# Patient Record
Sex: Female | Born: 1935 | Race: White | Hispanic: No | Smoking: Former smoker
Health system: Southern US, Community
[De-identification: ages and names within clinical notes are randomized; demographics above are authoritative.]

## PROBLEM LIST (undated history)

## (undated) DIAGNOSIS — F419 Anxiety disorder, unspecified: Secondary | ICD-10-CM

## (undated) DIAGNOSIS — D649 Anemia, unspecified: Secondary | ICD-10-CM

## (undated) DIAGNOSIS — K219 Gastro-esophageal reflux disease without esophagitis: Secondary | ICD-10-CM

## (undated) DIAGNOSIS — F0150 Vascular dementia without behavioral disturbance: Secondary | ICD-10-CM

## (undated) DIAGNOSIS — F32A Depression, unspecified: Secondary | ICD-10-CM

## (undated) DIAGNOSIS — Z9289 Personal history of other medical treatment: Secondary | ICD-10-CM

## (undated) DIAGNOSIS — I639 Cerebral infarction, unspecified: Secondary | ICD-10-CM

## (undated) DIAGNOSIS — I219 Acute myocardial infarction, unspecified: Secondary | ICD-10-CM

## (undated) DIAGNOSIS — F329 Major depressive disorder, single episode, unspecified: Secondary | ICD-10-CM

## (undated) HISTORY — PX: CATARACT EXTRACTION W/ INTRAOCULAR LENS IMPLANT: SHX1309

## (undated) HISTORY — PX: FRACTURE SURGERY: SHX138

## (undated) HISTORY — PX: VAGINAL HYSTERECTOMY: SUR661

---

## 2014-07-01 DIAGNOSIS — I639 Cerebral infarction, unspecified: Secondary | ICD-10-CM

## 2014-07-01 HISTORY — DX: Cerebral infarction, unspecified: I63.9

## 2014-07-27 ENCOUNTER — Encounter (HOSPITAL_COMMUNITY): Payer: Self-pay

## 2014-07-27 ENCOUNTER — Emergency Department (HOSPITAL_COMMUNITY): Payer: Medicare Other

## 2014-07-27 ENCOUNTER — Inpatient Hospital Stay (HOSPITAL_COMMUNITY)
Admission: EM | Admit: 2014-07-27 | Discharge: 2014-07-30 | DRG: 069 | Disposition: A | Discharge status: Home Health | Payer: Medicare Other | Attending: Family Medicine | Admitting: Family Medicine

## 2014-07-27 DIAGNOSIS — F0150 Vascular dementia without behavioral disturbance: Secondary | ICD-10-CM | POA: Diagnosis present

## 2014-07-27 DIAGNOSIS — I2510 Atherosclerotic heart disease of native coronary artery without angina pectoris: Secondary | ICD-10-CM | POA: Diagnosis present

## 2014-07-27 DIAGNOSIS — G8194 Hemiplegia, unspecified affecting left nondominant side: Secondary | ICD-10-CM | POA: Diagnosis present

## 2014-07-27 DIAGNOSIS — I252 Old myocardial infarction: Secondary | ICD-10-CM | POA: Diagnosis not present

## 2014-07-27 DIAGNOSIS — R001 Bradycardia, unspecified: Secondary | ICD-10-CM | POA: Diagnosis present

## 2014-07-27 DIAGNOSIS — M6289 Other specified disorders of muscle: Secondary | ICD-10-CM

## 2014-07-27 DIAGNOSIS — Z7982 Long term (current) use of aspirin: Secondary | ICD-10-CM

## 2014-07-27 DIAGNOSIS — G459 Transient cerebral ischemic attack, unspecified: Principal | ICD-10-CM | POA: Diagnosis present

## 2014-07-27 DIAGNOSIS — R531 Weakness: Secondary | ICD-10-CM

## 2014-07-27 DIAGNOSIS — Z66 Do not resuscitate: Secondary | ICD-10-CM | POA: Diagnosis present

## 2014-07-27 DIAGNOSIS — I359 Nonrheumatic aortic valve disorder, unspecified: Secondary | ICD-10-CM | POA: Diagnosis not present

## 2014-07-27 DIAGNOSIS — G451 Carotid artery syndrome (hemispheric): Secondary | ICD-10-CM

## 2014-07-27 LAB — HEMOGLOBIN A1C
Hgb A1c MFr Bld: 5.2 % (ref <5.7)
Mean Plasma Glucose: 103 mg/dL (ref <117)

## 2014-07-27 LAB — CBC WITH DIFFERENTIAL/PLATELET
BASOS ABS: 0 10*3/uL (ref 0.0–0.1)
Basophils Relative: 0 % (ref 0–1)
Eosinophils Absolute: 0.3 10*3/uL (ref 0.0–0.7)
Eosinophils Relative: 4 % (ref 0–5)
HEMATOCRIT: 38.1 % (ref 36.0–46.0)
Hemoglobin: 12.2 g/dL (ref 12.0–15.0)
LYMPHS ABS: 1.2 10*3/uL (ref 0.7–4.0)
LYMPHS PCT: 18 % (ref 12–46)
MCH: 29.3 pg (ref 26.0–34.0)
MCHC: 32 g/dL (ref 30.0–36.0)
MCV: 91.6 fL (ref 78.0–100.0)
MONO ABS: 0.5 10*3/uL (ref 0.1–1.0)
Monocytes Relative: 7 % (ref 3–12)
Neutro Abs: 4.6 10*3/uL (ref 1.7–7.7)
Neutrophils Relative %: 71 % (ref 43–77)
PLATELETS: 258 10*3/uL (ref 150–400)
RBC: 4.16 MIL/uL (ref 3.87–5.11)
RDW: 12.6 % (ref 11.5–15.5)
WBC: 6.5 10*3/uL (ref 4.0–10.5)

## 2014-07-27 LAB — COMPREHENSIVE METABOLIC PANEL
ALT: 10 U/L (ref 0–35)
ANION GAP: 14 (ref 5–15)
AST: 16 U/L (ref 0–37)
Albumin: 3.4 g/dL — ABNORMAL LOW (ref 3.5–5.2)
Alkaline Phosphatase: 102 U/L (ref 39–117)
BUN: 19 mg/dL (ref 6–23)
CO2: 22 meq/L (ref 19–32)
CREATININE: 0.82 mg/dL (ref 0.50–1.10)
Calcium: 9.5 mg/dL (ref 8.4–10.5)
Chloride: 106 mEq/L (ref 96–112)
GFR, EST NON AFRICAN AMERICAN: 80 mL/min — AB (ref >90)
GLUCOSE: 87 mg/dL (ref 70–99)
Potassium: 4.6 mEq/L (ref 3.7–5.3)
SODIUM: 142 meq/L (ref 137–147)
TOTAL PROTEIN: 6.9 g/dL (ref 6.0–8.3)
Total Bilirubin: 0.2 mg/dL — ABNORMAL LOW (ref 0.3–1.2)

## 2014-07-27 LAB — GLUCOSE, CAPILLARY
Glucose-Capillary: 89 mg/dL (ref 70–99)
Glucose-Capillary: 92 mg/dL (ref 70–99)

## 2014-07-27 LAB — PROTIME-INR
INR: 0.94 (ref 0.00–1.49)
Prothrombin Time: 12.7 seconds (ref 11.6–15.2)

## 2014-07-27 LAB — TROPONIN I

## 2014-07-27 MED ORDER — DIAZEPAM 5 MG PO TABS: 5.0000 mg | ORAL_TABLET | Freq: Three times a day (TID) | ORAL | Status: DC | PRN

## 2014-07-27 MED ORDER — ASPIRIN 325 MG PO TABS: 325.0000 mg | ORAL_TABLET | Freq: Every day | ORAL | Status: DC

## 2014-07-27 MED ORDER — LORAZEPAM 2 MG/ML IJ SOLN
1.0000 mg | Freq: Once | INTRAMUSCULAR | Status: AC
  Administered 2014-07-27: 1 mg via INTRAVENOUS
  Filled 2014-07-27: qty 1

## 2014-07-27 MED ORDER — HEPARIN SODIUM (PORCINE) 5000 UNIT/ML IJ SOLN
5000.0000 [IU] | Freq: Three times a day (TID) | INTRAMUSCULAR | Status: DC
  Administered 2014-07-27 – 2014-07-30 (×9): 5000 [IU] via SUBCUTANEOUS
  Filled 2014-07-27 (×9): qty 1

## 2014-07-27 MED ORDER — LORAZEPAM 2 MG/ML IJ SOLN: 1.0000 mg | Freq: Four times a day (QID) | INTRAMUSCULAR | Status: DC | PRN

## 2014-07-27 MED ORDER — HALOPERIDOL LACTATE 5 MG/ML IJ SOLN
2.0000 mg | Freq: Four times a day (QID) | INTRAMUSCULAR | Status: DC | PRN
  Administered 2014-07-27 – 2014-07-28 (×2): 2 mg via INTRAVENOUS
  Filled 2014-07-27 (×2): qty 1

## 2014-07-27 MED ORDER — LORAZEPAM 2 MG/ML IJ SOLN
0.5000 mg | Freq: Once | INTRAMUSCULAR | Status: AC
  Administered 2014-07-27: 0.5 mg via INTRAVENOUS

## 2014-07-27 MED ORDER — SODIUM CHLORIDE 0.9 % IV SOLN: INTRAVENOUS | Status: DC

## 2014-07-27 MED ORDER — ASPIRIN EC 81 MG PO TBEC
81.0000 mg | DELAYED_RELEASE_TABLET | Freq: Every day | ORAL | Status: DC
  Administered 2014-07-28 – 2014-07-30 (×3): 81 mg via ORAL
  Filled 2014-07-27 (×4): qty 1

## 2014-07-27 MED ORDER — ACETAMINOPHEN 325 MG PO TABS: 650.0000 mg | ORAL_TABLET | ORAL | Status: DC | PRN

## 2014-07-27 MED ORDER — TRAZODONE HCL 100 MG PO TABS
100.0000 mg | ORAL_TABLET | Freq: Three times a day (TID) | ORAL | Status: DC
  Administered 2014-07-28: 100 mg via ORAL
  Filled 2014-07-27: qty 1

## 2014-07-27 MED ORDER — LORAZEPAM 2 MG/ML IJ SOLN
0.5000 mg | Freq: Four times a day (QID) | INTRAMUSCULAR | Status: DC | PRN
  Administered 2014-07-27: 0.5 mg via INTRAVENOUS
  Filled 2014-07-27: qty 1

## 2014-07-27 MED ORDER — HYDRALAZINE HCL 20 MG/ML IJ SOLN: 5.0000 mg | Freq: Four times a day (QID) | INTRAMUSCULAR | Status: DC | PRN

## 2014-07-27 MED ORDER — STROKE: EARLY STAGES OF RECOVERY BOOK
Freq: Once | Status: AC
  Administered 2014-07-27: 20:00:00
  Filled 2014-07-27: qty 1

## 2014-07-27 NOTE — ED Provider Notes (Signed)
CSN: [id number]     Arrival date & time 07/27/14  0944 History   First MD Initiated Contact with Patient 07/27/14 [phone]     Chief Complaint  Patient presents with  . Weakness  . Stroke Symptoms     Level V caveat: Dementia  HPI Patient presents with difficulty walking last night as well as this morning.  Family noted this morning that her left side seemed weak.  No prior history of stroke.  They report her left side seems better now.  Patient has a history of dementia and therefore cannot provide significant history.  No recent illness.  No fevers or chills.  Eating and drinking normally.   History reviewed. No pertinent past medical history. No past surgical history on file. No family history on file. History  Substance Use Topics  . Smoking status: Not on file  . Smokeless tobacco: Not on file  . Alcohol Use: Not on file    Review of Systems  Unable to perform ROS     Allergies  Review of patient's allergies indicates no known allergies.  Home Medications   Prior to Admission medications   Medication Sig Start Date End Date Taking? Authorizing Provider  aspirin 325 MG tablet Take 325 mg by mouth daily.   Yes Historical Provider, MD  diazepam (VALIUM) 5 MG tablet Take 5 mg by mouth every 8 (eight) hours as needed for anxiety.   Yes Historical Provider, MD  traZODone (DESYREL) 50 MG tablet Take 100 mg by mouth 3 (three) times daily.   Yes Historical Provider, MD   BP 149/53 mmHg  Pulse 41  Resp 12  SpO2 100% Physical Exam  Constitutional: He appears well-developed and well-nourished.  HENT:  Head: Normocephalic and atraumatic.  No obvious facial droop  Eyes: EOM are normal.  Neck: Normal range of motion.  Cardiovascular: Normal rate, regular rhythm, normal heart sounds and intact distal pulses.   Pulmonary/Chest: Effort normal and breath sounds normal. No respiratory distress.  Abdominal: Soft. He exhibits no distension. There is no tenderness.   Musculoskeletal: He exhibits no edema or tenderness.  Grossly normal strength in her upper and lower extremity major muscle groups.  Neurological: He is alert.  Skin: Skin is warm and dry.  Psychiatric: He has a normal mood and affect. Judgment normal.  Nursing note and vitals reviewed.   ED Course  Procedures (including critical care time) Labs Review Labs Reviewed  CBC WITH DIFFERENTIAL - Abnormal; Notable for the following:    RBC 4.16 (*)    Hemoglobin 12.2 (*)    HCT 38.1 (*)    All other components within normal limits  COMPREHENSIVE METABOLIC PANEL - Abnormal; Notable for the following:    Albumin 3.4 (*)    Total Bilirubin 0.2 (*)    GFR calc non Af Amer 80 (*)    All other components within normal limits  TROPONIN I  PROTIME-INR    Imaging Review No results found.   EKG Interpretation   Date/Time:  Friday July 27 2014 09:59:03 EST Ventricular Rate:  48 PR Interval:  70 QRS Duration: 84 QT Interval:  502 QTC Calculation: 449 R Axis:   56 Text Interpretation:  Sinus bradycardia Short PR interval LVH by voltage  Artifact No old tracing to compare Confirmed by Emmerie Battaglia  MD, Caryn BeeKEVIN ([id number])  on 07/27/2014 11:17:34 AM      MDM   Final diagnoses:  Left-sided weakness  Transient cerebral ischemia, unspecified transient cerebral ischemia type  Family feels strongly the patient was more weak on the left side earlier today.  May represent TIA.  Labs, EKG, head CT.  Suspect neurology consultation and inpatient evaluation for TIA    Lyanne CoKevin M Jaisen Wiltrout, MD 07/27/14 1329

## 2014-07-27 NOTE — H&P (Signed)
Family Medicine [facility] Admission History and Physical Service Pager: [phone]  Patient name: Jill [name] Medical record number: [id number] Date of birth: 1929/03/24 Age: 80 y.o. Gender: female  Primary Care Provider: Jacqlyn KraussMargaret Brewer, NP in [place], [place] Consultants: Neurology  Code Status: DNR  Chief Complaint: Left-sided stiffness and weakness  Assessment and Plan: Jill [name] is a 80 y.o. female presenting with TIA versus seizure . PMH is significant for vascular dementia and hypertension which no longer requires medication.  TIA versus seizure - Symptoms consistent with TA versus partial seizure with Todd's paralysis - Neurology consult in the emergency department, appreciate recommendations - Admit to telemetry for close monitoring and TIA/seizure workup - Start aspirin, check risk labs including A1c, cholesterol panel, and TSH - RN bedside swallow screen - CT head with old tiny left parietal stroke, no acute findings - MRI/MRA brain, TTE, carotid Dopplers, EEG with concern for seizure - Allow for permissive hypertension to 220/120, when necessary hydralazine after that  Dementia, likely vascular type - Family states she has agitation and middle the day controlled with Valium and trazodone - Consider changing Valium to Ativan considering her age, but for now will continue Valium at home dose - Continue trazodone at home no  Hypertension - As above without for permissive hypertension - Patient has history of hypertension but has not needed medication for quite some time - Last measurement 161/77  Bradycardia, CAD - EKG with sinus bradycardia with short PR segment, likely artifact appearing to be repetitive PR's without QRS - Avoid AV nodal blocking agents, monitor on telemetry - History of "minor heart attack" - aspirin, likely statin but will wait for lipid panel considering age  FEN/GI: Normal saline at 75 mL/hr, heart healthy diet after swallow  screen Prophylaxis: Subcutaneous heparin  Disposition: Admit to neuro telemetry for TIA/seizure workup and close monitoring.  History of Present Illness: Jill [name] is a 80 y.o. female presenting with left-sided weakness after a period of left arm and left neck stiffness this morning. Her daughter is in the room who explains her history for her. Patient has advanced dementia and is unable to verbally communicate. Her daughter explains that yesterday was a normal day for her except for an event last night. She states last night she found her mother at the top of the stairs unable to walk, she felt this might be due to exhaustion due to family events yesterday. This morning when she stepped out of bed she was very weak and could not walk. Her daughter helped her to the toilet which was very difficult. On the toilet she noticed that she developed left neck stiffness and left arm stiffness which lasted 5-10 minutes. She helped her to the couch and EMS was called.  Her left-sided stiffness progressed to weakness. She denies any loss of consciousness or change in behavior. She states that when EMS arrived her left side was still weak. Throughout her ER visit this morning her left-sided weakness has slowly improved and now appears to be resolved.  She denies recent fevers or chills. She states that she normally eats and drinks easily but has had decreased by mouth intake for the last day or so with no fluids today. She was [facility] 3 years [facility] for "minor heart attack". She was started on metoprolol that time and had severe bradycardia then, causing its discontinuation.  Review Of Systems: Per HPI, Otherwise 12 point review of systems was performed and was unremarkable.  Patient Active Problem List   Diagnosis Date Noted  . TIA (transient ischemic attack) 07/27/2014   Past Medical History: History reviewed. No pertinent past medical history. Past Surgical History: No past surgical  history on file. Social History: History  Substance Use Topics  . Smoking status: Not on file  . Smokeless tobacco: Not on file  . Alcohol Use: Not on file   Additional social history: Please also refer to relevant sections of EMR.  Family History: No family history on file. Allergies and Medications: No Known Allergies No current facility-administered medications on file prior to encounter.   No current outpatient prescriptions on file prior to encounter.    Objective: BP 154/60 mmHg  Pulse 47  Resp 15  SpO2 98% Exam: Gen: NAD, alert  HEENT: NCAT, MMM but difficult to assess due to patient's lack of interaction CV: RRR, good S1/S2, no murmur Resp: Clear on ant lung fields but decreased breath sounds due to limited exam due to pts' inability to participate GI: SNTND, BS present, no guarding or organomegaly Ext: No edema, warm, 1+ DP pulses Neuro: Alert , cannot follow commands, normal tone BL UE, dropping both arms the patient has similar control BL   Labs and Imaging: CBC BMET   Recent Labs Lab 07/27/14 1154  WBC 6.5  HGB 12.2*  HCT 38.1*  PLT 258    Recent Labs Lab 07/27/14 1154  NA 142  K 4.6  CL 106  CO2 22  BUN 19  CREATININE 0.82  GLUCOSE 87  CALCIUM 9.5     CT head 07/27/2014 Atrophy with small vessel chronic ischemic changes of deep cerebral white matter. Tiny old LEFT parietal cortical infarct. No acute intracranial abnormalities.  EKG 07/27/2014: with sinus bradycardia with short PR segment, likely artifact appearing to be repetitive PR's without QRS  Elenora GammaSamuel L Bradshaw, MD 07/27/2014, 2:34 PM PGY-3, [facility] FPTS Intern pager: [phone], text pages welcome

## 2014-07-27 NOTE — ED Notes (Signed)
Last night ~ 10 pm - difficulty walking - abnormally falling toward rt. Woke up this am still having difficulty walking and not talkitive. Hx. Of dementia. Ems exam; equal strengths, no dysarthia, min. Rt. Facial droop, aphagia. bp 150-160/90's, hr. 50's. cbg 87.  No uti symptoms.

## 2014-07-27 NOTE — ED Notes (Signed)
Pt removed her IV, restarted in same area.

## 2014-07-27 NOTE — Consult Note (Signed)
Referring Physician: Dr. Venora Maples    Chief Complaint: transient left sided weakness, difficulty speaking  HPI:                                                                                                                                         [place] is an 80 y.o. female with dementia who according to her daughter was in her usual state of health until last night when she was noted to have some difficulty speaking. Then, this morning she also had trouble talking and when she went to the toilet couldn't sit on the toilet. Daughter said that she was not able to walk and her left side became" stiff for a couple of minutes and also weak for a longer period of time". She gave her mother an aspirin. Patient has very limited verbal output product of her underlying dementia and thus can not contribute to her history. Daughter tells me that at this moment she seems to be at her baseline, moving the left side without problem.  Date last known well: 11/26 Time last known well: uncertain tPA Given: no, symptoms resolved   History reviewed. No pertinent past medical history.  No past surgical history on file.  No family history on file. Social History:  has no tobacco, alcohol, and drug history on file.  Allergies: No Known Allergies  Medications:                                                                                                                           Scheduled:  ROS: unable to obtain due to dementia                                                                                                                 History obtained from chart review and daughter    Physical exam: pleasant elderly female in no apparent distress. Blood  pressure 154/60, pulse 47, resp. rate 15, SpO2 98 %. Head: normocephalic. Neck: supple, no bruits, no JVD. Cardiac: no murmurs. Lungs: clear. Abdomen: soft, no tender, no mass. Extremities: no edema. Neurologic Examination:                                                                                                       General: Mental Status: Alert and awake but doesn't follows commands and essentially is non verbal. Cranial Nerves: II: Discs flat bilaterally; Visual fields grossly normal, pupils equal, round, reactive to light III,IV, VI: ptosis not present, extra-ocular motions intact bilaterally V,VII: smile symmetric, facial light touch sensation normal bilaterally VIII: hearing grossly normal bilaterally IX,X: gag reflex present XI: bilateral shoulder shrug intact XII: midline tongue extension without atrophy or fasciculations Motor: Moves all limbs symmetrically Sensory: reacts to noxious stimuli Deep Tendon Reflexes:  1 all over Plantars: Right: downgoing   Left: downgoing Cerebellar: Unable to test due to patient inability to follow commands Gait:  Unable to test    Results for orders placed or performed during the hospital encounter of 07/27/14 (from the past 48 hour(s))  CBC with Differential     Status: Abnormal   Collection Time: 07/27/14 11:54 AM  Result Value Ref Range   WBC 6.5 4.0 - 10.5 K/uL   RBC 4.16 (L) 4.22 - 5.81 MIL/uL   Hemoglobin 12.2 (L) 13.0 - 17.0 g/dL   HCT 38.1 (L) 39.0 - 52.0 %   MCV 91.6 78.0 - 100.0 fL   MCH 29.3 26.0 - 34.0 pg   MCHC 32.0 30.0 - 36.0 g/dL   RDW 12.6 11.5 - 15.5 %   Platelets 258 150 - 400 K/uL   Neutrophils Relative % 71 43 - 77 %   Neutro Abs 4.6 1.7 - 7.7 K/uL   Lymphocytes Relative 18 12 - 46 %   Lymphs Abs 1.2 0.7 - 4.0 K/uL   Monocytes Relative 7 3 - 12 %   Monocytes Absolute 0.5 0.1 - 1.0 K/uL   Eosinophils Relative 4 0 - 5 %   Eosinophils Absolute 0.3 0.0 - 0.7 K/uL   Basophils Relative 0 0 - 1 %   Basophils Absolute 0.0 0.0 - 0.1 K/uL  Comprehensive metabolic panel     Status: Abnormal   Collection Time: 07/27/14 11:54 AM  Result Value Ref Range   Sodium 142 137 - 147 mEq/L   Potassium 4.6 3.7 - 5.3 mEq/L   Chloride 106 96 - 112 mEq/L    CO2 22 19 - 32 mEq/L   Glucose, Bld 87 70 - 99 mg/dL   BUN 19 6 - 23 mg/dL   Creatinine, Ser 0.82 0.50 - 1.35 mg/dL   Calcium 9.5 8.4 - 10.5 mg/dL   Total Protein 6.9 6.0 - 8.3 g/dL   Albumin 3.4 (L) 3.5 - 5.2 g/dL   AST 16 0 - 37 U/L   ALT 10 0 - 53 U/L   Alkaline Phosphatase 102 39 - 117 U/L   Total Bilirubin 0.2 (L) 0.3 - 1.2 mg/dL   GFR  calc non Af Amer 80 (L) >90 mL/min   GFR calc Af Amer >90 >90 mL/min    Comment: (NOTE) The eGFR has been calculated using the CKD EPI equation. This calculation has not been validated in all clinical situations. eGFR's persistently <90 mL/min signify possible Chronic Kidney Disease.    Anion gap 14 5 - 15  Troponin I     Status: None   Collection Time: 07/27/14 11:54 AM  Result Value Ref Range   Troponin I <0.30 <0.30 ng/mL    Comment:        Due to the release kinetics of cTnI, a negative result within the first hours of the onset of symptoms does not rule out myocardial infarction with certainty. If myocardial infarction is still suspected, repeat the test at appropriate intervals.    Ct Head Wo Contrast  07/27/2014   CLINICAL DATA:  New LEFT-sided weakness per patient's daughter, questionable TIA, history Alzheimer's ; patient unable to provide additional history, noncommunicative  EXAM: CT HEAD WITHOUT CONTRAST  TECHNIQUE: Contiguous axial images were obtained from the base of the skull through the vertex without intravenous contrast.  COMPARISON:  None  FINDINGS: Generalized atrophy.  Normal ventricular morphology.  No midline shift or mass effect.  Small vessel chronic ischemic changes of deep cerebral white matter.  Tiny old cortical infarct at high LEFT parietal lobe.  No intracranial hemorrhage, mass lesion or evidence acute infarction.  No extra-axial fluid collections.  Extensive atherosclerotic calcifications of BILATERAL internal carotid arteries at carotid siphons.  Bones demineralized.  Minimal scattered mucosal thickening of the  paranasal sinuses.  IMPRESSION: Atrophy with small vessel chronic ischemic changes of deep cerebral white matter.  Tiny old LEFT parietal cortical infarct.  No acute intracranial abnormalities.   Electronically Signed   By: Lavonia Dana M.D.   On: 07/27/2014 11:39    Assessment: 80 y.o. female with advanced dementia, brought in after sustaining transient language impairment x 2 and stiffness/weakness left side. Probable right brain TIA. Focal seizure also in the differential. Will suggest admission to medicine and get EEG, TIA work up. Stroke team will follow up in the morning  Stroke Risk Factors -age  Plan: 1. HgbA1c, fasting lipid panel 2. MRI, MRA  of the brain without contrast 3. Echocardiogram 4. Carotid dopplers 5. Prophylactic therapy-aspirin after passing swallowing evaluation 6. Risk factor modification 7. Telemetry monitoring 8. Frequent neuro checks 9. PT/OT SLP (no needed at this time)  Dorian Pod, MD [facility] [phone]  07/27/2014, 2:14 PM

## 2014-07-27 NOTE — Progress Notes (Signed)
MD notified that patient had been trying to climb out of bed, is impulsive and lacks understanding.  MD ordered 0.5 mg of Ativan IV.  At this time, a sitter is unavailable this RN will sit with patient until she settles.  Will continue to monitor patient.

## 2014-07-28 ENCOUNTER — Inpatient Hospital Stay (HOSPITAL_COMMUNITY): Payer: Medicare Other

## 2014-07-28 DIAGNOSIS — R531 Weakness: Secondary | ICD-10-CM | POA: Insufficient documentation

## 2014-07-28 DIAGNOSIS — F0150 Vascular dementia without behavioral disturbance: Secondary | ICD-10-CM | POA: Insufficient documentation

## 2014-07-28 DIAGNOSIS — F0151 Vascular dementia with behavioral disturbance: Secondary | ICD-10-CM

## 2014-07-28 DIAGNOSIS — I359 Nonrheumatic aortic valve disorder, unspecified: Secondary | ICD-10-CM

## 2014-07-28 LAB — LIPID PANEL
CHOL/HDL RATIO: 2.7 ratio
Cholesterol: 232 mg/dL — ABNORMAL HIGH (ref 0–200)
HDL: 85 mg/dL (ref >39)
LDL CALC: 130 mg/dL — AB (ref 0–99)
TRIGLYCERIDES: 87 mg/dL (ref <150)
VLDL: 17 mg/dL (ref 0–40)

## 2014-07-28 LAB — RAPID URINE DRUG SCREEN, HOSP PERFORMED
Amphetamines: NOT DETECTED
Barbiturates: NOT DETECTED
Benzodiazepines: POSITIVE — AB
Cocaine: NOT DETECTED
OPIATES: NOT DETECTED
Tetrahydrocannabinol: NOT DETECTED

## 2014-07-28 LAB — GLUCOSE, CAPILLARY
Glucose-Capillary: 87 mg/dL (ref 70–99)
Glucose-Capillary: 99 mg/dL (ref 70–99)
Glucose-Capillary: 103 mg/dL — ABNORMAL HIGH (ref 70–99)
Glucose-Capillary: 121 mg/dL — ABNORMAL HIGH (ref 70–99)

## 2014-07-28 MED ORDER — WHITE PETROLATUM GEL
Status: AC
  Administered 2014-07-28: 05:00:00
  Filled 2014-07-28: qty 5

## 2014-07-28 MED ORDER — DIAZEPAM 2 MG PO TABS
2.0000 mg | ORAL_TABLET | Freq: Three times a day (TID) | ORAL | Status: DC | PRN
  Administered 2014-07-28 – 2014-07-30 (×2): 2 mg via ORAL
  Filled 2014-07-28 (×2): qty 1

## 2014-07-28 MED ORDER — TRAZODONE HCL 100 MG PO TABS: 100.0000 mg | ORAL_TABLET | Freq: Three times a day (TID) | ORAL | Status: DC

## 2014-07-28 MED ORDER — ATORVASTATIN CALCIUM 10 MG PO TABS
20.0000 mg | ORAL_TABLET | Freq: Every day | ORAL | Status: DC
  Administered 2014-07-28 – 2014-07-29 (×2): 20 mg via ORAL
  Filled 2014-07-28 (×2): qty 2

## 2014-07-28 MED ORDER — DIAZEPAM 5 MG PO TABS: 5.0000 mg | ORAL_TABLET | Freq: Three times a day (TID) | ORAL | Status: DC | PRN

## 2014-07-28 MED ORDER — TRAZODONE HCL 100 MG PO TABS
100.0000 mg | ORAL_TABLET | Freq: Three times a day (TID) | ORAL | Status: DC
  Administered 2014-07-28 – 2014-07-29 (×3): 100 mg via ORAL
  Filled 2014-07-28 (×3): qty 1

## 2014-07-28 NOTE — Progress Notes (Signed)
STROKE TEAM PROGRESS NOTE   HISTORY Jill [name] is an 80 y.o. female with dementia who according to her daughter was in her usual state of health until last night when she was noted to have some difficulty speaking. Then, this morning she also had trouble talking and when she went to the toilet couldn't sit on the toilet. Daughter said that she was not able to walk and her left side became" stiff for a couple of minutes and also weak for a longer period of time". She gave her mother an aspirin. Patient has very limited verbal output product of her underlying dementia and thus can not contribute to her history. Daughter tells me that at this moment she seems to be at her baseline, moving the left side without problem.  Date last known well: 11/26 Time last known well: uncertain tPA Given: no, symptoms resolved     SUBJECTIVE (INTERVAL HISTORY) Family is at the bedside today. She has vascular dementia and is limited at baseline.  Symptoms appear to have resolved. 2D echo and EEG completed, pending read. She has been on Valium and Trazodone  for behavioral problems.   OBJECTIVE Temp:  [97.4 F (36.3 C)-98.5 F (36.9 C)] 97.4 F (36.3 C) (11/28 0955) Pulse Rate:  [43-75] 52 (11/28 0955) Cardiac Rhythm:  [-] Normal sinus rhythm (11/27 2003) Resp:  [11-16] 15 (11/28 0955) BP: (92-183)/(53-98) 92/53 mmHg (11/28 0955) SpO2:  [97 %-100 %] 100 % (11/28 0955)   Recent Labs Lab 07/27/14 1641 07/27/14 2154 07/28/14 0648  GLUCAP 89 92 87    Recent Labs Lab 07/27/14 1154  NA 142  K 4.6  CL 106  CO2 22  GLUCOSE 87  BUN 19  CREATININE 0.82  CALCIUM 9.5    Recent Labs Lab 07/27/14 1154  AST 16  ALT 10  ALKPHOS 102  BILITOT 0.2*  PROT 6.9  ALBUMIN 3.4*    Recent Labs Lab 07/27/14 1154  WBC 6.5  NEUTROABS 4.6  HGB 12.2  HCT 38.1  MCV 91.6  PLT 258    Recent Labs Lab 07/27/14 1154  TROPONINI <0.30   No results for input(s): LABPROT, INR in the last 72  hours. No results for input(s): COLORURINE, LABSPEC, PHURINE, GLUCOSEU, HGBUR, BILIRUBINUR, KETONESUR, PROTEINUR, UROBILINOGEN, NITRITE, LEUKOCYTESUR in the last 72 hours.  Invalid input(s): APPERANCEUR     Component Value Date/Time   CHOL 232* 07/28/2014 0000   TRIG 87 07/28/2014 0000   HDL 85 07/28/2014 0000   CHOLHDL 2.7 07/28/2014 0000   VLDL 17 07/28/2014 0000   LDLCALC 130* 07/28/2014 0000   Lab Results  Component Value Date   HGBA1C 5.2 07/27/2014   No results found for: LABOPIA, COCAINSCRNUR, LABBENZ, AMPHETMU, THCU, LABBARB  No results for input(s): ETH in the last 168 hours.  Ct Head Wo Contrast 07/27/2014    Atrophy with small vessel chronic ischemic changes of deep cerebral white matter.  Tiny old LEFT parietal cortical infarct.  No acute intracranial abnormalities.     MRI/MRA Right vertebral dominant. Both vertebral arteries patent to the basilar. PICA patent bilaterally. Basilar widely patent. Mild atherosclerotic disease in the superior cerebellar and posterior cerebral arteries bilaterally without significant stenosis.  Mild atherosclerotic disease in the cavernous carotid bilaterally. Mild atherosclerotic disease in the anterior and middle cerebral arteries bilaterally without significant stenosis.  Negative for cerebral aneurysm.  IMPRESSION: Advanced atrophy. Mild chronic microvascular ischemic change  No acute abnormality  Mild intracranial atherosclerotic disease.  2D Echo:  - Moderate  LVH with LVEF 55-60%, grade 1 diastolic dysfunction. Severe posterior MAC with trivial mitral regurgitation. Mild aortic regurgitation. No PFO or ASD. Unable to assess PASP.  EEG - pending   PHYSICAL EXAM  PHYSICAL EXAM Physical exam: Exam: Gen: NAD Eyes: anicteric sclerae, moist conjunctivae                    CV: RRR, no MRG Mental Status: Not alert, grimaces to sternal rub, does not open eyes to command  Neuro: Detailed Neurologic  Exam  Speech:    Non-verbal  Cranial Nerves:    The pupils are equal, round, and reactive to light. Conjugate gaze. Resists eye opening. Could not visualize fundi. Face symmetric.  Motor Observation:    no involuntary movements noted.    Strength:    Moves all limbs symmetrically, appears anti gravity but difficult to assess as not following commands   Sensory: Grimaces and withdraws to pain in all extremities    DTRs: Symmetric bilat    Toes: Downgoing bilat      Gait: Unable to test       ASSESSMENT/PLAN Jill [name] is a 80 y.o. female with history of dementia, hypertension, coronary artery disease, and bradycardia, presenting with difficulty speaking and left hemiparesis. She did not receive IV t-PA due to resolution of deficits.   TIA:  Non-dominant   Resultant  resolution of deficits  MRI  As above  MRA  As above  Carotid Doppler pending  2D Echo  - as above  EEG - pending  LDL 130  HgbA1c 5.2  Subcutaneous heparin for VTE prophylaxis  Diet regular with thin liquids  aspirin 325 mg orally every day prior to admission, now on aspirin 81 mg orally every day  Ongoing aggressive stroke risk factor management  Therapy recommendations:  Pending  Disposition:  Pending  Hypertension  Home meds:  No antihypertensive medications prior to admission.  Stable but low at times 92/53  Hyperlipidemia  Home meds: No lipid lowering medications prior to admission  LDL 132, goal < 70  Now on Lipitor 20 mg daily  Continue statin at discharge  Other Stroke Risk Factors  Advanced age  Hx stroke/TIA  Coronary artery disease    Other Active Problems  Dementia  Other Pertinent History HgbA1c 5.2, not diabetic  Hospital day # 1  Jill Seeavid Rinehuls PA-C [facility] Pager [phone] 07/28/2014, 11:32 AM  Personally examined patient and images, agree with history, physical, neuro exam as stated above. Agree with assessment  and plan.   Jill DeanAntonia Alayla Dethlefs, MD [facility]     To contact Stroke Continuity provider, please refer to [email or web address]. After hours, contact General Neurology

## 2014-07-28 NOTE — Progress Notes (Signed)
EEG completed; results pending.    

## 2014-07-28 NOTE — Progress Notes (Signed)
Family Medicine Teaching Service Daily Progress Note Intern Pager: [phone]  Patient name: Jill HazardVirginia Soberano Medical record number: [id number] Date of birth: December 16, 1928 Age: 80 y.o. Gender: female  Primary Care Provider: No PCP Per Patient Consultants: Neurology Code Status: DNR  Assessment and Plan: Jill [name] is a 80 y.o. female presenting with TIA versus seizure . PMH is significant for vascular dementia and hypertension which no longer requires medication.  TIA versus seizure - Symptoms consistent with TIA versus partial seizure with Todd's paralysis - Neurology consult in the emergency department, appreciate recommendations - continue aspirin, Start lipitor - LDL 130, A1c 5.2 - SLP swallow eval - passed - CT head with old tiny left parietal stroke, no acute findings - MRI/MRA brain, TTE, carotid Dopplers, EEG with concern for seizure - Allow for permissive hypertension to 220/120, PRN hydralazine after that  Dementia, likely vascular type - Family states she has agitation in the middle of the day controlled with Valium and trazodone - Consider changing Valium to Ativan considering her age, but for now will continue Valium at home dose (used IV ativan overnight as she didn't pass her initial swallow screen) - Continue trazodone at home dose  Hypertension - Patient has history of hypertension but has not needed medication for quite some time - Last measurement 130/80  Bradycardia, CAD - EKG with sinus bradycardia with short PR segment, likely artifact appearing to be repetitive PR's without QRS - Avoid AV nodal blocking agents, monitor on telemetry - History of "minor heart attack" - aspirin, statin  FEN/GI: Normal saline at 75 mL/hr, heart healthy diet after swallow screen Prophylaxis: Subcutaneous heparin  Disposition: Continue neuro-tele for eval and monitoring.   Subjective:  No changes overnight, some agitation but patient slept well and is doing well now.    Objective: Temp:  [97.4 F (36.3 C)-98.5 F (36.9 C)] 98.5 F (36.9 C) (11/28 0532) Pulse Rate:  [41-75] 73 (11/28 0532) Resp:  [11-16] 16 (11/28 0532) BP: (101-183)/(53-98) 130/80 mmHg (11/28 0532) SpO2:  [97 %-100 %] 100 % (11/28 0532) Physical Exam: Gen: NAD, alert  HEENT: NCAT, CV: RRR, good S1/S2, no murmur Resp: Clear on ant lung fields but decreased breath sounds due to limited exam due to pts' inability to participate GI: SNTND, BS present, no guarding or organomegaly Ext: No edema, warm Neuro: Alert , cannot follow commands, normal tone BL UE, using BL UE to drink this am  Laboratory:  Recent Labs Lab 07/27/14 1154  WBC 6.5  HGB 12.2  HCT 38.1  PLT 258    Recent Labs Lab 07/27/14 1154  NA 142  K 4.6  CL 106  CO2 22  BUN 19  CREATININE 0.82  CALCIUM 9.5  PROT 6.9  BILITOT 0.2*  ALKPHOS 102  ALT 10  AST 16  GLUCOSE 87    TCholesterol 232, LDL 130 A1C 5.2    Recent Labs Lab 07/27/14 1154  TROPONINI <0.30     Imaging/Diagnostic Tests: CT head 07/27/2014 Atrophy with small vessel chronic ischemic changes of deep cerebral white matter. Tiny old LEFT parietal cortical infarct. No acute intracranial abnormalities.  EKG 07/27/2014: with sinus bradycardia with short PR segment, likely artifact appearing to be repetitive PR's without QRS  Elenora GammaSamuel L Keriana Sarsfield, MD 07/28/2014, 9:13 AM PGY-3, [facility] FPTS Intern pager: [phone], text pages welcome

## 2014-07-28 NOTE — Progress Notes (Signed)
  Echocardiogram 2D Echocardiogram has been performed.  Estelle GrumblesMyers, Harvest Stanco J 07/28/2014, 10:06 AM

## 2014-07-28 NOTE — Progress Notes (Signed)
OT Cancellation Note  Patient Details Name: [name] MRN: [id number] DOB: [birth year]   Cancelled Treatment:    Reason Eval/Treat Not Completed: Other (comment) Per PT, pt sedated for MRI this am and nursing requests to hold right now. Will try back later time.  Lennox LaityStone, [name]  [phone] 07/28/2014, 1:31 PM

## 2014-07-28 NOTE — Progress Notes (Addendum)
VASCULAR LAB PRELIMINARY  PRELIMINARY  PRELIMINARY  PRELIMINARY  Carotid duplex completed.    Preliminary report:  Technically difficult due to movement and patient condition. right:  40-59% internal carotid artery stenosis. Vertebral artery flow is antegrade. Left -  1-39% ICA stenosis.  Vertebral artery flow is antegrade.     Delvis Kau, Tiesha, RVS 07/28/2014, 3:43 PM

## 2014-07-28 NOTE — Progress Notes (Signed)
PT Cancellation Note  Patient Details Name: Jill [name] MRN: [id number] DOB: 01/02/1929   Cancelled Treatment:    Reason Eval/Treat Not Completed: Medical issues which prohibited therapy;Patient at procedure or test/unavailable.  RN reports patient sedated for MRI today.  Will return at later time for PT evaluation.   [name], [name] 07/28/2014, 1:13 PM [name], PT, [facility] Acute Rehab Services Pager [phone]

## 2014-07-28 NOTE — Plan of Care (Signed)
Problem: Consults Goal: Diabetes Guidelines if Diabetic/Glucose > 140 If diabetic or lab glucose is > 140 mg/dl - Initiate Diabetes/Hyperglycemia Guidelines & Document Interventions  Outcome: Not Applicable Date Met:  07/28/14     

## 2014-07-28 NOTE — Plan of Care (Signed)
Problem: Consults Goal: Skin Care Protocol Initiated - if Braden Score 18 or less If consults are not indicated, leave blank or document N/A  Outcome: Not Applicable Date Met:  52/17/47

## 2014-07-28 NOTE — Plan of Care (Signed)
Problem: Consults Goal: Ischemic Stroke Patient Education See Patient Education Module for education specifics.  Outcome: Completed/Met Date Met:  07/28/14

## 2014-07-28 NOTE — Evaluation (Signed)
Clinical/Bedside Swallow Evaluation Patient Details  Name: Jill [name] MRN: [id number] Date of Birth: 1929-08-03  Today's Date: 07/28/2014 Time: 0809-0824 SLP Time Calculation (min) (ACUTE ONLY): 15 min  Past Medical History: History reviewed. No pertinent past medical history. Past Surgical History: No past surgical history on file. HPI:  Jill [name] is an 80 y.o. female with dementia who according to her daughter was in her usual state of health until last night when she was noted to have some difficulty speaking. Then, this morning she also had trouble talking and when she went to the toilet couldn't sit on the toilet. Daughter said that she was not able to walk and her left side became" stiff for a couple of minutes and also weak for a longer period of time". MD suspects TIA, no MRI yet, CT negative.    Assessment / Plan / Recommendation Clinical Impression  Pt demonstrates a mild cognitive based dysphagia, likely consistent with her baseline function. No evidence of aspiration was observed. There is piecemeal oral transit of thin boluses with multiple swallow for each sip and brief oral holding; Pt also has prolonged mastication of pureed foods. Mastication of solids with adequate. Recommend pt resume a regular texture diet with thin liquids with supervision and assist for feeding as needed. No SLP f/u needed at this time.     Aspiration Risk  Mild    Diet Recommendation Regular;Thin liquid   Liquid Administration via: Cup;Straw Medication Administration: Whole meds with liquid Supervision: Staff to assist with self feeding;Full supervision/cueing for compensatory strategies Compensations: Slow rate;Small sips/bites Postural Changes and/or Swallow Maneuvers: Seated upright 90 degrees    Other  Recommendations Oral Care Recommendations: Oral care BID   Follow Up Recommendations  None    Frequency and Duration        Pertinent Vitals/Pain NA    SLP Swallow Goals      Swallow Study Prior Functional Status       General HPI: Jill [name] is an 80 y.o. female with dementia who according to her daughter was in her usual state of health until last night when she was noted to have some difficulty speaking. Then, this morning she also had trouble talking and when she went to the toilet couldn't sit on the toilet. Daughter said that she was not able to walk and her left side became" stiff for a couple of minutes and also weak for a longer period of time". MD suspects TIA, no MRI yet, CT negative.  Type of Study: Bedside swallow evaluation Previous Swallow Assessment: none Diet Prior to this Study: NPO Temperature Spikes Noted: No Respiratory Status: Room air History of Recent Intubation: No Behavior/Cognition: Alert;Doesn't follow directions Oral Cavity - Dentition: Adequate natural dentition Self-Feeding Abilities: Needs assist Patient Positioning: Upright in bed Baseline Vocal Quality: Low vocal intensity Volitional Cough: Cognitively unable to elicit Volitional Swallow: Unable to elicit    Oral/Motor/Sensory Function Overall Oral Motor/Sensory Function: Other (comment) (does not f/c, no focal weakness observed)   Ice Chips     Thin Liquid Thin Liquid: Impaired Presentation: Cup;Straw;Self Fed Oral Phase Impairments: Impaired anterior to posterior transit Oral Phase Functional Implications: Prolonged oral transit Pharyngeal  Phase Impairments: Multiple swallows    Nectar Thick Nectar Thick Liquid: Not tested   Honey Thick Honey Thick Liquid: Not tested   Puree Puree: Impaired Presentation: Spoon Oral Phase Impairments: Impaired anterior to posterior transit Oral Phase Functional Implications: Prolonged oral transit   Solid   GO    Solid:  Within functional limits      [facility] Surgery Center PcBonnie Zarie Kosiba, MA CCC-SLP [phone]  Claudine MoutonDeBlois, Keimya Briddell Caroline 07/28/2014,8:28 AM

## 2014-07-29 DIAGNOSIS — R531 Weakness: Secondary | ICD-10-CM | POA: Insufficient documentation

## 2014-07-29 LAB — GLUCOSE, CAPILLARY
Glucose-Capillary: 128 mg/dL — ABNORMAL HIGH (ref 70–99)
Glucose-Capillary: 103 mg/dL — ABNORMAL HIGH (ref 70–99)
Glucose-Capillary: 113 mg/dL — ABNORMAL HIGH (ref 70–99)
Glucose-Capillary: 122 mg/dL — ABNORMAL HIGH (ref 70–99)

## 2014-07-29 LAB — TSH: TSH: 2.33 u[IU]/mL (ref 0.350–4.500)

## 2014-07-29 MED ORDER — HALOPERIDOL 1 MG PO TABS
0.5000 mg | ORAL_TABLET | Freq: Four times a day (QID) | ORAL | Status: DC | PRN
  Administered 2014-07-30: 0.5 mg via ORAL
  Filled 2014-07-29: qty 1

## 2014-07-29 MED ORDER — TRAZODONE HCL 100 MG PO TABS: 100.0000 mg | ORAL_TABLET | Freq: Every evening | ORAL | Status: DC | PRN

## 2014-07-29 MED ORDER — TRAZODONE HCL 100 MG PO TABS: 100.0000 mg | ORAL_TABLET | Freq: Two times a day (BID) | ORAL | Status: DC | PRN

## 2014-07-29 NOTE — Progress Notes (Signed)
STROKE TEAM PROGRESS NOTE   HISTORY Jill [name] is an 80 y.o. female with dementia who according to her daughter was in her usual state of health until last night when she was noted to have some difficulty speaking. Then, this morning she also had trouble talking and when she went to the toilet couldn't sit on the toilet. Daughter said that she was not able to walk and her left side became" stiff for a couple of minutes and also weak for a longer period of time". She gave her mother an aspirin. Patient has very limited verbal output product of her underlying dementia and thus can not contribute to her history. Daughter tells me that at this moment she seems to be at her baseline, moving the left side without problem.  Date last known well: 11/26 Time last known well: uncertain tPA Given: no, symptoms resolved     SUBJECTIVE (INTERVAL HISTORY): No events overnight. Family at bedside. PT at bedside. She has vascular dementia and is limited at baseline. Symptoms appear to have resolved. She has been on Valium and Trazodone for behavioral problems.     OBJECTIVE Temp:  [87.7 F (30.9 C)-98.4 F (36.9 C)] 97.5 F (36.4 C) (11/29 0920) Pulse Rate:  [43-71] 57 (11/29 0920) Cardiac Rhythm:  [-] Sinus bradycardia (11/29 0600) Resp:  [14-17] 16 (11/29 0920) BP: (99-150)/(41-82) 108/41 mmHg (11/29 0920) SpO2:  [93 %-100 %] 93 % (11/29 0920)   Recent Labs Lab 07/28/14 0648 07/28/14 1104 07/28/14 1629 07/28/14 2111 07/29/14 0633  GLUCAP 87 99 103* 121* 128*    Recent Labs Lab 07/27/14 1154  NA 142  K 4.6  CL 106  CO2 22  GLUCOSE 87  BUN 19  CREATININE 0.82  CALCIUM 9.5    Recent Labs Lab 07/27/14 1154  AST 16  ALT 10  ALKPHOS 102  BILITOT 0.2*  PROT 6.9  ALBUMIN 3.4*    Recent Labs Lab 07/27/14 1154  WBC 6.5  NEUTROABS 4.6  HGB 12.2  HCT 38.1  MCV 91.6  PLT 258    Recent Labs Lab 07/27/14 1154  TROPONINI <0.30   No results for input(s): LABPROT,  INR in the last 72 hours. No results for input(s): COLORURINE, LABSPEC, PHURINE, GLUCOSEU, HGBUR, BILIRUBINUR, KETONESUR, PROTEINUR, UROBILINOGEN, NITRITE, LEUKOCYTESUR in the last 72 hours.  Invalid input(s): APPERANCEUR     Component Value Date/Time   CHOL 232* 07/28/2014 0000   TRIG 87 07/28/2014 0000   HDL 85 07/28/2014 0000   CHOLHDL 2.7 07/28/2014 0000   VLDL 17 07/28/2014 0000   LDLCALC 130* 07/28/2014 0000   Lab Results  Component Value Date   HGBA1C 5.2 07/27/2014      Component Value Date/Time   LABOPIA NONE DETECTED 07/28/2014 1340   COCAINSCRNUR NONE DETECTED 07/28/2014 1340   LABBENZ POSITIVE* 07/28/2014 1340   AMPHETMU NONE DETECTED 07/28/2014 1340   THCU NONE DETECTED 07/28/2014 1340   LABBARB NONE DETECTED 07/28/2014 1340    No results for input(s): ETH in the last 168 hours.  Ct Head Wo Contrast 07/27/2014    Atrophy with small vessel chronic ischemic changes of deep cerebral white matter.  Tiny old LEFT parietal cortical infarct.  No acute intracranial abnormalities.     MRI/MRA. 07/28/2014 Advanced atrophy. Mild chronic microvascular ischemic change No acute abnormality Mild intracranial atherosclerotic disease.  2D Echo: 07/28/2014 - Moderate LVH with LVEF 55-60%, grade 1 diastolic dysfunction. Severe posterior MAC with trivial mitral regurgitation. Mild aortic regurgitation. No PFO or ASD.  Unable to assess PASP.  EEG - pending  Carotid Doppler 07/28/2014 Preliminary report - Technically difficult due to movement and patient condition. right: 40-59% internal carotid artery stenosis. Vertebral artery flow is antegrade. Left - 1-39% ICA stenosis. Vertebral artery flow is antegrade.    PHYSICAL EXAM  PHYSICAL EXAM Physical exam: Exam: Gen: NAD Eyes: anicteric sclerae, moist conjunctivae                    CV: RRR, no MRG Mental Status: More alert today, sitting on the side of the bed, eyes open but not following commands or  interacting  Neuro: Detailed Neurologic Exam  Speech:    Non-verbal  Cranial Nerves:    The pupils are equal, round, and reactive to light. Conjugate gaze. Could not visualize fundi. Face symmetric.  Motor Observation:    no involuntary movements noted.    Strength:    Moves all limbs symmetrically, appears anti gravity but difficult to assess as not following commands   Sensory: Grimaces to pain in all extremities    DTRs: Symmetric bilat    Toes: Downgoing bilat      Gait: Unable to test, physical therapy trying to get her out of bed today       ASSESSMENT/PLAN Ms. Jill HazardVirginia [name] is a 80 y.o. female with history of dementia, hypertension, coronary artery disease, and bradycardia, presenting with difficulty speaking and left hemiparesis. She did not receive IV t-PA due to resolution of deficits.   TIA:  Non-dominant   Resultant  resolution of deficits  MRI  As above - no acute abnormality  MRA  As above - no large vessel stenosis.  Carotid Doppler - 40-59% right internal carotid artery stenosis.  2D Echo  - as above - unremarkable  EEG - pending  LDL 130  HgbA1c 5.2  Subcutaneous heparin for VTE prophylaxis  Diet regular with thin liquids  aspirin 325 mg orally every day prior to admission, now on aspirin 81 mg orally every day. Can consider switching to Plavix if EEG is negative.   Ongoing aggressive stroke risk factor management  Therapy recommendations: Home health therapy with 24-hour per day supervision. Family declined SNF.  Disposition:  Pending  Hypertension  Home meds:  No antihypertensive medications prior to admission.  Stable but low at times 92/53  Hyperlipidemia  Home meds: No lipid lowering medications prior to admission  LDL 132, goal < 70  Now on Lipitor 20 mg daily  Continue statin at discharge  Other Stroke Risk Factors  Advanced age  Hx stroke/TIA  Coronary artery disease  Other Active  Problems  Dementia  Other Pertinent History HgbA1c 5.2, not diabetic  The stroke service will sign off at this time. Please contact us if you need further assistance.   Hospital day # 2  Delton SeeDavid Rinehuls PA-C [facility] Pager [phone] 07/29/2014, 10:45 AM  Personally examined patient and images, agree with history, physical, neuro exam as stated above. Agree with assessment and plan.   Jill DeanAntonia Miabella Shannahan, MD [facility]        To contact Stroke Continuity provider, please refer to [email or web address]. After hours, contact General Neurology

## 2014-07-29 NOTE — Progress Notes (Signed)
Physical Therapy Evaluation Patient Details Name: Jill [name] MRN: [id number] DOB: Mar 10, 1929 Today's Date: 07/29/2014   History of Present Illness  Patient is an 80 yo female admitted 07/27/14 with Lt sided weakness/stiffness for w/u of TIA vs seizures.  PMH:  dementia (non-verbal), HTN, CAD, bradycardia.  Clinical Impression  Patient presents with problems listed below. Will benefit from acute PT to maximize functional mobility prior to discharge home with daughter.  Patient very lethargic today - eyes closed through most of session.  Was able to move herself back to supine at end of session.  Recommend HHPT for continued therapy and HH Aide for assist with ADL's at discharge.  Patient would also benefit from w/c for safe mobility in the house.    Follow Up Recommendations Home health PT;Supervision/Assistance - 24 hour (Daughter declining SNF at this time.  Needs HH Aide)    Equipment Recommendations  Wheelchair (measurements PT);Wheelchair cushion (measurements PT)    Recommendations for Other Services       Precautions / Restrictions Precautions Precautions: Fall Restrictions Weight Bearing Restrictions: No      Mobility  Bed Mobility Overal bed mobility: Needs Assistance Bed Mobility: Supine to Sit;Sit to Supine     Supine to sit: Total assist Sit to supine: Mod assist   General bed mobility comments: Verbal and tactile cues to initiate movement.  Patient very lethargic.  Provided total assist to move patient to sitting at EOB.  Once upright, patient opened eyes intermittently.  Required min assist to maintain sitting balance.  Was able to maintain sitting balance for approximately 10 seconds before drifting toward left side.  Patient spontaneously turning head to right and left side.  Patient at EOB x 12 minutes.  Patient then initiated moving back to supine.  Assist to control trunk down to bed.  Patient able to lift LE's onto bed.  Required increased time for this  transition.  Transfers Overall transfer level: Needs assistance Equipment used: 1 person hand held assist Transfers: Sit to/from Stand Sit to Stand: Max assist         General transfer comment: From sitting position in front of patient, PT facilitated transition from sit > stand at hips and trunk. Patient cleared hips from bed, but did not complete stance up to fully upright position.  Attempted x3.  Ambulation/Gait                Stairs            Wheelchair Mobility    Modified Rankin (Stroke Patients Only) Modified Rankin (Stroke Patients Only) Pre-Morbid Rankin Score: Moderate disability Modified Rankin: Severe disability (primarily due to dementia/lethargy)     Balance Overall balance assessment: Needs assistance Sitting-balance support: Single extremity supported;Feet supported Sitting balance-Leahy Scale: Poor Sitting balance - Comments: Required min assist to maintain balance. Postural control: Left lateral lean;Posterior lean Standing balance support: Bilateral upper extremity supported Standing balance-Leahy Scale: Zero Standing balance comment: Unable to reach standing position                             Pertinent Vitals/Pain Pain Assessment:  (PAINAD score = 0)    Home Living Family/patient expects to be discharged to:: Private residence Living Arrangements: Children (Daughter and grandchildren) Available Help at Discharge: Family;Available 24 hours/day (Daughter providing 24/7 care with assist of her children) Type of Home: House Home Access: Level entry     Home Layout: Two level;Able to live on  main level with bedroom/bathroom Home Equipment: Bedside commode      Prior Function Level of Independence: Independent;Needs assistance   Gait / Transfers Assistance Needed: Patient was independent with ambulation pta.  No falls per daughter.  Reports [place] and then come back inside throughout  the day.  ADL's / Homemaking Assistance Needed: Assist with bathing and dressing.  Total assist with meal prep and housekeeping        Hand Dominance        Extremity/Trunk Assessment   Upper Extremity Assessment: Generalized weakness;Difficult to assess due to impaired cognition (Noted minimal voluntary movement BUE's)           Lower Extremity Assessment: Generalized weakness;Difficult to assess due to impaired cognition (Increased extensor rigidity.  Able to lift LE's against grav)      Cervical / Trunk Assessment: Kyphotic  Communication   Communication: Receptive difficulties;Expressive difficulties  Cognition Arousal/Alertness: Lethargic (Question due to meds.  Dtr reports pt usually is alert in pm) Behavior During Therapy: Flat affect Overall Cognitive Status: Impaired/Different from baseline Area of Impairment: Attention;Following commands   Current Attention Level:  (Decreased arousal.  Eyes closed through most of session)   Following Commands:  (Not following one-step commands this am)            General Comments      Exercises        Assessment/Plan    PT Assessment Patient needs continued PT services  PT Diagnosis Difficulty walking;Generalized weakness;Altered mental status   PT Problem List Decreased strength;Decreased activity tolerance;Decreased balance;Decreased mobility;Decreased coordination;Decreased cognition;Impaired tone  PT Treatment Interventions DME instruction;Gait training;Functional mobility training;Therapeutic activities;Patient/family education   PT Goals (Current goals can be found in the Care Plan section) Acute Rehab PT Goals Patient Stated Goal: Unable to state.  Daughter's goal is to take patient home. PT Goal Formulation: With family Time For Goal Achievement: 08/05/14 Potential to Achieve Goals: Fair    Frequency Min 3X/week   Barriers to discharge        Co-evaluation               End of Session  Equipment Utilized During Treatment: Gait belt Activity Tolerance: Patient limited by lethargy Patient left: in bed;with call bell/phone within reach;with bed alarm set;with family/visitor present Nurse Communication: Mobility status         Time: [id number] PT Time Calculation (min) (ACUTE ONLY): 38 min   Charges:   PT Evaluation $Initial PT Evaluation Tier I: 1 Procedure PT Treatments $Therapeutic Activity: 23-37 mins   PT G CodesVena Austria:          Mikaeel Petrow H 07/29/2014, 10:31 AM [name], PT, [facility] Acute Rehab Services Pager [phone]

## 2014-07-29 NOTE — Progress Notes (Signed)
Family Medicine Teaching Service Daily Progress Note Intern Pager: [phone]  Patient name: Jill HazardVirginia Goecke Medical record number: [id number] Date of birth: 04-19-1929 Age: 80 y.o. Gender: female  Primary Care Provider: No PCP Per Patient Consultants: Neurology Code Status: DNR  Assessment and Plan: Jill [name] is a 80 y.o. female presenting with TIA versus seizure . PMH is significant for vascular dementia and hypertension which no longer requires medication.  TIA. Symptoms consistent with TIA versus partial seizure with Todd's paralysis - Neurology consulted in the emergency department, appreciate recommendations - continue aspirin, Start lipitor here - LDL 130, A1c 5.2 - CT head with old tiny left parietal stroke, no acute findings - MRI/MRA brain: Advanced atrophy, mild chronic microvascular ischemic change - TTE 55-60% EF, mild MR/AR  - Carotid Dopplers: Right 40-59% ICA stenosis, Left 1-39% ICA stenosis - F/u EEG with concern for seizure - Allow for permissive hypertension to 220/120, PRN hydralazine after that - PT recs: Home Health PT with 24 hour assistance, family declines SNF at this time  Dementia, likely vascular type.  Family states she has agitation in the middle of the day controlled with Valium and trazodone - Wean home valium to 2 tid prn, will plan for completely weaning off as outpatient - Wean trazodone to qhs prn dosing - Haldol 0.5mg  q6hrs prn agitation  Bradycardia, CAD - EKG with sinus bradycardia with short PR segment, likely artifact appearing to be repetitive PR's without QRS - Avoid AV nodal blocking agents, monitor on telemetry - History of "minor heart attack" - Continue aspirin, statin  Hypertension - Patient has history of hypertension but has not needed medication for quite some time  FEN/GI: Normal saline at 75 mL/hr, Regular diet Prophylaxis: Subcutaneous heparin  Dis[facility]: [facility] evaluation.    Subjective:  NAEON. Patient sleeping this morning. Able to wake up and eat breakfast.   Objective: Temp:  [87.7 F (30.9 C)-98.4 F (36.9 C)] 97.5 F (36.4 C) (11/29 0500) Pulse Rate:  [43-71] 43 (11/29 0500) Resp:  [14-17] 14 (11/29 0500) BP: (92-150)/(47-82) 129/65 mmHg (11/29 0500) SpO2:  [95 %-100 %] 95 % (11/29 0500) Physical Exam: Gen: NAD, sleeping in bed HEENT: NCAT, CV: Bradycardic, regular rhythm, no murmurs appreciated Resp: Clear on ant lung fields but decreased breath sounds due to limited exam due to pts' inability to participate GI: SNTND, BS present, no guarding or organomegaly Ext: No edema, warm Neuro: Sleeping, arouses to tactile stimuli, cannot follow commands, normal tone BL UE/LE  Laboratory:  Recent Labs Lab 07/27/14 1154  WBC 6.5  HGB 12.2  HCT 38.1  PLT 258    Recent Labs Lab 07/27/14 1154  NA 142  K 4.6  CL 106  CO2 22  BUN 19  CREATININE 0.82  CALCIUM 9.5  PROT 6.9  BILITOT 0.2*  ALKPHOS 102  ALT 10  AST 16  GLUCOSE 87    TCholesterol 232, LDL 130 A1C 5.2     Recent Labs Lab 07/27/14 1154  TROPONINI <0.30     Imaging/Diagnostic Tests: CT head 07/27/2014 Atrophy with small vessel chronic ischemic changes of deep cerebral white matter. Tiny old LEFT parietal cortical infarct. No acute intracranial abnormalities.  EKG 07/27/2014: with sinus bradycardia with short PR segment, likely artifact appearing to be repetitive PR's without QRS  Jacquiline Doealeb Parker, MD 07/29/2014, 8:58 AM PGY-1, [facility] FPTS Intern pager: [phone], text pages welcome

## 2014-07-30 DIAGNOSIS — F0150 Vascular dementia without behavioral disturbance: Secondary | ICD-10-CM

## 2014-07-30 LAB — GLUCOSE, CAPILLARY
GLUCOSE-CAPILLARY: 144 mg/dL — AB (ref 70–99)
Glucose-Capillary: 103 mg/dL — ABNORMAL HIGH (ref 70–99)
Glucose-Capillary: 85 mg/dL (ref 70–99)

## 2014-07-30 MED ORDER — ASPIRIN 81 MG PO TBEC: 81.0000 mg | DELAYED_RELEASE_TABLET | Freq: Every day | ORAL | Status: DC

## 2014-07-30 MED ORDER — TRAZODONE HCL 50 MG PO TABS: 100.0000 mg | ORAL_TABLET | Freq: Every evening | ORAL | Status: DC | PRN

## 2014-07-30 MED ORDER — HALOPERIDOL 0.5 MG PO TABS: 0.5000 mg | ORAL_TABLET | Freq: Four times a day (QID) | ORAL | Status: DC | PRN

## 2014-07-30 MED ORDER — ATORVASTATIN CALCIUM 20 MG PO TABS: 20.0000 mg | ORAL_TABLET | Freq: Every day | ORAL | Status: DC

## 2014-07-30 MED ORDER — DIAZEPAM 2 MG PO TABS: 2.0000 mg | ORAL_TABLET | Freq: Three times a day (TID) | ORAL | Status: DC | PRN

## 2014-07-30 NOTE — Progress Notes (Signed)
Occupational Therapy Evaluation Patient Details Name: Jill [name] MRN: [id number] DOB: 12-12-1928 Today's Date: 07/30/2014    History of Present Illness Patient is an 80 yo female admitted 07/27/14 with Lt sided weakness/stiffness for w/u of TIA vs seizures.  PMH:  dementia (non-verbal), HTN, CAD, bradycardia.   Clinical Impression   Patient able to complete 2 ADL tasks with set up and verbal/visual cues provided. Pt required 2 person hand-held assist for transfers and ambulation. Recommending HHOT and 24/7 supervision/assistance at home.      Follow Up Recommendations  Home health OT;Supervision/Assistance - 24 hour    Equipment Recommendations  Other (comment) (TBD when family is present)    Recommendations for Other Services       Precautions / Restrictions Precautions Precautions: Fall Restrictions Weight Bearing Restrictions: No      Mobility Bed Mobility Overal bed mobility: Needs Assistance Bed Mobility: Supine to Sit;Sit to Supine     Supine to sit: Total assist     General bed mobility comments: Pt in chair on OT arrival  Transfers Overall transfer level: Needs assistance Equipment used: 2 person hand held assist Transfers: Sit to/from Stand Sit to Stand: Min assist;+2 safety/equipment         General transfer comment: Min (A) for boost to standing and to maintain balance.    Balance Overall balance assessment: Needs assistance Sitting-balance support: Single extremity supported;Feet supported Sitting balance-Leahy Scale: Fair     Standing balance support: Bilateral upper extremity supported;During functional activity Standing balance-Leahy Scale: Poor                              ADL Overall ADL's : Needs assistance/impaired     Grooming: Wash/dry hands;Wash/dry face;Oral care;Brushing hair;Minimal assistance;Cueing for sequencing;Sitting Grooming Details (indicate cue type and reason): Hand-over-hand assist in attempts to  initiate oral care, but without success. Provided pt with comb and pt brushed hair independently. Provided pt with warm wash cloth and pt completed face washing with verbal and visual cues.                 Toilet Transfer: Minimal assistance;+2 for safety/equipment;Cueing for sequencing;Ambulation;BSC Toilet Transfer Details (indicate cue type and reason): Min (A) to place hands [facility] and provide support for safe descend         Functional mobility during ADLs: Minimal assistance;+2 for safety/equipment;Cueing for sequencing General ADL Comments: Presented 3 (toothbrush, comb, wash cloth) ADL items and provided hand-over-hand assist to intiate task. Pt able to complete hair brushing and face washing with verbal and visual cues. Pt required hand-held assist for ambulation into and out of bathroom. Pt with minimal verbal output during session     Vision                     Perception     Praxis      Pertinent Vitals/Pain Pain Assessment: Faces Faces Pain Scale: No hurt     Hand Dominance     Extremity/Trunk Assessment Upper Extremity Assessment Upper Extremity Assessment: Generalized weakness;Difficult to assess due to impaired cognition   Lower Extremity Assessment Lower Extremity Assessment: Generalized weakness;Difficult to assess due to impaired cognition   Cervical / Trunk Assessment Cervical / Trunk Assessment: Kyphotic   Communication Communication Communication: Receptive difficulties;Expressive difficulties   Cognition Arousal/Alertness: Awake/alert Behavior During Therapy: Flat affect Overall Cognitive Status: Impaired/Different from baseline Area of Impairment: Attention;Following commands   Current Attention Level: Focused  Following Commands: Follows one step commands inconsistently;Follows one step commands with increased time       General Comments: Patient with dementia at baseline   General Comments       Exercises        Shoulder Instructions      Home Living                                   Additional Comments: Pt with dementia and is non-verbal. No family at bedside to provide more home information.      Prior Functioning/Environment               OT Diagnosis: Generalized weakness;Cognitive deficits   OT Problem List: Decreased strength;Decreased range of motion;Decreased activity tolerance;Impaired balance (sitting and/or standing);Decreased coordination;Decreased cognition;Decreased safety awareness;Decreased knowledge of use of DME or AE   OT Treatment/Interventions: Self-care/ADL training;Therapeutic exercise;DME and/or AE instruction;Therapeutic activities;Patient/family education;Balance training    OT Goals(Current goals can be found in the care plan section) Acute Rehab OT Goals Patient Stated Goal: none stated OT Goal Formulation: Patient unable to participate in goal setting Time For Goal Achievement: 08/13/14 Potential to Achieve Goals: Good ADL Goals Pt Will Perform Grooming: with set-up;sitting;with caregiver independent in assisting Pt Will Perform Upper Body Dressing: with set-up;sitting;with caregiver independent in assisting Pt Will Perform Lower Body Dressing: with caregiver independent in assisting;with supervision;sit to/from stand Pt Will Transfer to Toilet: ambulating;bedside commode;with min guard assist Pt Will Perform Toileting - Clothing Manipulation and hygiene: with min guard assist;with caregiver independent in assisting;sit to/from stand  OT Frequency: Min 2X/week   Barriers to D/C:            Co-evaluation              End of Session Equipment Utilized During Treatment: Gait belt Nurse Communication: Mobility status;Precautions  Activity Tolerance: Patient tolerated treatment well Patient left: in chair;with call bell/phone within reach;with chair alarm set   Time: [id number] OT Time Calculation (min): 20 min Charges:     G-Codes:    Nils PyleBermel, Elizeo Rodriques 07/30/2014, 1:23 PM

## 2014-07-30 NOTE — Progress Notes (Signed)
Family Medicine Teaching Service Daily Progress Note Intern Pager: [phone]  Patient name: Jill HazardVirginia Lacina Medical record number: [id number] Date of birth: [birth year] Age: 80 y.o. Gender: female  Primary Care Provider: No PCP Per Patient Consultants: Neurology Code Status: DNR  Assessment and Plan: Jill [name] is a 80 y.o. female presenting with TIA versus seizure . PMH is significant for vascular dementia and hypertension which no longer requires medication.  TIA. Symptoms consistent with TIA versus partial seizure with Todd's paralysis - Neurology consulted in the emergency department, appreciate recommendations - continue aspirin, Start lipitor here - LDL 130, A1c 5.2 - CT head with old tiny left parietal stroke, no acute findings - MRI/MRA brain: Advanced atrophy, mild chronic microvascular ischemic change - TTE 55-60% EF, mild MR/AR  - Carotid Dopplers: Right 40-59% ICA stenosis, Left 1-39% ICA stenosis - F/u EEG with concern for seizure - Allow for permissive hypertension to 220/120, PRN hydralazine after that - PT recs: Home Health PT with 24 hour assistance, family declines SNF at this time  Dementia, likely vascular type.  Family states she has agitation in the middle of the day controlled with Valium and trazodone - Wean home valium to 2 tid prn, will plan for completely weaning off as outpatient - Wean trazodone to qhs prn dosing - Haldol 0.5mg  q6hrs prn agitation  Bradycardia, CAD - EKG with sinus bradycardia with short PR segment, likely artifact appearing to be repetitive PR's without QRS - Avoid AV nodal blocking agents, monitor on telemetry - History of "minor heart attack" - Continue aspirin, statin  Hypertension - Patient has history of hypertension but has not needed medication for quite some time  FEN/GI: Normal saline at 75 mL/hr, Regular diet Prophylaxis: Subcutaneous heparin  Disposition: [facility]   Subjective:  NAEON.  Patient sleeping this morning.   Objective: Temp:  [97.5 F (36.4 C)-98 F (36.7 C)] 97.5 F (36.4 C) (11/30 0545) Pulse Rate:  [43-88] 43 (11/30 0545) Resp:  [16-18] 18 (11/30 0545) BP: (108-135)/(41-79) 113/55 mmHg (11/30 0545) SpO2:  [93 %-100 %] 98 % (11/30 0545) Weight:  [95 lb 7.4 oz (43.3 kg)] 95 lb 7.4 oz (43.3 kg) (11/30 0545) Physical Exam: Gen: NAD, sleeping in bed HEENT: NCAT, CV: Bradycardic, regular rhythm, no murmurs appreciated Resp: Clear on ant lung fields but decreased breath sounds due to limited exam due to pts' inability to participate GI: SNTND, BS present, no guarding or organomegaly Ext: No edema, warm Neuro: Sleeping, arouses to tactile stimuli, cannot follow commands, normal tone BL UE/LE  Laboratory:  Recent Labs Lab 07/27/14 1154  WBC 6.5  HGB 12.2  HCT 38.1  PLT 258    Recent Labs Lab 07/27/14 1154  NA 142  K 4.6  CL 106  CO2 22  BUN 19  CREATININE 0.82  CALCIUM 9.5  PROT 6.9  BILITOT 0.2*  ALKPHOS 102  ALT 10  AST 16  GLUCOSE 87    TCholesterol 232, LDL 130 A1C 5.2     Recent Labs Lab 07/27/14 1154  TROPONINI <0.30     Imaging/Diagnostic Tests: CT head 07/27/2014 Atrophy with small vessel chronic ischemic changes of deep cerebral white matter. Tiny old LEFT parietal cortical infarct. No acute intracranial abnormalities.  EKG 07/27/2014: with sinus bradycardia with short PR segment, likely artifact appearing to be repetitive PR's without QRS  Jacquiline Doealeb Terrill Wauters, MD 07/30/2014, 8:25 AM PGY-1, [facility] FPTS Intern pager: [phone], text pages welcome

## 2014-07-30 NOTE — Progress Notes (Signed)
Physical Therapy Treatment Patient Details Name: Jill [name] MRN: [id number] DOB: 1928/10/21 Today's Date: 07/30/2014    History of Present Illness Patient is an 80 yo female admitted 07/27/14 with Lt sided weakness/stiffness for w/u of TIA vs seizures.  PMH:  dementia (non-verbal), HTN, CAD, bradycardia.    PT Comments    Patient able to walk small amount this session with tactile cueing. Continue to recommend WC for home use. Daughter is gone getting ramp set up at the house as patient is planning to DC later today. Continue to recommend HHPT and 24/7 assist at home. Continue with current POC  Follow Up Recommendations  Home health PT;Supervision/Assistance - 24 hour (daughter declining SNF)     Equipment Recommendations  Wheelchair (measurements PT);Wheelchair cushion (measurements PT)    Recommendations for Other Services       Precautions / Restrictions Precautions Precautions: Fall Restrictions Weight Bearing Restrictions: No    Mobility  Bed Mobility Overal bed mobility: Needs Assistance Bed Mobility: Supine to Sit;Sit to Supine     Supine to sit: Total assist     General bed mobility comments: Total A for all aspects of bed mobility. Patient cued to sit EOB but did not physically respond to that cueing  Transfers Overall transfer level: Needs assistance Equipment used: 1 person hand held assist   Sit to Stand: Mod assist         General transfer comment: Mod A to power up into standing and to ensure balance. Patient did assist fairly well  Ambulation/Gait Ambulation/Gait assistance: +2 safety/equipment;Mod assist Ambulation Distance (Feet): 40 Feet Assistive device: 1 person hand held assist Gait Pattern/deviations: Step-through pattern;Decreased stride length;Trunk flexed;Shuffle Gait velocity: decreased   General Gait Details: +2 for safety but patient able to complete with Mod A for HHA and support. Able to walk out to hall and back to  recliner. No LOB noted   Stairs            Wheelchair Mobility    Modified Rankin (Stroke Patients Only) Modified Rankin (Stroke Patients Only) Pre-Morbid Rankin Score: Moderate disability Modified Rankin: Moderately severe disability     Balance     Sitting balance-Leahy Scale: Fair       Standing balance-Leahy Scale: Poor                      Cognition Arousal/Alertness: Awake/alert Behavior During Therapy: Flat affect Overall Cognitive Status: Impaired/Different from baseline Area of Impairment: Attention;Following commands               General Comments: Patient with dementia at baseline    Exercises      General Comments        Pertinent Vitals/Pain Pain Assessment: No/denies pain    Home Living                      Prior Function            PT Goals (current goals can now be found in the care plan section) Progress towards PT goals: Progressing toward goals    Frequency  Min 3X/week    PT Plan Current plan remains appropriate    Co-evaluation             End of Session Equipment Utilized During Treatment: Gait belt Activity Tolerance: Patient tolerated treatment well Patient left: in chair;with call bell/phone within reach;with chair alarm set     Time: 1050-1105 PT Time Calculation (min) (ACUTE ONLY):  15 min  Charges:  $Gait Training: 8-22 mins                    G Codes:      Fredrich BirksRobinette, Jamaar Howes Elizabeth 07/30/2014, 12:15 PM  07/30/2014 Fredrich Birksobinette, Darren Caldron Elizabeth PTA [phone] pager [phone] office

## 2014-07-30 NOTE — Progress Notes (Signed)
Patient discharged home with daughter at bedside. Wheelchair and discharged instructions given to daughter. Daughter stated understanding of instructions given with the understanding of follow up appointment with patient's PCP, and medications to be picked up at the pharmacy. Marin RobertsAisha Patti Shorb RN

## 2014-07-30 NOTE — Progress Notes (Signed)
CARE MANAGEMENT NOTE 07/30/2014  Patient:  Jill [name],Jill [name]   Account Number:  [id number]  Date Initiated:  07/30/2014  Documentation initiated by:  [facility],Delora Gravatt  Subjective/Objective Assessment:   dementia, TIA vs seizure     Action/Plan:   Anticipated DC Date:  07/30/2014   Anticipated DC Plan:  HOME W HOME HEALTH SERVICES      DC Planning Services  CM consult      [facility]   Choice offered to / List presented to:  C-4 Adult Children   DME arranged  WHEELCHAIR - MANUAL      DME agency  [facility]     HH arranged  HH-2 PT  HH-4 NURSE'S AIDE      Status of service:  Completed, signed off Medicare Important Message given?  YES (If response is "NO", the following Medicare IM given date fields will be blank) Date Medicare IM given:  07/30/2014 Medicare IM given by:  [facility],Sherise Geerdes Date Additional Medicare IM given:   Additional Medicare IM given by:    Discharge Disposition:  HOME W HOME HEALTH SERVICES  Per UR Regulation:    If discussed at Long Length of Stay Meetings, dates discussed:    Comments:  07/30/2014 1440 [facility] T[facility] or Wed. Isidoro DonningAlesia Willies Laviolette RN CCM Case Mgmt phone [phone]  07/30/2014 1400 NCM spoke to dtr, Jerolyn Centerarolyn Bale. [facility]. Also requesting wheelchair for home. [facility]. Will give NCM a call back if they can accept referral. Contacted [facility] for wheelchair. Isidoro DonningAlesia Sierra Spargo RN CCM Case Mgmt phone [phone]

## 2014-07-30 NOTE — Discharge Summary (Signed)
Family [facility]  Patient name: Jill [name] Medical record number: [id number] Date of birth: December 29, 1928 Age: 80 y.o. Gender: female Date of Admission: 07/27/2014  Date of Discharge:07/30/2014  Admitting Physician: No admitting provider for patient encounter.  Primary Care Provider: No PCP Per Patient Consultants: Neurology  Indication for Hospitalization: TIA work up  Discharge Diagnoses/Problem List:  TIA, Dementia  Disposition: Home with home health services  Discharge Condition: Stable  Discharge Exam: See progress note for day of discharge  Brief Hospital Course:  Jill [name] is a 80 y.o. female presenting with TIA versus seizure . PMH is significant for vascular dementia and hypertension which no longer requires medication.  Her hospital course by problem is listed below:  TIA. Work up including, MRI/MRA, CR, TTE, EEG, and carotid dopplers general unremarkable. Patient was started on lipitor here. PT recommended SNF, however family declined. She will be discharged with Home Health PT with 24 hour assistance.  Dementia, likely vascular type. Weaned patient to valium 2mg  tid prn and trazodone to qhrs prn dosing.   Hypertension. History of HTN, however controlled without medications.   Issues for Follow Up:  1) f/u dementia/agitation. Consider weaning of vallium completely - weaned to 2mg  tid here.   Significant Procedures: None  Significant Labs and Imaging:   Recent Labs Lab 07/27/14 1154  WBC 6.5  HGB 12.2  HCT 38.1  PLT 258    Recent Labs Lab 07/27/14 1154  NA 142  K 4.6  CL 106  CO2 22  GLUCOSE 87  BUN 19  CREATININE 0.82  CALCIUM 9.5  ALKPHOS 102  AST 16  ALT 10  ALBUMIN 3.4*   TCholesterol 232, LDL 130 A1C 5.2  Results/Tests Pending at Time of Discharge: None  Discharge Medications:    Medication List    STOP taking these medications        aspirin 325 MG tablet  Replaced by:   aspirin 81 MG EC tablet      TAKE these medications        aspirin 81 MG EC tablet  Take 1 tablet (81 mg total) by mouth daily.     atorvastatin 20 MG tablet  Commonly known as:  LIPITOR  Take 1 tablet (20 mg total) by mouth daily at 6 PM.     diazepam 2 MG tablet  Commonly known as:  VALIUM  Take 1 tablet (2 mg total) by mouth every 8 (eight) hours as needed for anxiety.     traZODone 50 MG tablet  Commonly known as:  DESYREL  Take 2 tablets (100 mg total) by mouth at bedtime as needed for sleep.        Discharge Instructions: Please refer to Patient Instructions section of EMR for full details.  Patient was counseled important signs and symptoms that should prompt return to medical care, changes in medications, dietary instructions, activity restrictions, and follow up appointments.   Follow-Up Appointments: Follow-up Information    [facility]   Why:  Home Health Physical Therapy and aide   Contact information:   [address]  [place], [place] [address]  Phone: [phone]      Jacquiline Doealeb Parker, MD 07/30/2014, 2:50 PM PGY-1, [facility]

## 2014-07-30 NOTE — Progress Notes (Signed)
Utilization review completed. Antonius Hartlage, RN, BSN. 

## 2014-07-30 NOTE — Procedures (Signed)
ELECTROENCEPHALOGRAM REPORT  Patient: [place]       Room #: 870 [id number] [id number] EEG No. ID: [id number] Age: 80 y.o.        Sex: female Referring Physician: MCDIARMID, T Report Date:  07/30/2014        Interpreting Physician: Aline BrochureSTEWART,Daeshon Grammatico R  History: Wynonia HazardVirginia Holtman is an 80 y.o. female who was admitted following an episode of speech difficulty and possible tonic seizure. She has a history of underlying dementia.  Indications for study:  Rule out seizure activity.  Technique: This is an 18 channel routine scalp EEG performed at the bedside with bipolar and monopolar montages arranged in accordance to the international 10/20 system of electrode placement.   Description: This EEG recording was performed during wakefulness. The predominant background activity consisted of diffuse moderate amplitude mixed irregular delta and theta activity continuously. Photic stimulation and hyperventilation were not performed. No epileptiform discharges were recorded.  Interpretation: This EEG is abnormal with moderately severe generalized continuous nonspecific slowing of cerebral activity. No evidence of an epileptic disorder was demonstrated.   Venetia MaxonR Chenee Munns M.D. [facility] [phone]

## 2014-07-30 NOTE — Plan of Care (Signed)
Problem: Consults Goal: Nutrition Consult-if indicated Outcome: Not Applicable Date Met:  29/29/09

## 2015-02-25 ENCOUNTER — Inpatient Hospital Stay (HOSPITAL_COMMUNITY)
Admission: EM | Admit: 2015-02-25 | Discharge: 2015-02-28 | DRG: 469 | Disposition: A | Discharge status: Skilled Nursing Facility | Payer: Medicare Other | Attending: Internal Medicine | Admitting: Internal Medicine

## 2015-02-25 ENCOUNTER — Emergency Department (HOSPITAL_COMMUNITY): Payer: Medicare Other

## 2015-02-25 ENCOUNTER — Encounter (HOSPITAL_COMMUNITY): Payer: Self-pay | Admitting: Emergency Medicine

## 2015-02-25 ENCOUNTER — Inpatient Hospital Stay (HOSPITAL_COMMUNITY): Payer: Medicare Other

## 2015-02-25 ENCOUNTER — Inpatient Hospital Stay (HOSPITAL_COMMUNITY): Payer: Medicare Other | Admitting: Certified Registered"

## 2015-02-25 ENCOUNTER — Encounter (HOSPITAL_COMMUNITY)
Admission: EM | Disposition: A | Discharge status: Skilled Nursing Facility | Payer: Self-pay | Source: Home / Self Care | Attending: Internal Medicine

## 2015-02-25 DIAGNOSIS — E785 Hyperlipidemia, unspecified: Secondary | ICD-10-CM | POA: Diagnosis present

## 2015-02-25 DIAGNOSIS — W182XXA Fall in (into) shower or empty bathtub, initial encounter: Secondary | ICD-10-CM | POA: Diagnosis present

## 2015-02-25 DIAGNOSIS — N390 Urinary tract infection, site not specified: Secondary | ICD-10-CM | POA: Diagnosis not present

## 2015-02-25 DIAGNOSIS — Z79899 Other long term (current) drug therapy: Secondary | ICD-10-CM

## 2015-02-25 DIAGNOSIS — I5189 Other ill-defined heart diseases: Secondary | ICD-10-CM | POA: Diagnosis present

## 2015-02-25 DIAGNOSIS — Y93E1 Activity, personal bathing and showering: Secondary | ICD-10-CM

## 2015-02-25 DIAGNOSIS — Z7982 Long term (current) use of aspirin: Secondary | ICD-10-CM

## 2015-02-25 DIAGNOSIS — Z09 Encounter for follow-up examination after completed treatment for conditions other than malignant neoplasm: Secondary | ICD-10-CM | POA: Diagnosis not present

## 2015-02-25 DIAGNOSIS — M25551 Pain in right hip: Secondary | ICD-10-CM | POA: Diagnosis present

## 2015-02-25 DIAGNOSIS — S72001A Fracture of unspecified part of neck of right femur, initial encounter for closed fracture: Principal | ICD-10-CM | POA: Diagnosis present

## 2015-02-25 DIAGNOSIS — E43 Unspecified severe protein-calorie malnutrition: Secondary | ICD-10-CM | POA: Diagnosis present

## 2015-02-25 DIAGNOSIS — F0150 Vascular dementia without behavioral disturbance: Secondary | ICD-10-CM | POA: Diagnosis not present

## 2015-02-25 DIAGNOSIS — Z681 Body mass index (BMI) 19 or less, adult: Secondary | ICD-10-CM

## 2015-02-25 DIAGNOSIS — W19XXXA Unspecified fall, initial encounter: Secondary | ICD-10-CM | POA: Diagnosis not present

## 2015-02-25 DIAGNOSIS — W19XXXS Unspecified fall, sequela: Secondary | ICD-10-CM | POA: Diagnosis not present

## 2015-02-25 HISTORY — PX: HIP ARTHROPLASTY: SHX981

## 2015-02-25 LAB — CBC WITH DIFFERENTIAL/PLATELET
Basophils Absolute: 0 10*3/uL (ref 0.0–0.1)
Basophils Relative: 0 % (ref 0–1)
Eosinophils Absolute: 0.1 10*3/uL (ref 0.0–0.7)
Eosinophils Relative: 1 % (ref 0–5)
HCT: 39 % (ref 36.0–46.0)
Hemoglobin: 12.6 g/dL (ref 12.0–15.0)
Lymphocytes Relative: 5 % — ABNORMAL LOW (ref 12–46)
Lymphs Abs: 0.6 10*3/uL — ABNORMAL LOW (ref 0.7–4.0)
MCH: 29.4 pg (ref 26.0–34.0)
MCHC: 32.3 g/dL (ref 30.0–36.0)
MCV: 91.1 fL (ref 78.0–100.0)
Monocytes Absolute: 0.5 10*3/uL (ref 0.1–1.0)
Monocytes Relative: 4 % (ref 3–12)
Neutro Abs: 11 10*3/uL — ABNORMAL HIGH (ref 1.7–7.7)
Neutrophils Relative %: 90 % — ABNORMAL HIGH (ref 43–77)
Platelets: 301 10*3/uL (ref 150–400)
RBC: 4.28 MIL/uL (ref 3.87–5.11)
RDW: 12.7 % (ref 11.5–15.5)
WBC: 12.2 10*3/uL — ABNORMAL HIGH (ref 4.0–10.5)

## 2015-02-25 LAB — I-STAT CHEM 8, ED
BUN: 17 mg/dL (ref 6–20)
Calcium, Ion: 1.25 mmol/L (ref 1.13–1.30)
Chloride: 100 mmol/L — ABNORMAL LOW (ref 101–111)
Creatinine, Ser: 0.9 mg/dL (ref 0.44–1.00)
Glucose, Bld: 114 mg/dL — ABNORMAL HIGH (ref 65–99)
HCT: 40 % (ref 36.0–46.0)
Hemoglobin: 13.6 g/dL (ref 12.0–15.0)
Potassium: 4.2 mmol/L (ref 3.5–5.1)
Sodium: 140 mmol/L (ref 135–145)
TCO2: 28 mmol/L (ref 0–100)

## 2015-02-25 LAB — BASIC METABOLIC PANEL
Anion gap: 8 (ref 5–15)
BUN: 14 mg/dL (ref 6–20)
CO2: 30 mmol/L (ref 22–32)
Calcium: 9.2 mg/dL (ref 8.9–10.3)
Chloride: 100 mmol/L — ABNORMAL LOW (ref 101–111)
Creatinine, Ser: 0.81 mg/dL (ref 0.44–1.00)
GFR calc Af Amer: >60 mL/min (ref >60)
GFR calc non Af Amer: >60 mL/min (ref >60)
Glucose, Bld: 117 mg/dL — ABNORMAL HIGH (ref 65–99)
Potassium: 4.1 mmol/L (ref 3.5–5.1)
Sodium: 138 mmol/L (ref 135–145)

## 2015-02-25 LAB — SURGICAL PCR SCREEN
MRSA, PCR: NEGATIVE
Staphylococcus aureus: NEGATIVE

## 2015-02-25 SURGERY — HEMIARTHROPLASTY, HIP, DIRECT ANTERIOR APPROACH, FOR FRACTURE
Anesthesia: General | Site: Hip | Laterality: Right

## 2015-02-25 MED ORDER — LIDOCAINE HCL (CARDIAC) 20 MG/ML IV SOLN
INTRAVENOUS | Status: DC | PRN
  Administered 2015-02-25: 50 mg via INTRAVENOUS

## 2015-02-25 MED ORDER — PROPOFOL 10 MG/ML IV BOLUS
INTRAVENOUS | Status: DC | PRN
  Administered 2015-02-25: 50 mg via INTRAVENOUS

## 2015-02-25 MED ORDER — BUPIVACAINE LIPOSOME 1.3 % IJ SUSP
20.0000 mL | INTRAMUSCULAR | Status: AC
  Filled 2015-02-25: qty 20

## 2015-02-25 MED ORDER — ACETAMINOPHEN 325 MG PO TABS
650.0000 mg | ORAL_TABLET | Freq: Four times a day (QID) | ORAL | Status: DC | PRN
  Administered 2015-02-26 – 2015-02-27 (×3): 650 mg via ORAL
  Filled 2015-02-25 (×4): qty 2

## 2015-02-25 MED ORDER — ASPIRIN EC 325 MG PO TBEC
325.0000 mg | DELAYED_RELEASE_TABLET | Freq: Every day | ORAL | Status: DC
  Administered 2015-02-28: 325 mg via ORAL
  Filled 2015-02-25 (×2): qty 1

## 2015-02-25 MED ORDER — HYDROMORPHONE HCL 1 MG/ML IJ SOLN: 0.2500 mg | INTRAMUSCULAR | Status: DC | PRN

## 2015-02-25 MED ORDER — PHENYLEPHRINE HCL 10 MG/ML IJ SOLN
INTRAMUSCULAR | Status: DC | PRN
  Administered 2015-02-25: 40 ug via INTRAVENOUS

## 2015-02-25 MED ORDER — BUPIVACAINE LIPOSOME 1.3 % IJ SUSP
INTRAMUSCULAR | Status: DC | PRN
  Administered 2015-02-25: 15 mL

## 2015-02-25 MED ORDER — TRAZODONE HCL 100 MG PO TABS
100.0000 mg | ORAL_TABLET | Freq: Every evening | ORAL | Status: DC | PRN
  Administered 2015-02-25 – 2015-02-27 (×2): 100 mg via ORAL
  Filled 2015-02-25 (×3): qty 1

## 2015-02-25 MED ORDER — BUPIVACAINE HCL (PF) 0.25 % IJ SOLN
INTRAMUSCULAR | Status: AC
  Filled 2015-02-25: qty 30

## 2015-02-25 MED ORDER — FENTANYL CITRATE (PF) 100 MCG/2ML IJ SOLN
INTRAMUSCULAR | Status: AC
  Filled 2015-02-25: qty 2

## 2015-02-25 MED ORDER — FENTANYL CITRATE (PF) 250 MCG/5ML IJ SOLN
INTRAMUSCULAR | Status: AC
  Filled 2015-02-25: qty 5

## 2015-02-25 MED ORDER — 0.9 % SODIUM CHLORIDE (POUR BTL) OPTIME
TOPICAL | Status: DC | PRN
  Administered 2015-02-25: 1000 mL

## 2015-02-25 MED ORDER — PROPOFOL 10 MG/ML IV BOLUS
INTRAVENOUS | Status: AC
  Filled 2015-02-25: qty 20

## 2015-02-25 MED ORDER — MIDAZOLAM HCL 2 MG/2ML IJ SOLN
0.5000 mg | INTRAMUSCULAR | Status: AC | PRN
  Administered 2015-02-25 (×2): 0.5 mg via INTRAVENOUS

## 2015-02-25 MED ORDER — ACETAMINOPHEN 650 MG RE SUPP: 650.0000 mg | Freq: Four times a day (QID) | RECTAL | Status: DC | PRN

## 2015-02-25 MED ORDER — FLEET ENEMA 7-19 GM/118ML RE ENEM: 1.0000 | ENEMA | Freq: Once | RECTAL | Status: AC | PRN

## 2015-02-25 MED ORDER — MIDAZOLAM HCL 2 MG/2ML IJ SOLN
INTRAMUSCULAR | Status: AC
  Filled 2015-02-25: qty 2

## 2015-02-25 MED ORDER — POLYETHYLENE GLYCOL 3350 17 G PO PACK: 17.0000 g | PACK | Freq: Every day | ORAL | Status: DC | PRN

## 2015-02-25 MED ORDER — BUPIVACAINE HCL (PF) 0.25 % IJ SOLN
INTRAMUSCULAR | Status: DC | PRN
  Administered 2015-02-25: 15 mL

## 2015-02-25 MED ORDER — NEOSTIGMINE METHYLSULFATE 10 MG/10ML IV SOLN
INTRAVENOUS | Status: AC
  Filled 2015-02-25: qty 1

## 2015-02-25 MED ORDER — LACTATED RINGERS IV SOLN
INTRAVENOUS | Status: DC | PRN
  Administered 2015-02-25: 20:00:00 via INTRAVENOUS

## 2015-02-25 MED ORDER — ROCURONIUM BROMIDE 50 MG/5ML IV SOLN
INTRAVENOUS | Status: AC
  Filled 2015-02-25: qty 1

## 2015-02-25 MED ORDER — ATORVASTATIN CALCIUM 20 MG PO TABS
20.0000 mg | ORAL_TABLET | Freq: Every day | ORAL | Status: DC
  Administered 2015-02-26 – 2015-02-27 (×2): 20 mg via ORAL
  Filled 2015-02-25: qty 2
  Filled 2015-02-25 (×2): qty 1
  Filled 2015-02-25: qty 2
  Filled 2015-02-25: qty 1

## 2015-02-25 MED ORDER — FENTANYL CITRATE (PF) 100 MCG/2ML IJ SOLN
INTRAMUSCULAR | Status: DC | PRN
  Administered 2015-02-25: 50 ug via INTRAVENOUS
  Administered 2015-02-25: 100 ug via INTRAVENOUS

## 2015-02-25 MED ORDER — DIAZEPAM 2 MG PO TABS
2.0000 mg | ORAL_TABLET | Freq: Three times a day (TID) | ORAL | Status: DC | PRN
  Administered 2015-02-26 (×2): 2 mg via ORAL
  Filled 2015-02-25 (×2): qty 1

## 2015-02-25 MED ORDER — MENTHOL 3 MG MT LOZG: 1.0000 | LOZENGE | OROMUCOSAL | Status: DC | PRN

## 2015-02-25 MED ORDER — METOCLOPRAMIDE HCL 5 MG/ML IJ SOLN: 5.0000 mg | Freq: Three times a day (TID) | INTRAMUSCULAR | Status: DC | PRN

## 2015-02-25 MED ORDER — PROMETHAZINE HCL 25 MG/ML IJ SOLN: 6.2500 mg | INTRAMUSCULAR | Status: DC | PRN

## 2015-02-25 MED ORDER — SUCCINYLCHOLINE CHLORIDE 20 MG/ML IJ SOLN
INTRAMUSCULAR | Status: DC | PRN
  Administered 2015-02-25: 60 mg via INTRAVENOUS

## 2015-02-25 MED ORDER — SODIUM CHLORIDE 0.9 % IV SOLN
INTRAVENOUS | Status: AC
  Administered 2015-02-25: 16:00:00 via INTRAVENOUS

## 2015-02-25 MED ORDER — FENTANYL CITRATE (PF) 100 MCG/2ML IJ SOLN
25.0000 ug | INTRAMUSCULAR | Status: DC | PRN
  Administered 2015-02-25: 50 ug via INTRAVENOUS
  Administered 2015-02-25 (×4): 25 ug via INTRAVENOUS

## 2015-02-25 MED ORDER — DOCUSATE SODIUM 100 MG PO CAPS
100.0000 mg | ORAL_CAPSULE | Freq: Two times a day (BID) | ORAL | Status: DC
  Administered 2015-02-27: 100 mg via ORAL
  Filled 2015-02-25 (×2): qty 1

## 2015-02-25 MED ORDER — GLYCOPYRROLATE 0.2 MG/ML IJ SOLN
INTRAMUSCULAR | Status: AC
  Filled 2015-02-25: qty 1

## 2015-02-25 MED ORDER — ONDANSETRON HCL 4 MG/2ML IJ SOLN
INTRAMUSCULAR | Status: DC | PRN
  Administered 2015-02-25: 4 mg via INTRAVENOUS

## 2015-02-25 MED ORDER — ONDANSETRON HCL 4 MG/2ML IJ SOLN
INTRAMUSCULAR | Status: AC
  Filled 2015-02-25: qty 2

## 2015-02-25 MED ORDER — HYDROMORPHONE HCL 1 MG/ML IJ SOLN
0.5000 mg | INTRAMUSCULAR | Status: DC | PRN
  Administered 2015-02-25: 0.5 mg via INTRAVENOUS
  Filled 2015-02-25: qty 1

## 2015-02-25 MED ORDER — CALCIUM CARBONATE 1250 (500 CA) MG PO TABS
1.0000 | ORAL_TABLET | Freq: Two times a day (BID) | ORAL | Status: DC
  Administered 2015-02-26: 500 mg via ORAL
  Filled 2015-02-25: qty 1

## 2015-02-25 MED ORDER — SODIUM CHLORIDE 0.9 % IJ SOLN
INTRAMUSCULAR | Status: AC
  Filled 2015-02-25: qty 10

## 2015-02-25 MED ORDER — EPHEDRINE SULFATE 50 MG/ML IJ SOLN
INTRAMUSCULAR | Status: AC
  Filled 2015-02-25: qty 1

## 2015-02-25 MED ORDER — METOCLOPRAMIDE HCL 5 MG PO TABS: 5.0000 mg | ORAL_TABLET | Freq: Three times a day (TID) | ORAL | Status: DC | PRN

## 2015-02-25 MED ORDER — ONDANSETRON HCL 4 MG/2ML IJ SOLN
4.0000 mg | Freq: Three times a day (TID) | INTRAMUSCULAR | Status: DC | PRN
  Administered 2015-02-25: 4 mg via INTRAVENOUS
  Filled 2015-02-25: qty 2

## 2015-02-25 MED ORDER — GLYCOPYRROLATE 0.2 MG/ML IJ SOLN
INTRAMUSCULAR | Status: DC | PRN
  Administered 2015-02-25: 0.2 mg via INTRAVENOUS

## 2015-02-25 MED ORDER — ACETAMINOPHEN 325 MG PO TABS: 650.0000 mg | ORAL_TABLET | Freq: Four times a day (QID) | ORAL | Status: DC | PRN

## 2015-02-25 MED ORDER — EPHEDRINE SULFATE 50 MG/ML IJ SOLN
INTRAMUSCULAR | Status: DC | PRN
  Administered 2015-02-25: 5 mg via INTRAVENOUS

## 2015-02-25 MED ORDER — CEFAZOLIN SODIUM 1-5 GM-% IV SOLN
INTRAVENOUS | Status: DC | PRN
  Administered 2015-02-25: 1 g via INTRAVENOUS

## 2015-02-25 MED ORDER — CEFAZOLIN SODIUM 1-5 GM-% IV SOLN
INTRAVENOUS | Status: AC
  Filled 2015-02-25: qty 50

## 2015-02-25 MED ORDER — PHENOL 1.4 % MT LIQD: 1.0000 | OROMUCOSAL | Status: DC | PRN

## 2015-02-25 MED ORDER — SUCCINYLCHOLINE CHLORIDE 20 MG/ML IJ SOLN
INTRAMUSCULAR | Status: AC
  Filled 2015-02-25: qty 1

## 2015-02-25 MED ORDER — PHENYLEPHRINE 40 MCG/ML (10ML) SYRINGE FOR IV PUSH (FOR BLOOD PRESSURE SUPPORT)
PREFILLED_SYRINGE | INTRAVENOUS | Status: AC
  Filled 2015-02-25: qty 10

## 2015-02-25 MED ORDER — LIDOCAINE HCL (CARDIAC) 20 MG/ML IV SOLN
INTRAVENOUS | Status: AC
  Filled 2015-02-25: qty 5

## 2015-02-25 SURGICAL SUPPLY — 69 items
18 GA WIRE (Wire) ×2 IMPLANT
BENZOIN TINCTURE PRP APPL 2/3 (GAUZE/BANDAGES/DRESSINGS) IMPLANT
BLADE SAW SAG 73X25 THK (BLADE) ×2
BLADE SAW SGTL 73X25 THK (BLADE) ×1 IMPLANT
BLADE SURG ROTATE 9660 (MISCELLANEOUS) IMPLANT
BNDG COHESIVE 4X5 TAN STRL (GAUZE/BANDAGES/DRESSINGS) ×3 IMPLANT
BRUSH FEMORAL CANAL (MISCELLANEOUS) IMPLANT
CAPT HIP HEMI 1 ×3 IMPLANT
CLOSURE WOUND 1/2 X4 (GAUZE/BANDAGES/DRESSINGS) ×2
COVER BACK TABLE 24X17X13 BIG (DRAPES) IMPLANT
DRAPE IMP U-DRAPE 54X76 (DRAPES) ×3 IMPLANT
DRAPE INCISE IOBAN 66X45 STRL (DRAPES) IMPLANT
DRAPE ORTHO SPLIT 77X108 STRL (DRAPES) ×4
DRAPE SURG ORHT 6 SPLT 77X108 (DRAPES) ×2 IMPLANT
DRAPE U-SHAPE 47X51 STRL (DRAPES) ×3 IMPLANT
DRSG MEPILEX BORDER 4X8 (GAUZE/BANDAGES/DRESSINGS) ×3 IMPLANT
DRSG PAD ABDOMINAL 8X10 ST (GAUZE/BANDAGES/DRESSINGS) ×6 IMPLANT
DURAPREP 26ML APPLICATOR (WOUND CARE) ×3 IMPLANT
ELECT CAUTERY BLADE 6.4 (BLADE) ×3 IMPLANT
ELECT REM PT RETURN 9FT ADLT (ELECTROSURGICAL) ×3
ELECTRODE REM PT RTRN 9FT ADLT (ELECTROSURGICAL) ×1 IMPLANT
EVACUATOR 1/8 PVC DRAIN (DRAIN) IMPLANT
FACESHIELD WRAPAROUND (MASK) ×3 IMPLANT
GAUZE SPONGE 4X4 12PLY STRL (GAUZE/BANDAGES/DRESSINGS) ×3 IMPLANT
GAUZE XEROFORM 5X9 LF (GAUZE/BANDAGES/DRESSINGS) ×3 IMPLANT
GLOVE BIOGEL PI IND STRL 8 (GLOVE) ×2 IMPLANT
GLOVE BIOGEL PI INDICATOR 8 (GLOVE) ×4
GLOVE BIOGEL PI ORTHO PRO SZ7 (GLOVE) ×2
GLOVE ORTHO TXT STRL SZ7.5 (GLOVE) ×6 IMPLANT
GLOVE PI ORTHO PRO STRL SZ7 (GLOVE) ×1 IMPLANT
GOWN STRL REUS W/ TWL LRG LVL3 (GOWN DISPOSABLE) ×1 IMPLANT
GOWN STRL REUS W/ TWL XL LVL3 (GOWN DISPOSABLE) ×1 IMPLANT
GOWN STRL REUS W/TWL 2XL LVL3 (GOWN DISPOSABLE) ×3 IMPLANT
GOWN STRL REUS W/TWL LRG LVL3 (GOWN DISPOSABLE) ×2
GOWN STRL REUS W/TWL XL LVL3 (GOWN DISPOSABLE) ×2
HANDPIECE INTERPULSE COAX TIP (DISPOSABLE)
IMMOBILIZER KNEE 22 UNIV (SOFTGOODS) ×3 IMPLANT
KIT BASIN OR (CUSTOM PROCEDURE TRAY) ×3 IMPLANT
KIT ROOM TURNOVER OR (KITS) ×3 IMPLANT
MANIFOLD NEPTUNE II (INSTRUMENTS) ×3 IMPLANT
NDL SUT 2 .5 CRC MAYO 1.732X (NEEDLE) ×1 IMPLANT
NEEDLE 22X1 1/2 (OR ONLY) (NEEDLE) ×3 IMPLANT
NEEDLE HYPO 25GX1X1/2 BEV (NEEDLE) ×3 IMPLANT
NEEDLE MAYO TAPER (NEEDLE) ×2
NS IRRIG 1000ML POUR BTL (IV SOLUTION) ×3 IMPLANT
PACK TOTAL JOINT (CUSTOM PROCEDURE TRAY) ×3 IMPLANT
PACK UNIVERSAL I (CUSTOM PROCEDURE TRAY) ×3 IMPLANT
PAD ARMBOARD 7.5X6 YLW CONV (MISCELLANEOUS) ×6 IMPLANT
PIN STEINMAN 3/16 (PIN) ×3 IMPLANT
SET HNDPC FAN SPRY TIP SCT (DISPOSABLE) IMPLANT
SPONGE LAP 4X18 X RAY DECT (DISPOSABLE) ×3 IMPLANT
STAPLER VISISTAT 35W (STAPLE) ×3 IMPLANT
STRIP CLOSURE SKIN 1/2X4 (GAUZE/BANDAGES/DRESSINGS) ×4 IMPLANT
SUCTION FRAZIER TIP 10 FR DISP (SUCTIONS) ×3 IMPLANT
SUT ETHIBOND NAB CT1 #1 30IN (SUTURE) ×6 IMPLANT
SUT STEEL 7 (SUTURE) ×3 IMPLANT
SUT TICRON (SUTURE) ×6 IMPLANT
SUT VIC AB 1 CT1 27 (SUTURE) ×2
SUT VIC AB 1 CT1 27XBRD ANBCTR (SUTURE) ×1 IMPLANT
SUT VIC AB 2-0 CT1 27 (SUTURE) ×2
SUT VIC AB 2-0 CT1 TAPERPNT 27 (SUTURE) ×1 IMPLANT
SUT VICRYL 0 TIES 12 18 (SUTURE) ×3 IMPLANT
SYR 50ML LL SCALE MARK (SYRINGE) ×3 IMPLANT
SYR CONTROL 10ML LL (SYRINGE) ×3 IMPLANT
TOWEL OR 17X24 6PK STRL BLUE (TOWEL DISPOSABLE) ×3 IMPLANT
TOWEL OR 17X26 10 PK STRL BLUE (TOWEL DISPOSABLE) ×3 IMPLANT
TOWER CARTRIDGE SMART MIX (DISPOSABLE) IMPLANT
TRAY FOLEY CATH 16FRSI W/METER (SET/KITS/TRAYS/PACK) IMPLANT
WATER STERILE IRR 1000ML POUR (IV SOLUTION) ×12 IMPLANT

## 2015-02-25 NOTE — Brief Op Note (Signed)
02/25/2015  9:24 PM  PATIENT:  Jill [name]  81 y.o. female  PRE-OPERATIVE DIAGNOSIS:  right femoral neck fracture  POST-OPERATIVE DIAGNOSIS:  Right Femoral neck fracture  PROCEDURE:  Procedure(s): ARTHROPLASTY BIPOLAR HIP (HEMIARTHROPLASTY) (Right)  SURGEON:  Surgeon(s) and Role:    * Eldred MangesMark C Yates, MD - Primary  PHYSICIAN ASSISTANT: james m. Barry Dieneswens pa-c    ANESTHESIA:   general  EBL:  Total I/O In: 500 [I.V.:500] Out: -   BLOOD ADMINISTERED:none  DRAINS: none   LOCAL MEDICATIONS USED:  MARCAINE     SPECIMEN:  No Specimen  DISPOSITION OF SPECIMEN:  N/A  COUNTS:  YES  TOURNIQUET:  * No tourniquets in log *  PATIENT DISPOSITION:  PACU - hemodynamically stable.

## 2015-02-25 NOTE — Anesthesia Postprocedure Evaluation (Signed)
  Anesthesia Post-op Note  Patient: Jill [name]  Procedure(s) Performed: Procedure(s) (LRB): ARTHROPLASTY BIPOLAR HIP (HEMIARTHROPLASTY) (Right)  Patient Location: PACU  Anesthesia Type: General  Level of Consciousness: awake and alert   Airway and Oxygen Therapy: Patient Spontanous Breathing  Post-op Pain: mild  Post-op Assessment: Post-op Vital signs reviewed, Patient's Cardiovascular Status Stable, Respiratory Function Stable, Patent Airway and No signs of Nausea or vomiting  Last Vitals:  Filed Vitals:   02/25/15 2155  BP: 193/103  Pulse:   Temp:   Resp: 14    Post-op Vital Signs: stable   Complications: No apparent anesthesia complications

## 2015-02-25 NOTE — ED Notes (Signed)
Pt has advanced dementia was picked up from home via EMS for fall on wet hardwood floors. Pt has deformity to right thigh, hip area and moaning. Pt tearful. Unable to ambulate since fall. Distal circulation intact. 24g R FA,  morphine PTA.

## 2015-02-25 NOTE — Consult Note (Signed)
Jill [name] MRN: [id number] DOB/AGE: 1929-07-09 81 y.o.  Admit date: 02/25/2015  Admission Diagnoses:  Active Problems:   Hip fracture dementia  HPI: Patient with a history of dementia presented to the ED today after a fall in her home.   Poor historian.  Family member present. Normally ambulates at home but suffered a fall today.  Pain right hip with attempted weightbearing.  No complaints of other injuries.   Past Medical History: Past Medical History  Diagnosis Date  . Dementia     Surgical History: History reviewed. No pertinent past surgical history.  Family History: History reviewed. No pertinent family history.  Social History: History   Social History  . Marital Status: Married    Spouse Name: N/A  . Number of Children: N/A  . Years of Education: N/A   Occupational History  . Not on file.   Social History Main Topics  . Smoking status: Not on file  . Smokeless tobacco: Not on file  . Alcohol Use: Not on file  . Drug Use: Not on file  . Sexual Activity: Not on file   Other Topics Concern  . Not on file   Social History Narrative    Allergies: Review of patient's allergies indicates no known allergies.  Medications: I have reviewed the patient's current medications.  Vital Signs: Patient Vitals for the past 24 hrs:  BP Temp Temp src Pulse Resp SpO2  02/25/15 1445 156/57 mmHg - - - 12 -  02/25/15 1430 121/71 mmHg - - (!) 52 11 93 %  02/25/15 1415 147/67 mmHg - - 62 (!) 9 (!) 86 %  02/25/15 1400 157/63 mmHg - - 83 18 100 %  02/25/15 1346 170/56 mmHg 97.4 F (36.3 C) Axillary (!) 54 11 100 %  02/25/15 1345 170/56 mmHg - - (!) 47 12 95 %  02/25/15 1330 124/60 mmHg - - (!) 51 10 98 %  02/25/15 1315 161/87 mmHg - - (!) 43 12 98 %  02/25/15 1300 168/56 mmHg - - (!) 47 13 98 %  02/25/15 1245 162/56 mmHg - - (!) 43 (!) 9 98 %  02/25/15 1230 169/57 mmHg - - (!) 51 11 92 %  02/25/15 1224 153/57 mmHg - - (!) 42 15 99 %  02/25/15 1145 156/90 mmHg -  - (!) 44 12 100 %  02/25/15 1137 155/86 mmHg (!) 96.6 F (35.9 C) Temporal (!) 54 12 98 %    Radiology: Dg Chest Portable 1 View  02/25/2015   CLINICAL DATA:  Larey SeatFell this morning, hip fracture, history Alzheimer's  EXAM: PORTABLE CHEST - 1 VIEW  COMPARISON:  Portable exam 1332 hours without priors for comparison  FINDINGS: Normal heart size, mediastinal contours and pulmonary vascularity.  At tract calcification aorta.  Lungs clear.  No pleural effusion or pneumothorax.  Bones diffusely demineralized.  IMPRESSION: No acute abnormalities.   Electronically Signed   By: Ulyses SouthwardMark  Boles M.D.   On: 02/25/2015 13:47   Dg Hip Unilat  With Pelvis 2-3 Views Right  02/25/2015   CLINICAL DATA:  Fall.  Pain.  EXAM: RIGHT HIP (WITH PELVIS) 2-3 VIEWS  COMPARISON:  None.  FINDINGS: Mildly impacted acute femoral neck fracture on the right. No fracture of the bones of the pelvis. Intertrochanteric region appears intact.  IMPRESSION: Slightly impacted femoral neck fracture on the right.   Electronically Signed   By: Paulina FusiMark  Shogry M.D.   On: 02/25/2015 12:41    Labs:  Recent Labs  02/25/15 1347  02/25/15 1509  WBC 12.2*  --   RBC 4.28  --   HCT 39.0 40.0  PLT 301  --     Recent Labs  02/25/15 1502 02/25/15 1509  NA 138 140  K 4.1 4.2  CL 100* 100*  CO2 30  --   BUN 14 17  CREATININE 0.81 0.90  GLUCOSE 117* 114*  CALCIUM 9.2  --    No results for input(s): LABPT, INR in the last 72 hours.  Review of Systems: Review of Systems  Unable to perform ROS: dementia  Musculoskeletal: Positive for joint pain.    Physical Exam: Does not respond verbally to questioning. Does moan from pain.  No increase in respiratory effort.  Neurologically intact ABD soft Neurovascular intact Compartment soft Head is normocephalic and atraumatic EOM intact.  Oral mucosa pink and moist.  Cervical spine unremarkable Positive log roll right hip. Right le shortening. No edema.  Skin warm and dry.   Assessment and  Plan: Right hip fracture, dementia.  Discussed with family member that best treatment option at this point is right hip hemiarthroplasty.  Surgical procedure along with possible rehab/recovery time and risks/complications discussed.  All questions answered.   Quintara Bost M. Caelen Reierson PA-C For Dr Mark Yates [facility] [phone] 

## 2015-02-25 NOTE — Transfer of Care (Signed)
Immediate Anesthesia Transfer of Care Note  Patient: Jill [name]  Procedure(s) Performed: Procedure(s): ARTHROPLASTY BIPOLAR HIP (HEMIARTHROPLASTY) (Right)  Patient Location: PACU  Anesthesia Type:General  Level of Consciousness: awake  Airway & Oxygen Therapy: Patient Spontanous Breathing and Patient connected to nasal cannula oxygen  Post-op Assessment: Report given to RN and Post -op Vital signs reviewed and stable  Post vital signs: Reviewed and stable  Last Vitals:  Filed Vitals:   02/25/15 2145  BP: 196/104  Pulse:   Temp: 36.8 C  Resp:     Complications: No apparent anesthesia complications

## 2015-02-25 NOTE — ED Provider Notes (Signed)
CSN: [id number]     Arrival date & time 02/25/15  1125 History   First MD Initiated Contact with Patient 02/25/15 1132     Chief Complaint  Patient presents with  . Fall  . Hip Pain     Patient is a 81 y.o. female presenting with fall and hip pain.  Fall  Hip Pain  Pt has advanced dementia was picked up from home via EMS for fall on wet hardwood floors. Pt has deformity to right thigh, hip area and moaning. Pt tearful. Unable to ambulate since fall. Distal circulation intact. 24g R FA,  morphine PTA.   Past Medical History  Diagnosis Date  . Dementia    History reviewed. No pertinent past surgical history. History reviewed. No pertinent family history. History  Substance Use Topics  . Smoking status: Not on file  . Smokeless tobacco: Not on file  . Alcohol Use: Not on file   OB History    No data available     Review of Systems  Unable to perform ROS: Dementia      Allergies  Review of patient's allergies indicates no known allergies.  Home Medications   Prior to Admission medications   Medication Sig Start Date End Date Taking? Authorizing Provider  aspirin EC 81 MG EC tablet Take 1 tablet (81 mg total) by mouth daily. 07/30/14   Ardith Dark, MD  atorvastatin (LIPITOR) 20 MG tablet Take 1 tablet (20 mg total) by mouth daily at 6 PM. 07/30/14   Ardith Dark, MD  diazepam (VALIUM) 2 MG tablet Take 1 tablet (2 mg total) by mouth every 8 (eight) hours as needed for anxiety. 07/30/14   Ardith Dark, MD  traZODone (DESYREL) 50 MG tablet Take 2 tablets (100 mg total) by mouth at bedtime as needed for sleep. 07/30/14   Ardith Dark, MD   BP 156/57 mmHg  Pulse 52  Temp(Src) 97.4 F (36.3 C) (Axillary)  Resp 12  SpO2 93% Physical Exam  Constitutional: She appears well-developed and well-nourished. No distress.  HENT:  Head: Normocephalic and atraumatic.  Eyes: Pupils are equal, round, and reactive to light.  Neck: Normal range of motion.   Cardiovascular: Normal rate and intact distal pulses.   Pulmonary/Chest: No respiratory distress.  Abdominal: Normal appearance. She exhibits no distension. There is no tenderness. There is no rebound.  Musculoskeletal:       Right hip: She exhibits decreased range of motion, tenderness and bony tenderness. She exhibits no swelling and no crepitus.  Neurological: She is alert. No cranial nerve deficit.  Skin: Skin is warm and dry. No rash noted.  Psychiatric: She has a normal mood and affect. Her behavior is normal.  Nursing note and vitals reviewed.   ED Course  Procedures (including critical care time) Labs Review Labs Reviewed  CBC WITH DIFFERENTIAL/PLATELET - Abnormal; Notable for the following:    WBC 12.2 (*)    Neutrophils Relative % 90 (*)    Neutro Abs 11.0 (*)    Lymphocytes Relative 5 (*)    Lymphs Abs 0.6 (*)    All other components within normal limits  BASIC METABOLIC PANEL - Abnormal; Notable for the following:    Chloride 100 (*)    Glucose, Bld 117 (*)    All other components within normal limits  I-STAT CHEM 8, ED - Abnormal; Notable for the following:    Chloride 100 (*)    Glucose, Bld 114 (*)    All  other components within normal limits    Imaging Review Dg Chest Portable 1 View  02/25/2015   CLINICAL DATA:  Larey SeatFell this morning, hip fracture, history Alzheimer's  EXAM: PORTABLE CHEST - 1 VIEW  COMPARISON:  Portable exam 1332 hours without priors for comparison  FINDINGS: Normal heart size, mediastinal contours and pulmonary vascularity.  At tract calcification aorta.  Lungs clear.  No pleural effusion or pneumothorax.  Bones diffusely demineralized.  IMPRESSION: No acute abnormalities.   Electronically Signed   By: Ulyses SouthwardMark  Boles M.D.   On: 02/25/2015 13:47   Dg Hip Unilat  With Pelvis 2-3 Views Right  02/25/2015   CLINICAL DATA:  Fall.  Pain.  EXAM: RIGHT HIP (WITH PELVIS) 2-3 VIEWS  COMPARISON:  None.  FINDINGS: Mildly impacted acute femoral neck fracture on  the right. No fracture of the bones of the pelvis. Intertrochanteric region appears intact.  IMPRESSION: Slightly impacted femoral neck fracture on the right.   Electronically Signed   By: Paulina FusiMark  Shogry M.D.   On: 02/25/2015 12:41     EKG Interpretation   Date/Time:  Monday February 25 2015 13:47:05 EDT Ventricular Rate:  51 PR Interval:  67 QRS Duration: 85 QT Interval:  505 QTC Calculation: 465 R Axis:   107 Text Interpretation:  Sinus rhythm Short PR interval Probable lateral  infarct, age indeterminate Confirmed by Phi Avans  MD, Rashawn Rayman [phone] on  02/25/2015 3:10:40 PM      MDM   Final diagnoses:  Hip fracture, right, closed, initial encounter        Nelva Nayobert Naryah Clenney, MD 02/25/15 1547

## 2015-02-25 NOTE — H&P (Signed)
Date: 02/25/2015               Patient Name:  Jill [name] MRN: [id number]  DOB: 06/19/1929 Age / Sex: 81 y.o., female   PCP: No Pcp Per Patient         Medical Service: Internal Medicine Teaching Service         Attending Physician: Dr. Aletta Edouard, MD    First Contact: Dr. Isabella Bowens Pager: [phone]  Second Contact: Dr. Johna Roles Pager: [phone]       After Hours (After 5p/  First Contact Pager: [phone]  weekends / holidays): Second Contact Pager: [phone]   Chief Complaint: Fall  History of Present Illness: Ms. Jill [name] is an 81 year old woman with history of dementia presenting after mechanical fall. She was in the bathtub this morning and slipped getting out of the tub. Her daughter was in a different room but came into the bathroom shortly after. She denied that her mother had LOC. She reports her mother's mental status is stable. Denies facial droop. Her health was otherwise normal prior to this. Patient unable to give history due to dementia.  Meds: Current Facility-Administered Medications  Medication Dose Route Frequency Provider Last Rate Last Dose  . 0.9 %  sodium chloride infusion   Intravenous STAT Nelva Nay, MD 100 mL/hr at 02/25/15 1607    . HYDROmorphone (DILAUDID) injection 0.5 mg  0.5 mg Intravenous Q4H PRN Nelva Nay, MD      . ondansetron [facility]) injection 4 mg  4 mg Intravenous Q8H PRN Nelva Nay, MD       Current Outpatient Prescriptions  Medication Sig Dispense Refill  . aspirin EC 81 MG EC tablet Take 1 tablet (81 mg total) by mouth daily. 30 tablet 0  . atorvastatin (LIPITOR) 20 MG tablet Take 1 tablet (20 mg total) by mouth daily at 6 PM. 30 tablet 0  . diazepam (VALIUM) 2 MG tablet Take 1 tablet (2 mg total) by mouth every 8 (eight) hours as needed for anxiety. 30 tablet 0  . traZODone (DESYREL) 50 MG tablet Take 2 tablets (100 mg total) by mouth at bedtime as needed for sleep. 30 tablet 0    Allergies: Allergies as of 02/25/2015  .  (No Known Allergies)   Past Medical History  Diagnosis Date  . Dementia    History reviewed. No pertinent past surgical history. History reviewed. No pertinent family history. History   Social History  . Marital Status: Married    Spouse Name: N/A  . Number of Children: N/A  . Years of Education: N/A   Occupational History  . Not on file.   Social History Main Topics  . Smoking status: Not on file  . Smokeless tobacco: Not on file  . Alcohol Use: Not on file  . Drug Use: Not on file  . Sexual Activity: Not on file   Other Topics Concern  . Not on file   Social History Narrative    Review of Systems: Unable to obtain ROS due to mental status  Physical Exam: Blood pressure 146/57, pulse 75, temperature 97.4 F (36.3 C), temperature source Axillary, resp. rate 10, SpO2 92 %. General Apperance: NAD Head: Normocephalic, atraumatic Eyes: PERRL, anicteric sclera Ears: Normal external ear canal Nose: Nares normal, septum midline, mucosa normal Throat: Lips, mucosa and tongue normal  Neck: Supple, trachea midline Back: No tenderness or bony abnormality  Lungs: Clear to auscultation bilaterally. No wheezes, rhonchi or rales. Breathing comfortably Chest Wall: Nontender, no deformity  Heart: Regular rate and rhythm, no murmur/rub/gallop Abdomen: Soft, nontender, nondistended, no rebound/guarding Extremities: Warm and well perfused, no edema, R leg shortening, tender to palpation at hip and with motion Pulses: 2+ throughout Skin: No rashes or lesions Neurologic: Alert, unable to answer questions or follow commands, moving extremities spontaneously  Lab results: Basic Metabolic Panel:  Recent Labs  78/29/56 1502 02/25/15 1509  NA 138 140  K 4.1 4.2  CL 100* 100*  CO2 30  --   GLUCOSE 117* 114*  BUN 14 17  CREATININE 0.81 0.90  CALCIUM 9.2  --    CBC:  Recent Labs  02/25/15 1347 02/25/15 1509  WBC 12.2*  --   NEUTROABS 11.0*  --   HGB 12.6 13.6  HCT  39.0 40.0  MCV 91.1  --   PLT 301  --    Urine Drug Screen: Drugs of Abuse     Component Value Date/Time   LABOPIA NONE DETECTED 07/28/2014 1340   COCAINSCRNUR NONE DETECTED 07/28/2014 1340   LABBENZ POSITIVE* 07/28/2014 1340   AMPHETMU NONE DETECTED 07/28/2014 1340   THCU NONE DETECTED 07/28/2014 1340   LABBARB NONE DETECTED 07/28/2014 1340    Imaging results:  Dg Chest Portable 1 View  02/25/2015   CLINICAL DATA:  Larey Seat this morning, hip fracture, history Alzheimer's  EXAM: PORTABLE CHEST - 1 VIEW  COMPARISON:  Portable exam 1332 hours without priors for comparison  FINDINGS: Normal heart size, mediastinal contours and pulmonary vascularity.  At tract calcification aorta.  Lungs clear.  No pleural effusion or pneumothorax.  Bones diffusely demineralized.  IMPRESSION: No acute abnormalities.   Electronically Signed   By: Ulyses Southward M.D.   On: 02/25/2015 13:47   Dg Hip Unilat  With Pelvis 2-3 Views Right  02/25/2015   CLINICAL DATA:  Fall.  Pain.  EXAM: RIGHT HIP (WITH PELVIS) 2-3 VIEWS  COMPARISON:  None.  FINDINGS: Mildly impacted acute femoral neck fracture on the right. No fracture of the bones of the pelvis. Intertrochanteric region appears intact.  IMPRESSION: Slightly impacted femoral neck fracture on the right.   Electronically Signed   By: Paulina Fusi M.D.   On: 02/25/2015 12:41    Other results: EKG: normal sinus rhythm, no changes from previous EKG  Assessment & Plan by Problem: Active Problems:   Hip fracture  Hip fracture: CXR without acute abnormalities. R Hip XR with slightly impacted femoral neck fracture on R. Leukocytosis likely 2/2 hip fracture. Ortho consulted by ED. -Right hemiarthroplasty -Tylenol prn pain -Dilaudid 0.25mg  Q2 hr prn pain -Check Vit D level  Dementia: Continue home valium 2mg  q8hr prn anxiety and trazodone 100mg  QHS prn sleep  FEN:  -NPO -NS @ 145ml/hr  VTE ppx: SCDs  Dispo: Disposition is deferred at this time, awaiting improvement  of current medical problems. Anticipated discharge in approximately 1-2 day(s).   The patient does have a current PCP (No Pcp Per Patient) and does not need an [facility] hospital follow-up appointment after discharge.  The patient does not know have transportation limitations that hinder transportation to clinic appointments.  Signed: Lora Paula, MD PGY-1 02/25/2015, 4:09 PM

## 2015-02-25 NOTE — Interval H&P Note (Signed)
History and Physical Interval Note:  02/25/2015 8:03 PM  Jill [name]  has presented today for surgery, with the diagnosis of right femoral neck fracture  The various methods of treatment have been discussed with the patient and family. After consideration of risks, benefits and other options for treatment, the patient has consented to  Procedure(s): ARTHROPLASTY BIPOLAR HIP (HEMIARTHROPLASTY) (Right) as a surgical intervention .  The patient's history has been reviewed, patient examined, no change in status, stable for surgery.  I have reviewed the patient's chart and labs.  Questions were answered to the patient's satisfaction.     [name] C

## 2015-02-25 NOTE — Anesthesia Preprocedure Evaluation (Addendum)
Anesthesia Evaluation  Patient identified by MRN, date of birth, ID band Patient awake    Reviewed: Allergy & Precautions, NPO status , Patient's Chart, lab work & pertinent test results  Airway Mallampati: II  TM Distance: >3 FB Neck ROM: Full    Dental no notable dental hx.    Pulmonary neg pulmonary ROS,  breath sounds clear to auscultation  Pulmonary exam normal       Cardiovascular negative cardio ROS Normal cardiovascular examRhythm:Regular Rate:Normal     Neuro/Psych Dementia  L sided weakness TIAnegative psych ROS   GI/Hepatic negative GI ROS, Neg liver ROS,   Endo/Other  negative endocrine ROS  Renal/GU negative Renal ROS  negative genitourinary   Musculoskeletal negative musculoskeletal ROS (+)   Abdominal   Peds negative pediatric ROS (+)  Hematology negative hematology ROS (+)   Anesthesia Other Findings   Reproductive/Obstetrics negative OB ROS                            Anesthesia Physical Anesthesia Plan  ASA: III  Anesthesia Plan: General   Post-op Pain Management:    Induction: Intravenous  Airway Management Planned: Oral ETT  Additional Equipment:   Intra-op Plan:   Post-operative Plan: Extubation in OR  Informed Consent: I have reviewed the patients History and Physical, chart, labs and discussed the procedure including the risks, benefits and alternatives for the proposed anesthesia with the patient or authorized representative who has indicated his/her understanding and acceptance.   Dental advisory given  Plan Discussed with: CRNA and Surgeon  Anesthesia Plan Comments:         Anesthesia Quick Evaluation

## 2015-02-25 NOTE — Anesthesia Procedure Notes (Signed)
Procedure Name: Intubation Date/Time: 02/25/2015 8:23 PM Performed by: Dedee Liss S Pre-anesthesia Checklist: Patient identified, Emergency Drugs available, Suction available, Patient being monitored and Timeout performed Patient Re-evaluated:Patient Re-evaluated prior to inductionOxygen Delivery Method: Circle system utilized Preoxygenation: Pre-oxygenation with 100% oxygen Intubation Type: IV induction Ventilation: Mask ventilation without difficulty Laryngoscope Size: Mac and 3 Grade View: Grade I Tube type: Oral Tube size: 7.0 mm Number of attempts: 1 Airway Equipment and Method: Stylet Placement Confirmation: ETT inserted through vocal cords under direct vision,  positive ETCO2 and breath sounds checked- equal and bilateral Secured at: 22 cm Tube secured with: Tape Dental Injury: Teeth and Oropharynx as per pre-operative assessment

## 2015-02-25 NOTE — H&P (View-Only) (Signed)
Jill HazardVirginia Celli MRN: [id number] DOB/AGE: 1929-07-09 81 y.o.  Admit date: 02/25/2015  Admission Diagnoses:  Active Problems:   Hip fracture dementia  HPI: Patient with a history of dementia presented to the ED today after a fall in her home.   Poor historian.  Family member present. Normally ambulates at home but suffered a fall today.  Pain right hip with attempted weightbearing.  No complaints of other injuries.   Past Medical History: Past Medical History  Diagnosis Date  . Dementia     Surgical History: History reviewed. No pertinent past surgical history.  Family History: History reviewed. No pertinent family history.  Social History: History   Social History  . Marital Status: Married    Spouse Name: N/A  . Number of Children: N/A  . Years of Education: N/A   Occupational History  . Not on file.   Social History Main Topics  . Smoking status: Not on file  . Smokeless tobacco: Not on file  . Alcohol Use: Not on file  . Drug Use: Not on file  . Sexual Activity: Not on file   Other Topics Concern  . Not on file   Social History Narrative    Allergies: Review of patient's allergies indicates no known allergies.  Medications: I have reviewed the patient's current medications.  Vital Signs: Patient Vitals for the past 24 hrs:  BP Temp Temp src Pulse Resp SpO2  02/25/15 1445 156/57 mmHg - - - 12 -  02/25/15 1430 121/71 mmHg - - (!) 52 11 93 %  02/25/15 1415 147/67 mmHg - - 62 (!) 9 (!) 86 %  02/25/15 1400 157/63 mmHg - - 83 18 100 %  02/25/15 1346 170/56 mmHg 97.4 F (36.3 C) Axillary (!) 54 11 100 %  02/25/15 1345 170/56 mmHg - - (!) 47 12 95 %  02/25/15 1330 124/60 mmHg - - (!) 51 10 98 %  02/25/15 1315 161/87 mmHg - - (!) 43 12 98 %  02/25/15 1300 168/56 mmHg - - (!) 47 13 98 %  02/25/15 1245 162/56 mmHg - - (!) 43 (!) 9 98 %  02/25/15 1230 169/57 mmHg - - (!) 51 11 92 %  02/25/15 1224 153/57 mmHg - - (!) 42 15 99 %  02/25/15 1145 156/90 mmHg -  - (!) 44 12 100 %  02/25/15 1137 155/86 mmHg (!) 96.6 F (35.9 C) Temporal (!) 54 12 98 %    Radiology: Dg Chest Portable 1 View  02/25/2015   CLINICAL DATA:  Larey SeatFell this morning, hip fracture, history Alzheimer's  EXAM: PORTABLE CHEST - 1 VIEW  COMPARISON:  Portable exam 1332 hours without priors for comparison  FINDINGS: Normal heart size, mediastinal contours and pulmonary vascularity.  At tract calcification aorta.  Lungs clear.  No pleural effusion or pneumothorax.  Bones diffusely demineralized.  IMPRESSION: No acute abnormalities.   Electronically Signed   By: Ulyses SouthwardMark  Boles M.D.   On: 02/25/2015 13:47   Dg Hip Unilat  With Pelvis 2-3 Views Right  02/25/2015   CLINICAL DATA:  Fall.  Pain.  EXAM: RIGHT HIP (WITH PELVIS) 2-3 VIEWS  COMPARISON:  None.  FINDINGS: Mildly impacted acute femoral neck fracture on the right. No fracture of the bones of the pelvis. Intertrochanteric region appears intact.  IMPRESSION: Slightly impacted femoral neck fracture on the right.   Electronically Signed   By: Paulina FusiMark  Shogry M.D.   On: 02/25/2015 12:41    Labs:  Recent Labs  02/25/15 1347  02/25/15 1509  WBC 12.2*  --   RBC 4.28  --   HCT 39.0 40.0  PLT 301  --     Recent Labs  02/25/15 1502 02/25/15 1509  NA 138 140  K 4.1 4.2  CL 100* 100*  CO2 30  --   BUN 14 17  CREATININE 0.81 0.90  GLUCOSE 117* 114*  CALCIUM 9.2  --    No results for input(s): LABPT, INR in the last 72 hours.  Review of Systems: Review of Systems  Unable to perform ROS: dementia  Musculoskeletal: Positive for joint pain.    Physical Exam: Does not respond verbally to questioning. Does moan from pain.  No increase in respiratory effort.  Neurologically intact ABD soft Neurovascular intact Compartment soft Head is normocephalic and atraumatic EOM intact.  Oral mucosa pink and moist.  Cervical spine unremarkable Positive log roll right hip. Right le shortening. No edema.  Skin warm and dry.   Assessment and  Plan: Right hip fracture, dementia.  Discussed with family member that best treatment option at this point is right hip hemiarthroplasty.  Surgical procedure along with possible rehab/recovery time and risks/complications discussed.  All questions answered.   Genene Churn. Jeanella Craze For Dr Annell Greening [facility] [phone]

## 2015-02-26 ENCOUNTER — Encounter (HOSPITAL_COMMUNITY): Payer: Self-pay | Admitting: Orthopaedic Surgery

## 2015-02-26 LAB — COMPREHENSIVE METABOLIC PANEL
ALBUMIN: 2.8 g/dL — AB (ref 3.5–5.0)
ALK PHOS: 62 U/L (ref 38–126)
ALT: 17 U/L (ref 14–54)
AST: 23 U/L (ref 15–41)
Anion gap: 3 — ABNORMAL LOW (ref 5–15)
BILIRUBIN TOTAL: 0.5 mg/dL (ref 0.3–1.2)
BUN: 12 mg/dL (ref 6–20)
CHLORIDE: 100 mmol/L — AB (ref 101–111)
CO2: 31 mmol/L (ref 22–32)
Calcium: 8.3 mg/dL — ABNORMAL LOW (ref 8.9–10.3)
Creatinine, Ser: 0.88 mg/dL (ref 0.44–1.00)
GFR calc Af Amer: >60 mL/min (ref >60)
GFR calc non Af Amer: 59 mL/min — ABNORMAL LOW (ref >60)
Glucose, Bld: 135 mg/dL — ABNORMAL HIGH (ref 65–99)
POTASSIUM: 4.1 mmol/L (ref 3.5–5.1)
Sodium: 134 mmol/L — ABNORMAL LOW (ref 135–145)
Total Protein: 5.5 g/dL — ABNORMAL LOW (ref 6.5–8.1)

## 2015-02-26 LAB — TYPE AND SCREEN
ABO/RH(D): O NEG
ANTIBODY SCREEN: NEGATIVE

## 2015-02-26 LAB — CBC
HCT: 31.4 % — ABNORMAL LOW (ref 36.0–46.0)
Hemoglobin: 10.3 g/dL — ABNORMAL LOW (ref 12.0–15.0)
MCH: 30 pg (ref 26.0–34.0)
MCHC: 32.8 g/dL (ref 30.0–36.0)
MCV: 91.5 fL (ref 78.0–100.0)
PLATELETS: 241 10*3/uL (ref 150–400)
RBC: 3.43 MIL/uL — AB (ref 3.87–5.11)
RDW: 12.8 % (ref 11.5–15.5)
WBC: 6.6 10*3/uL (ref 4.0–10.5)

## 2015-02-26 LAB — HIV ANTIBODY (ROUTINE TESTING W REFLEX): HIV Screen 4th Generation wRfx: NONREACTIVE

## 2015-02-26 LAB — PROTIME-INR
INR: 1.11 (ref 0.00–1.49)
Prothrombin Time: 14.5 seconds (ref 11.6–15.2)

## 2015-02-26 LAB — MAGNESIUM: Magnesium: 1.8 mg/dL (ref 1.7–2.4)

## 2015-02-26 LAB — ABO/RH: ABO/RH(D): O NEG

## 2015-02-26 LAB — PHOSPHORUS: Phosphorus: 3.6 mg/dL (ref 2.5–4.6)

## 2015-02-26 LAB — APTT: APTT: 30 s (ref 24–37)

## 2015-02-26 NOTE — Progress Notes (Signed)
Subjective: S/p bipolar hemiarthroplasty last night. Sleeping this morning and appears comfortable.  Objective: Vital signs in last 24 hours: Filed Vitals:   02/25/15 2309 02/25/15 2310 02/26/15 0130 02/26/15 0435  BP:  157/104 155/80 133/89  Pulse:  78 72 91  Temp:  97.9 F (36.6 C) 98.1 F (36.7 C) 98.2 F (36.8 C)  TempSrc:   Oral Oral  Resp:  17 16 16   SpO2: 100% 100% 100% 100%   Weight change:   Intake/Output Summary (Last 24 hours) at 02/26/15 0809 Last data filed at 02/26/15 0630  Gross per 24 hour  Intake    750 ml  Output    775 ml  Net    -25 ml   General Apperance: NAD HEENT: Normocephalic, atraumatic, PERRL, anicteric sclera Neck: Supple, trachea midline Lungs: Clear to auscultation bilaterally. No wheezes, rhonchi or rales. Breathing comfortably Heart: Regular rate and rhythm, no murmur/rub/gallop Abdomen: Soft, nontender, nondistended, no rebound/guarding Extremities: Normal, warm and well perfused, no edema, Dressing in place over R hip with no strikethrough Pulses: 2+ throughout Skin: No rashes or lesions Neurologic: Sedated but opened eyes for daughter  Lab Results: Basic Metabolic Panel:  Recent Labs Lab 02/25/15 1502 02/25/15 1509  NA 138 140  K 4.1 4.2  CL 100* 100*  CO2 30  --   GLUCOSE 117* 114*  BUN 14 17  CREATININE 0.81 0.90  CALCIUM 9.2  --    CBC:  Recent Labs Lab 02/25/15 1347 02/25/15 1509  WBC 12.2*  --   NEUTROABS 11.0*  --   HGB 12.6 13.6  HCT 39.0 40.0  MCV 91.1  --   PLT 301  --    Urine Drug Screen: Drugs of Abuse     Component Value Date/Time   LABOPIA NONE DETECTED 07/28/2014 1340   COCAINSCRNUR NONE DETECTED 07/28/2014 1340   LABBENZ POSITIVE* 07/28/2014 1340   AMPHETMU NONE DETECTED 07/28/2014 1340   THCU NONE DETECTED 07/28/2014 1340   LABBARB NONE DETECTED 07/28/2014 1340    Studies/Results: Dg Chest Portable 1 View  02/25/2015   CLINICAL DATA:  Larey SeatFell this morning, hip fracture, history  Alzheimer's  EXAM: PORTABLE CHEST - 1 VIEW  COMPARISON:  Portable exam 1332 hours without priors for comparison  FINDINGS: Normal heart size, mediastinal contours and pulmonary vascularity.  At tract calcification aorta.  Lungs clear.  No pleural effusion or pneumothorax.  Bones diffusely demineralized.  IMPRESSION: No acute abnormalities.   Electronically Signed   By: Ulyses SouthwardMark  Boles M.D.   On: 02/25/2015 13:47   Dg Hip Port Unilat With Pelvis 1v Right  02/25/2015   CLINICAL DATA:  Status post right hip arthroplasty.  EXAM: RIGHT HIP (WITH PELVIS) 1 VIEW PORTABLE  COMPARISON:  Preoperative exam earlier this day.  FINDINGS: Right hip arthroplasty, the femoral stem is midline. Single cerclage wire about the intertrochanteric region. Recent postsurgical change includes air in the soft tissues and lateral skin staples.  IMPRESSION: Right hip arthroplasty without immediate postoperative complication.   Electronically Signed   By: Rubye OaksMelanie  Ehinger M.D.   On: 02/25/2015 22:18   Dg Hip Unilat  With Pelvis 2-3 Views Right  02/25/2015   CLINICAL DATA:  Fall.  Pain.  EXAM: RIGHT HIP (WITH PELVIS) 2-3 VIEWS  COMPARISON:  None.  FINDINGS: Mildly impacted acute femoral neck fracture on the right. No fracture of the bones of the pelvis. Intertrochanteric region appears intact.  IMPRESSION: Slightly impacted femoral neck fracture on the right.   Electronically Signed  By: Paulina Fusi M.D.   On: 02/25/2015 12:41   Medications: I have reviewed the patient's current medications. Scheduled Meds: . aspirin EC  325 mg Oral Q breakfast  . atorvastatin  20 mg Oral q1800  . bupivacaine liposome  20 mL Infiltration To NeurOR  . calcium carbonate  1 tablet Oral BID WC  . docusate sodium  100 mg Oral BID   Continuous Infusions:  PRN Meds:.acetaminophen **OR** acetaminophen, diazepam, HYDROmorphone (DILAUDID) injection, menthol-cetylpyridinium **OR** phenol, metoCLOPramide **OR** metoCLOPramide (REGLAN) injection, polyethylene  glycol, traZODone  Assessment/Plan: Active Problems:   Dementia, multi-infarct   Hip fracture   Diastolic dysfunction   Hyperlipidemia  Hip fracture: S/p bipolar hemiarthroplasty 02/25/2015. -Tylenol prn pain -Dilaudid 0.25mg  Q2 hr prn pain -Vit D level pending -PT/OT pending -ASA  daily -Calcium carbonate  BID  Dementia: Continue home valium  q8hr prn anxiety and trazodone  QHS prn sleep  FEN:  -Heart healthy -SLP consulted  VTE ppx: SCDs  Dispo: Disposition is deferred at this time, awaiting improvement of current medical problems.  Anticipated discharge in approximately 1-2 day(s).   The patient does have a current PCP (No Pcp Per Patient) and does not need an [facility] hospital follow-up appointment after discharge.  The patient does not have transportation limitations that hinder transportation to clinic appointments.  .Services Needed at time of discharge: Y = Yes, Blank = No PT:   OT:   RN:   Equipment:   Other:     LOS: 1 day   Lora Paula, MD 02/26/2015, 8:09 AM

## 2015-02-26 NOTE — Evaluation (Signed)
Physical Therapy Evaluation Patient Details Name: Jill [name] MRN: [id number] DOB: [birth year] Today's Date: 02/26/2015   History of Present Illness  Patient with a femoal neck fracture and dementia now s/p R himiarthroplasty on 02/25/15 after a fall on wet floors at home.  Clinical Impression  Patient awake and alert during session. Inconsistently following commands. Patient not responding verbally throughout session and no family present. Unable to determine prior level of function at this time. Able to assist patient from bed to chair with assist of +2.     Follow Up Recommendations SNF    Equipment Recommendations   (unknow at this time. )    Recommendations for Other Services       Precautions / Restrictions Precautions Precautions: Posterior Hip Required Braces or Orthoses: Knee Immobilizer - Right      Mobility  Bed Mobility Overal bed mobility: +2 for physical assistance                Transfers Overall transfer level: Needs assistance Equipment used: Rolling walker (2 wheeled) Transfers: Sit to/from Stand Sit to Stand: +2 physical assistance         General transfer comment: stand pivot turn to chair  Ambulation/Gait                Stairs            Wheelchair Mobility    Modified Rankin (Stroke Patients Only)       Balance Overall balance assessment: Needs assistance Sitting-balance support: Bilateral upper extremity supported Sitting balance-Leahy Scale: Poor   Postural control: Posterior lean                                   Pertinent Vitals/Pain Pain Assessment:  (No response to question, grimace with transfers) Pain Intervention(s): Monitored during session (with facial expressions)    Home Living Family/patient expects to be discharged to::  (patient not responding to questions, no family present)                      Prior Function Level of Independence:  (unknown)                Hand Dominance        Extremity/Trunk Assessment               Lower Extremity Assessment: Generalized weakness      Cervical / Trunk Assessment: Kyphotic  Communication   Communication:  (Inconsistent with following commands)  Cognition Arousal/Alertness: Awake/alert Behavior During Therapy: Flat affect Overall Cognitive Status: No family/caregiver present to determine baseline cognitive functioning                      General Comments      Exercises        Assessment/Plan    PT Assessment Patient needs continued PT services  PT Diagnosis Difficulty walking;Generalized weakness   PT Problem List Decreased strength;Decreased activity tolerance;Decreased mobility;Decreased safety awareness  PT Treatment Interventions Gait training;Functional mobility training;Therapeutic activities;Therapeutic exercise;Patient/family education   PT Goals (Current goals can be found in the Care Plan section) Acute Rehab PT Goals Patient Stated Goal:  (patient unable to report)    Frequency Min 3X/week   Barriers to discharge        Co-evaluation               End of Session Equipment Utilized  During Treatment: Gait belt;Right knee immobilizer Activity Tolerance: Patient tolerated treatment well Patient left: in chair;with call bell/phone within reach;with chair alarm set Nurse Communication: Mobility status (up in chair)         Time: [id number] PT Time Calculation (min) (ACUTE ONLY): 27 min   Charges:   PT Evaluation $Initial PT Evaluation Tier I: 1 Procedure PT Treatments $Therapeutic Activity: 8-22 mins   PT G Codes:        Christiane HaBenjamin J. Malaiyah Achorn, PT, CSCS Pager [phone] Office [phone]  02/26/2015, 3:03 PM

## 2015-02-26 NOTE — Op Note (Signed)
[name]:  [name], Jill [name]              ACCOUNT NO.:  [id number]  MEDICAL RECORD NO.:  [id number]  LOCATION:  [id number]                        FACILITY:  [facility]  PHYSICIAN:  Anjoli Diemer C. Ophelia CharterYates, M.D.    DATE OF BIRTH:  17-Jun-1929  DATE OF PROCEDURE:  02/25/2015 DATE OF DISCHARGE:                              OPERATIVE REPORT   PREOPERATIVE DIAGNOSIS:  Right femoral neck fracture with dementia.  POSTOPERATIVE DIAGNOSIS:  Right femoral neck fracture with dementia.  PROCEDURE:  Right press-fit bipolar hemiarthroplasty.  SURGEON:  Kaylenn Civil C. Ophelia CharterYates, M.D.  ASSISTANT:  Genene ChurnJames M. Denton Meekwens, P.A., medically necessary and present for the entire procedure.  ESTIMATED BLOOD LOSS:  Less than 100 mL.  DESCRIPTION OF PROCEDURE:  After induction of general anesthesia, orotracheal intubation, the patient was placed in lateral position, axillary roll.  Right hip was up, marked 7 frame holder securely.  10/15 drape applied, DuraPrep, impervious stockinette, split sheets, drapes, sterile skin marker, Betadine stain drape x2, sealing the skin after split sheets, and time-out procedure.  Posterior approach was made. Gluteus maximus was split.  The patient was very thin.  There was some significant trochanteric bursal sac present.  Piriformis was tagged and cut.  Posterior capsule was opened.  Hematoma evacuated.  Hip was dislocated.  Neck cut 1 fingerbreadth above the trochanter neck cut, ended up being a little higher than this.  It was cut slightly shorter, so that it was down to 10 mm.  Preparation with trochanter reamer, lateralizer, sequential broaching.  #2 broach fit and the #2 prosthesis was being put down.  Small crack occurred in the calcar.  18-gauge wire was placed around it.  The collar was down on the neck and the crack did not extend past the lesser trochanter.  18-gauge wire was tightened down securely with a Cobb handle and 2 large wire grabbers used to tighten it with a fulcrum, tying down on the square  knot.  Wire was tight. Fracture was in good position, stable.  The +1.5 neck and bipolar was popped on.  Hip was reduced.  Stability flexion 90 degrees, internal rotation 80 degrees trace shuck.  Piriformis was repaired back gluteus medius with a free needle.  Tensor fascia, gluteus maximus fascia were closed, repaired, and subcutaneous tissue closed followed by Exparel for postoperative analgesia.  Skin closure with staples and a knee immobilizer.  Instrument count and needle count were correct.     Aztlan Coll C. Ophelia CharterYates, M.D.    MCY/MEDQ  D:  02/25/2015  T:  02/26/2015  Job:  [id number]

## 2015-02-27 DIAGNOSIS — F0150 Vascular dementia without behavioral disturbance: Secondary | ICD-10-CM

## 2015-02-27 DIAGNOSIS — Z09 Encounter for follow-up examination after completed treatment for conditions other than malignant neoplasm: Secondary | ICD-10-CM

## 2015-02-27 DIAGNOSIS — W19XXXA Unspecified fall, initial encounter: Secondary | ICD-10-CM

## 2015-02-27 DIAGNOSIS — W19XXXS Unspecified fall, sequela: Secondary | ICD-10-CM

## 2015-02-27 DIAGNOSIS — E43 Unspecified severe protein-calorie malnutrition: Secondary | ICD-10-CM | POA: Insufficient documentation

## 2015-02-27 LAB — BASIC METABOLIC PANEL
ANION GAP: 7 (ref 5–15)
BUN: 12 mg/dL (ref 6–20)
CALCIUM: 9 mg/dL (ref 8.9–10.3)
CO2: 32 mmol/L (ref 22–32)
CREATININE: 0.89 mg/dL (ref 0.44–1.00)
Chloride: 97 mmol/L — ABNORMAL LOW (ref 101–111)
GFR, EST NON AFRICAN AMERICAN: 58 mL/min — AB (ref >60)
GLUCOSE: 127 mg/dL — AB (ref 65–99)
Potassium: 4 mmol/L (ref 3.5–5.1)
Sodium: 136 mmol/L (ref 135–145)

## 2015-02-27 LAB — CBC
HCT: 29.3 % — ABNORMAL LOW (ref 36.0–46.0)
HEMOGLOBIN: 9.3 g/dL — AB (ref 12.0–15.0)
MCH: 29.2 pg (ref 26.0–34.0)
MCHC: 31.7 g/dL (ref 30.0–36.0)
MCV: 91.8 fL (ref 78.0–100.0)
Platelets: 210 10*3/uL (ref 150–400)
RBC: 3.19 MIL/uL — AB (ref 3.87–5.11)
RDW: 12.9 % (ref 11.5–15.5)
WBC: 6.5 10*3/uL (ref 4.0–10.5)

## 2015-02-27 LAB — VITAMIN D 25 HYDROXY (VIT D DEFICIENCY, FRACTURES): VIT D 25 HYDROXY: 23.9 ng/mL — AB (ref 30.0–100.0)

## 2015-02-27 LAB — PREALBUMIN: PREALBUMIN: 13.4 mg/dL — AB (ref 18–38)

## 2015-02-27 MED ORDER — DOCUSATE SODIUM 100 MG PO CAPS
100.0000 mg | ORAL_CAPSULE | Freq: Two times a day (BID) | ORAL | Status: DC
  Filled 2015-02-27: qty 1

## 2015-02-27 MED ORDER — CALCIUM CARBONATE 1250 (500 CA) MG PO TABS
1.0000 | ORAL_TABLET | Freq: Every day | ORAL | Status: DC
  Administered 2015-02-28: 500 mg via ORAL
  Filled 2015-02-27 (×2): qty 1

## 2015-02-27 MED ORDER — DOCUSATE SODIUM 50 MG/5ML PO LIQD
100.0000 mg | Freq: Two times a day (BID) | ORAL | Status: DC
  Filled 2015-02-27: qty 10

## 2015-02-27 MED ORDER — ENSURE ENLIVE PO LIQD
237.0000 mL | Freq: Two times a day (BID) | ORAL | Status: DC
  Administered 2015-02-27 – 2015-02-28 (×2): 237 mL via ORAL

## 2015-02-27 MED ORDER — ASPIRIN EC 325 MG PO TBEC: 325.0000 mg | DELAYED_RELEASE_TABLET | Freq: Every day | ORAL | Status: DC

## 2015-02-27 MED ORDER — VITAMIN D 1000 UNITS PO TABS
1000.0000 [IU] | ORAL_TABLET | Freq: Every day | ORAL | Status: DC
  Administered 2015-02-28: 1000 [IU] via ORAL
  Filled 2015-02-27: qty 1

## 2015-02-27 MED ORDER — ACETAMINOPHEN 500 MG PO TABS: 500.0000 mg | ORAL_TABLET | Freq: Four times a day (QID) | ORAL | Status: DC | PRN

## 2015-02-27 NOTE — Discharge Instructions (Signed)
Partial Hip Replacement The hip joint attaches the leg to the pelvis. The joint moves and supports you when you walk. The top of the thighbone (femoral head) is in the shape of a ball. Part of the lower pelvis (acetabulum) forms a cup-shaped socket. The ball and socket are attached with bands called ligaments. Smooth tissue (cartilage) lines the surface of the ball and socket to keep the joint cushioned and to help it move. Lubricating fluid (synovial fluid) also covers joint surfaces to keep the joint healthy. A partial hip replacement most often refers to a surgery to replace only the ball part of the joint. A man-made metal material (prosthesis) replaces the worn femoral head. This surgery is often performed to alleviate the pain of arthritis or a break (fracture). It is most commonly performed for a fractured hip. LET YOUR CAREGIVER KNOW ABOUT:   Allergies to food or medicine.  Medicines taken, including vitamins, herbs, eyedrops, over-the-counter medicines, and creams.  Use of steroids (by mouth or creams).  Previous problems with anesthetics or numbing medicines.  History of bleeding problems or blood clots.  Previous surgery.  Other health problems, including diabetes and kidney problems.  Possibility of pregnancy, if this applies. RISKS AND COMPLICATIONS  As with any surgery, complications may occur. However, they can usually be managed by your caregiver. General surgical complications may include:  Reaction to anesthesia.  Damage to surrounding nerves, tissues, or structures.  Infection.  Blood clot.  Bleeding.  Scarring. Complications related to this surgery may include:  Prosthesis not staying attached to the bone.  Joint dislocation.  Continued stiffness or loss of mobility.  Continued pain. BEFORE THE PROCEDURE  It is important to follow your caregiver's instructions prior to your surgery to avoid complications. Steps before your surgery may  include:  Physical exam, blood and urine tests, X-rays, a computerized magnetic scan (MRI), bone scans, or heart tests (cardiogram or chest X-ray).  Your caregiver will review with you the surgery, the anesthesia being used, and what to expect after the surgery. You may be asked to:  Stop taking certain medicines for several days prior to your surgery, such as blood thinners (including aspirin). Your caregiver will let you know which medicines you may continue.  Lose weight.  Seek treatment for any dental conditions.  Avoid eating and drinking at least 8 hours before the surgery. This will help you avoid complications from anesthesia.  Take an antibacterial shower the night before or the morning of the surgery.  Quit smoking, if this applies. Smoking increases the chances of a healing problem after your surgery. If you are thinking about quitting, ask your surgeon how long before the surgery you should stop smoking. Ask your primary caregiver about approaches to help you stop. This can include medicines.  Arrange for someone to help you with activities during recovery. Talk with your caregiver about the need for extended care in a facility. PROCEDURE  The surgery is done after you are given medicine that makes you sleep (general anesthetic) or medicine that makes you numb from the waist down (spinal anesthetic). You will be asleep and will not feel any pain. The surgeon will make a cut (incision) in the hip to access the joint. The top of the thighbone that contains the ball is removed. The surgeon secures a new prosthetic ball joint into the healthy socket. The nearby bone may grow into the prosthesis to hold it in place, or cement (methacrylate) may be used. The incision is closed  with stitches (sutures). The surgery will take several hours to complete. AFTER THE PROCEDURE   You will most likely be in the hospital for 2 to 4 days following surgery.  You will need to lie in bed and only  move as directed.  Caregivers will help you manage pain and swelling with medicines.  You will be asked to perform breathing exercises.  A splint or brace may be applied to the hip.  You will learn specific exercises and how to walk with support. This happens within hours after surgery or the following day. A physical therapist will help you.  You will need to follow your caregiver's instructions for home care after surgery.  Recovery generally takes 3 to 6 months.  You may be asked to limit how often you bend at the waist for 6 weeks. Document Released: 02/04/2010 Document Revised: 11/09/2011 Document Reviewed: 12/19/2013 [facility] Patient Information 2015 [place], [place]. This information is not intended to replace advice given to you by your health care provider. Make sure you discuss any questions you have with your health care provider.  INSTRUCTIONS AFTER JOINT REPLACEMENT   o Remove items at home which could result in a fall. This includes throw rugs or furniture in walking pathways o ICE to the affected joint every three hours while awake for 30 minutes at a time, for at least the first 3-5 days, and then as needed for pain and swelling.  Continue to use ice for pain and swelling. You may notice swelling that will progress down to the foot and ankle.  This is normal after surgery.  Elevate your leg when you are not up walking on it.   o Continue to use the breathing machine you got in the hospital (incentive spirometer) which will help keep your temperature down.  It is common for your temperature to cycle up and down following surgery, especially at night when you are not up moving around and exerting yourself.  The breathing machine keeps your lungs expanded and your temperature down.   DIET:  As you were doing prior to hospitalization, we recommend a well-balanced diet.  DRESSING / WOUND CARE / SHOWERING  You may change your dressing 3-5 days after surgery.  Then change the  dressing every day with sterile gauze.  Please use good hand washing techniques before changing the dressing.  Do not use any lotions or creams on the incision until instructed by your surgeon.  ACTIVITY  o Increase activity slowly as tolerated, but follow the weight bearing instructions below.   o No driving for 6 weeks or until further direction given by your physician.  You cannot drive while taking narcotics.  o No lifting or carrying greater than 10 lbs. until further directed by your surgeon. o Avoid periods of inactivity such as sitting longer than an hour when not asleep. This helps prevent blood clots.  o You may return to work once you are authorized by your doctor.     WEIGHT BEARING   Weight bearing as tolerated with assist device (walker, cane, etc) as directed, use it as long as suggested by your surgeon or therapist, typically at least 4-6 weeks.   EXERCISES  Results after joint replacement surgery are often greatly improved when you follow the exercise, range of motion and muscle strengthening exercises prescribed by your doctor. Safety measures are also important to protect the joint from further injury. Any time any of these exercises cause you to have increased pain or swelling, decrease  what you are doing until you are comfortable again and then slowly increase them. If you have problems or questions, call your caregiver or physical therapist for advice.   Rehabilitation is important following a joint replacement. After just a few days of immobilization, the muscles of the leg can become weakened and shrink (atrophy).  These exercises are designed to build up the tone and strength of the thigh and leg muscles and to improve motion. Often times heat used for twenty to thirty minutes before working out will loosen up your tissues and help with improving the range of motion but do not use heat for the first two weeks following surgery (sometimes heat can increase post-operative  swelling).   These exercises can be done on a training (exercise) mat, on the floor, on a table or on a bed. Use whatever works the best and is most comfortable for you.    Use music or television while you are exercising so that the exercises are a pleasant break in your day. This will make your life better with the exercises acting as a break in your routine that you can look forward to.   Perform all exercises about fifteen times, three times per day or as directed.  You should exercise both the operative leg and the other leg as well.  Exercises include:    Quad Sets - Tighten up the muscle on the front of the thigh (Quad) and hold for 5-10 seconds.    Straight Leg Raises - With your knee straight (if you were given a brace, keep it on), lift the leg to 60 degrees, hold for 3 seconds, and slowly lower the leg.  Perform this exercise against resistance later as your leg gets stronger.   Leg Slides: Lying on your back, slowly slide your foot toward your buttocks, bending your knee up off the floor (only go as far as is comfortable). Then slowly slide your foot back down until your leg is flat on the floor again.   Angel Wings: Lying on your back spread your legs to the side as far apart as you can without causing discomfort.   Hamstring Strength:  Lying on your back, push your heel against the floor with your leg straight by tightening up the muscles of your buttocks.  Repeat, but this time bend your knee to a comfortable angle, and push your heel against the floor.  You may put a pillow under the heel to make it more comfortable if necessary.   A rehabilitation program following joint replacement surgery can speed recovery and prevent re-injury in the future due to weakened muscles. Contact your doctor or a physical therapist for more information on knee rehabilitation.    CONSTIPATION  Constipation is defined medically as fewer than three stools per week and severe constipation as less than  one stool per week.  Even if you have a regular bowel pattern at home, your normal regimen is likely to be disrupted due to multiple reasons following surgery.  Combination of anesthesia, postoperative narcotics, change in appetite and fluid intake all can affect your bowels.   YOU MUST use at least one of the following options; they are listed in order of increasing strength to get the job done.  They are all available over the counter, and you may need to use some, POSSIBLY even all of these options:    Drink plenty of fluids (prune juice may be helpful) and high fiber foods Colace 100 mg by mouth  twice a day  Senokot for constipation as directed and as needed Dulcolax (bisacodyl), take with full glass of water  Miralax (polyethylene glycol) once or twice a day as needed.  If you have tried all these things and are unable to have a bowel movement in the first 3-4 days after surgery call either your surgeon or your primary doctor.    If you experience loose stools or diarrhea, hold the medications until you stool forms back up.  If your symptoms do not get better within 1 week or if they get worse, check with your doctor.  If you experience "the worst abdominal pain ever" or develop nausea or vomiting, please contact the office immediately for further recommendations for treatment.   ITCHING:  If you experience itching with your medications, try taking only a single pain pill, or even half a pain pill at a time.  You can also use Benadryl over the counter for itching or also to help with sleep.   TED HOSE STOCKINGS:  Use stockings on both legs until for at least 2 weeks or as directed by physician office. They may be removed at night for sleeping.  MEDICATIONS:  See your medication summary on the After Visit Summary that nursing will review with you.  You may have some home medications which will be placed on hold until you complete the course of blood thinner medication.  It is important for  you to complete the blood thinner medication as prescribed.  PRECAUTIONS:  If you experience chest pain or shortness of breath - call 911 immediately for transfer to the hospital emergency department.   If you develop a fever greater that 101 F, purulent drainage from wound, increased redness or drainage from wound, foul odor from the wound/dressing, or calf pain - CONTACT YOUR SURGEON.                                                   FOLLOW-UP APPOINTMENTS:  If you do not already have a post-op appointment, please call the office for an appointment to be seen by your surgeon.  Guidelines for how soon to be seen are listed in your After Visit Summary, but are typically between 1-4 weeks after surgery.  OTHER INSTRUCTIONS:   Knee Replacement:  Do not place pillow under knee, focus on keeping the knee straight while resting. CPM instructions: 0-90 degrees, 2 hours in the morning, 2 hours in the afternoon, and 2 hours in the evening. Place foam block, curve side up under heel at all times except when in CPM or when walking.  DO NOT modify, tear, cut, or change the foam block in any way.  MAKE SURE YOU:   Understand these instructions.   Get help right away if you are not doing well or get worse.    Thank you for letting us be a part of your medical care team.  It is a privilege we respect greatly.  We hope these instructions will help you stay on track for a fast and full recovery!

## 2015-02-27 NOTE — Progress Notes (Signed)
Initial Nutrition Assessment  DOCUMENTATION CODES:  Severe malnutrition in context of chronic illness, Underweight  INTERVENTION:  Ensure Enlive (each supplement provides 350kcal and 20 grams of protein) (BID)   Encourage adequate PO intake.   NUTRITION DIAGNOSIS:  Malnutrition related to chronic illness as evidenced by severe depletion of body fat, severe depletion of muscle mass.  GOAL:  Patient will meet greater than or equal to 90% of their needs  MONITOR:  PO intake, Supplement acceptance, Weight trends, Labs, I & O's  REASON FOR ASSESSMENT:  Consult Assessment of nutrition requirement/status  ASSESSMENT: Pt with history of dementia presenting after mechanical fall.  PROCEDURE: (6/27): ARTHROPLASTY BIPOLAR HIP (HEMIARTHROPLASTY) (Right)  Daughter at bedside. She reports pt is usually non-verbal. Meal completion this AM was 25%. Daughter reports pt usually eats well at home with 3 meals a day along with snacks and an Ensure shake in between. Usual body weight reported to be ~97 lbs. Noted no recent weight recorded. Pt was weighed on bed scale which revealed 101 lbs. RD to order Ensure to aid in caloric and protein needs. Daughter reports she has been encouraging pt to eat her food at meals.  Nutrition-Focused physical exam completed. Findings are severe fat depletion, severe muscle depletion, and mild edema.   Labs and medications reviewed.   Height:  Ht Readings from Last 1 Encounters:  02/27/15 5\' 2"  (1.575 m)    Weight:  Wt Readings from Last 1 Encounters:  02/27/15 101 lb (45.813 kg)    Ideal Body Weight:  50 kg  Wt Readings from Last 10 Encounters:  02/27/15 101 lb (45.813 kg)  07/30/14 95 lb 7.4 oz (43.3 kg)    BMI:  Body mass index is 18.47 kg/(m^2).  Estimated Nutritional Needs:  Kcal:  1400-1600  Protein:  60-75 grams  Fluid:  >/= 1.5 L/day  Skin:   (Incision on R hip, non-pitting RLE edema)  Diet Order:  DIET DYS 2 Room service  appropriate?: Yes; Fluid consistency:: Thin  EDUCATION NEEDS:  No education needs identified at this time   Intake/Output Summary (Last 24 hours) at 02/27/15 1302 Last data filed at 02/27/15 0344  Gross per 24 hour  Intake    120 ml  Output    500 ml  Net   -380 ml    Last BM:  PTA  Roslyn SmilingStephanie Deann Mclaine, MS, RD, LDN Pager # [phone] After hours/ weekend pager # [phone]

## 2015-02-27 NOTE — Progress Notes (Signed)
  PROGRESS NOTE MEDICINE TEACHING ATTENDING   Day 2 of stay Patient name: Jill [name]   Medical record number: [id number] Date of birth: September 13, 1928   Patient with daughter. Sleepy. Daughter says she ate mashed food without problem (assist fed).   Blood pressure 102/40, pulse 54, temperature 97.8 F (36.6 C), temperature source Oral, resp. rate 16, height 5\' 2"  (1.575 m), weight 101 lb (45.813 kg), SpO2 95 %. The patient is sedated/sleepy but arousable on verbal cues, comfortable, in no acute distress. PERRL, EOMI. Heart exhibits regular rate and rhythm, no murmurs. Lungs are clear to auscultation. Abdomen is soft and non-tender. There is no pedal edema and good pedal pulses. There are no gross focal neurological deficits apparent. Right hip bandage - dry.   Assessment/Plan S/p R hip arthroplasty - Recovering well. Surgical site dry. Cleared for discharge from Ortho. Appears to be sedated/sleepy during day although arouses better when daughter is present. RN Ricki Rodriguezdriana reported to me that the patient is awake at night. This could be due to advanced dementia sleep wake cycle reversal. Agree with aspirin prophylaxis.  Swallowing concern - the patient was evaluated by SLP and dysphagia 2 diet was recommended. Daughter aware, and is well in tune with the needs of the patient. The patient is non-verbal or minimally verbal at baseline.  The patient needs to work with physical therapy before discharge for their recommendations.   I have discussed the care of this patient with my IM team residents. Please see the resident note for details.  Amber Guthridge 02/27/2015, 2:09 PM.

## 2015-02-27 NOTE — Progress Notes (Signed)
Subjective: Evaluated patient at bedside this morning with RN present. Per RN, patient was agitated last night and received her prn trazodone at 0020. She is sleepy this morning. Appears comfortable otherwise. Was able to get to bedside commode with RN help this morning.  Objective: Vital signs in last 24 hours: Filed Vitals:   02/26/15 0435 02/26/15 1528 02/26/15 2200 02/27/15 0552  BP: 133/89 111/44 122/51 102/40  Pulse: 91 62 64 54  Temp: 98.2 F (36.8 C) 99.3 F (37.4 C) 98.3 F (36.8 C) 97.8 F (36.6 C)  TempSrc: Oral Axillary Oral   Resp: 16 12 13 16   SpO2: 100% 100% 96% 95%   Weight change:   Intake/Output Summary (Last 24 hours) at 02/27/15 0749 Last data filed at 02/27/15 0344  Gross per 24 hour  Intake    240 ml  Output    500 ml  Net   -260 ml   General Apperance: NAD HEENT: Normocephalic, atraumatic, PERRL, anicteric sclera Neck: Supple, trachea midline Lungs: Clear to auscultation bilaterally. No wheezes, rhonchi or rales. Breathing comfortably Heart: Regular rate and rhythm, no murmur/rub/gallop Abdomen: Soft, nontender, nondistended, no rebound/guarding Extremities: Normal, warm and well perfused, no edema, Dressing in place over R hip with minimal strikethrough Pulses: 2+ throughout Skin: No rashes or lesions Neurologic: Sleepy but arousable. Does not follow commands.  Lab Results: Basic Metabolic Panel:  Recent Labs Lab 02/26/15 0754 02/27/15 0428  NA 134* 136  K 4.1 4.0  CL 100* 97*  CO2 31 32  GLUCOSE 135* 127*  BUN 12 12  CREATININE 0.88 0.89  CALCIUM 8.3* 9.0  MG 1.8  --   PHOS 3.6  --    CBC:  Recent Labs Lab 02/25/15 1347  02/26/15 0754 02/27/15 0428  WBC 12.2*  --  6.6 6.5  NEUTROABS 11.0*  --   --   --   HGB 12.6  < > 10.3* 9.3*  HCT 39.0  < > 31.4* 29.3*  MCV 91.1  --  91.5 91.8  PLT 301  --  241 210  < > = values in this interval not displayed. Urine Drug Screen: Drugs of Abuse     Component Value Date/Time   LABOPIA NONE DETECTED 07/28/2014 1340   COCAINSCRNUR NONE DETECTED 07/28/2014 1340   LABBENZ POSITIVE* 07/28/2014 1340   AMPHETMU NONE DETECTED 07/28/2014 1340   THCU NONE DETECTED 07/28/2014 1340   LABBARB NONE DETECTED 07/28/2014 1340    Studies/Results: Dg Chest Portable 1 View  02/25/2015   CLINICAL DATA:  Larey SeatFell this morning, hip fracture, history Alzheimer's  EXAM: PORTABLE CHEST - 1 VIEW  COMPARISON:  Portable exam 1332 hours without priors for comparison  FINDINGS: Normal heart size, mediastinal contours and pulmonary vascularity.  At tract calcification aorta.  Lungs clear.  No pleural effusion or pneumothorax.  Bones diffusely demineralized.  IMPRESSION: No acute abnormalities.   Electronically Signed   By: Ulyses SouthwardMark  Boles M.D.   On: 02/25/2015 13:47   Dg Hip Port Unilat With Pelvis 1v Right  02/25/2015   CLINICAL DATA:  Status post right hip arthroplasty.  EXAM: RIGHT HIP (WITH PELVIS) 1 VIEW PORTABLE  COMPARISON:  Preoperative exam earlier this day.  FINDINGS: Right hip arthroplasty, the femoral stem is midline. Single cerclage wire about the intertrochanteric region. Recent postsurgical change includes air in the soft tissues and lateral skin staples.  IMPRESSION: Right hip arthroplasty without immediate postoperative complication.   Electronically Signed   By: Lujean RaveMelanie  Ehinger M.D.  On: 02/25/2015 22:18   Dg Hip Unilat  With Pelvis 2-3 Views Right  02/25/2015   CLINICAL DATA:  Fall.  Pain.  EXAM: RIGHT HIP (WITH PELVIS) 2-3 VIEWS  COMPARISON:  None.  FINDINGS: Mildly impacted acute femoral neck fracture on the right. No fracture of the bones of the pelvis. Intertrochanteric region appears intact.  IMPRESSION: Slightly impacted femoral neck fracture on the right.   Electronically Signed   By: Paulina Fusi M.D.   On: 02/25/2015 12:41   Medications: I have reviewed the patient's current medications. Scheduled Meds: . aspirin EC  325 mg Oral Q breakfast  . atorvastatin  20 mg Oral q1800  .  calcium carbonate  1 tablet Oral Q breakfast  . cholecalciferol  1,000 Units Oral Daily  . docusate sodium  100 mg Oral BID   Continuous Infusions:  PRN Meds:.acetaminophen **OR** acetaminophen, diazepam, HYDROmorphone (DILAUDID) injection, menthol-cetylpyridinium **OR** phenol, metoCLOPramide **OR** metoCLOPramide (REGLAN) injection, polyethylene glycol, traZODone  Assessment/Plan: Principal Problem:   Fracture of femoral neck, right Active Problems:   Dementia, multi-infarct   Diastolic dysfunction   Hyperlipidemia   Protein-calorie malnutrition, severe  Hip fracture: S/p bipolar hemiarthroplasty 02/25/2015. Vit D 23.9. PT recommending SNF. -Tylenol prn pain -Dilaudid 0.25mg  Q2 hr prn pain -OT pending -ASA  daily -Calcium carbonate  BID, Vitamin D 1000u daily  Dementia: Continue home valium  q8hr prn anxiety and trazodone  QHS prn sleep\  Protein-calorie malnutrition, severe: Ensure Enlive BID  FEN: Dysphagia 2  VTE ppx: SCDs, ASA  Dispo: Awaiting SNF placement  The patient does have a current PCP (No Pcp Per Patient) and does not need an [facility] hospital follow-up appointment after discharge.  The patient does not have transportation limitations that hinder transportation to clinic appointments.  .Services Needed at time of discharge: Y = Yes, Blank = No PT:   OT:   RN:   Equipment:   Other:     LOS: 2 days   Lora Paula, MD 02/27/2015, 7:49 AM

## 2015-02-27 NOTE — Progress Notes (Signed)
Speech Language Pathology     Patient Details Name: Jill HazardVirginia [name] MRN: [id number] DOB: 1928-12-26 Today's Date: 02/27/2015 Time: 0902-     Attempted swallow assessment. Unable to maintain alertness given max tactile and verbal cues over 5 min period. RN tech or RN to page this SLP once she is able to sustain alertness.   Breck CoonsLisa Willis WoodbridgeLitaker M.Ed [employer]-SLP Pager [phone]

## 2015-02-27 NOTE — Clinical Social Work Note (Signed)
Clinical Social Work Assessment  Patient Details  Name: Jill [name] MRN: [id number] Date of Birth: Mar 22, 1929  Date of referral:  02/27/15               Reason for consult:  Facility Placement, Discharge Planning                Permission sought to share information with:  Family Supports Permission granted to share information::     Name::     [place]::  [facility]  Relationship::  Daughter  Contact Information:  [phone]  Housing/Transportation Living arrangements for the past 2 months:  [place] of Information:  Adult Children Patient Interpreter Needed:  None Criminal Activity/Legal Involvement Pertinent to Current Situation/Hospitalization:  No - Comment as needed Significant Relationships:  Adult Children Lives with:  Adult Children Do you feel safe going back to the place where you live?  No (High fall risk.) Need for family participation in patient care:  Yes (Comment) (Patient disoriented, patient's daughter provides care.)  Care giving concerns:  Patient's daughter expressed no concerns at this time.   Social Worker assessment / plan:  CSW received referral for possible SNF placement at time of discharge. CSW met with patient's daughter outside of patient's room. Per patient's daughter, patient's family agreeable to SNF placement at time of discharge. CSW to complete SNF referral to [facility]. CSW to continue to follow and assist with discharge planning needs.  Employment status:  Retired Forensic scientist:  Medicare PT Recommendations:  [place] / Referral to community resources:  [place]  Patient/Family's Response to care:  Patient's daughter understanding and agreeable to CSW plan of care.  Patient/Family's Understanding of and Emotional Response to Diagnosis, Current Treatment, and Prognosis:  Patient's daughter understanding and agreeable to CSW plan of  care.  Emotional Assessment Appearance:  Other (Comment Required (CSW spoke with patient's daughter outside of patient's room.) Attitude/Demeanor/Rapport:  Other (CSW spoke with patient's daughter outside of patient's room.) Affect (typically observed):  Other (CSW spoke with patient's daughter outside of patient's room.) Orientation:    Alcohol / Substance use:  Not Applicable Psych involvement (Current and /or in the community):  No (Comment) (Not appropriate at this time.)  Discharge Needs  Concerns to be addressed:  No discharge needs identified Readmission within the last 30 days:  No Current discharge risk:  None Barriers to Discharge:  No Barriers Identified   Caroline Sauger, LCSW 02/27/2015, 3:58 PM [phone]

## 2015-02-27 NOTE — Care Management (Signed)
Important Message  Patient Details  Name: Jill HazardVirginia [name] MRN: [id number] Date of Birth: April 10, 1929   Medicare Important Message Given:  Yes-second notification given    Rayvon CharSTUTTS, [place] Yearby G 02/27/2015, 2:58 PM

## 2015-02-27 NOTE — Evaluation (Signed)
Clinical/Bedside Swallow Evaluation Patient Details  Name: Jill [name] MRN: [id number] Date of Birth: May 29, 1929  Today's Date: 02/27/2015 Time: SLP Start Time (ACUTE ONLY): 40980938 SLP Stop Time (ACUTE ONLY): 1008 SLP Time Calculation (min) (ACUTE ONLY): 30 min  Past Medical History:  Past Medical History  Diagnosis Date  . Dementia    Past Surgical History:  Past Surgical History  Procedure Laterality Date  . Hip arthroplasty Right 02/25/2015    Procedure: ARTHROPLASTY BIPOLAR HIP (HEMIARTHROPLASTY);  Surgeon: Eldred MangesMark C Yates, MD;  [facility];  Service: Orthopedics;  Laterality: Right;   HPI:  81 year old woman with history of dementia admitted after mechanical fall. Per chart daughter denies LOC. Xray revealed an impacted femoral neck fracture on the right and underwent right hip arthoplasty. CXR Lungs clear. No pleural effusion or pneumothorax. BSE performed 07/28/14 with mild oral dysphagia and no s/s aspiration and regular/thin recommended. RN reported pocketing and difficulty taking pills. Daughter present and states pt eats regular texture at home upright at table. She is primarily nonverbal and daughter denies recent pna.    Assessment / Plan / Recommendation Clinical Impression  Pt initially difficult to arouse for swallow assessment with SLP given max stimulation. Pt increased arousal when daughter arrived for po trials. Oral phase prolonged with decreased manipulation and delayed transit. No overt indications of laryngeal penetration or aspiration present, however she is at increased risk of silent aspiration at present given history of dementia, decreased arousal (pain meds) and inability to assess vocal quality. Educated daughter re: various textures and Dys 2/thin liquids recommended, no straws, eat only when alert, crush pills and swallow precautions (pt self feed if able, sit upright). ST will continue intervention for safe and efficient oral intake and upgrade solids when  appropriate.      Aspiration Risk  Moderate    Diet Recommendation Dysphagia 2 (Fine chop);Thin   Medication Administration: Crushed with puree Compensations: Slow rate;Small sips/bites;Check for pocketing    Other  Recommendations Oral Care Recommendations: Oral care BID   Follow Up Recommendations       Frequency and Duration min 2x/week  2 weeks   Pertinent Vitals/Pain none         Swallow Study       Oral/Motor/Sensory Function Overall Oral Motor/Sensory Function:  (not following commands, no obvious abnormalities)   Ice Chips Ice chips: Impaired Presentation: Spoon Oral Phase Functional Implications: Prolonged oral transit Pharyngeal Phase Impairments: Suspected delayed Swallow   Thin Liquid Thin Liquid: Impaired Presentation: Cup Oral Phase Impairments: Impaired anterior to posterior transit Oral Phase Functional Implications: Prolonged oral transit Pharyngeal  Phase Impairments: Suspected delayed Swallow    Nectar Thick Nectar Thick Liquid: Not tested   Honey Thick Honey Thick Liquid: Not tested   Puree Puree: Impaired Presentation: Spoon Oral Phase Functional Implications: Prolonged oral transit Pharyngeal Phase Impairments: Suspected delayed Swallow   Solid   GO    Solid: Not tested       Royce MacadamiaLitaker, [place] Linhart Willis 02/27/2015,10:38 AM  Breck CoonsLisa Willis Lonell FaceLitaker M.Ed [employer]-SLP Pager [phone]

## 2015-02-27 NOTE — Progress Notes (Signed)
Subjective: Resting comfortably.    Objective: Vital signs in last 24 hours: Temp:  [97.8 F (36.6 C)-99.3 F (37.4 C)] 97.8 F (36.6 C) (06/29 0552) Pulse Rate:  [54-64] 54 (06/29 0552) Resp:  [12-16] 16 (06/29 0552) BP: (102-122)/(40-51) 102/40 mmHg (06/29 0552) SpO2:  [95 %-100 %] 95 % (06/29 0552)  Intake/Output from previous day: 06/28 0701 - 06/29 0700 In: 240 [P.O.:240] Out: 500 [Urine:500] Intake/Output this shift:     Recent Labs  02/25/15 1347 02/25/15 1509 02/26/15 0754 02/27/15 0428  HGB 12.6 13.6 10.3* 9.3*    Recent Labs  02/26/15 0754 02/27/15 0428  WBC 6.6 6.5  RBC 3.43* 3.19*  HCT 31.4* 29.3*  PLT 241 210    Recent Labs  02/26/15 0754 02/27/15 0428  NA 134* 136  K 4.1 4.0  CL 100* 97*  CO2 31 32  BUN 12 12  CREATININE 0.88 0.89  GLUCOSE 135* 127*  CALCIUM 8.3* 9.0    Recent Labs  02/26/15 0754  INR 1.11    Exam:  Sleeping comfortably. Hip wound looks good.  Staples intact.  No drainage or signs of infection.  NVI.    Assessment/Plan: Stable from ortho standpoint.  Transfer to snf when medically stable. Scripts on chart for tylenol (pain) and aspirin (postop dvt prophylaxis).  Continue present care.    OWENS,JAMES M 02/27/2015, 7:59 AM

## 2015-02-28 DIAGNOSIS — N390 Urinary tract infection, site not specified: Secondary | ICD-10-CM

## 2015-02-28 LAB — CBC
HEMATOCRIT: 27.4 % — AB (ref 36.0–46.0)
Hemoglobin: 8.7 g/dL — ABNORMAL LOW (ref 12.0–15.0)
MCH: 29 pg (ref 26.0–34.0)
MCHC: 31.8 g/dL (ref 30.0–36.0)
MCV: 91.3 fL (ref 78.0–100.0)
PLATELETS: 224 10*3/uL (ref 150–400)
RBC: 3 MIL/uL — ABNORMAL LOW (ref 3.87–5.11)
RDW: 12.8 % (ref 11.5–15.5)
WBC: 6.8 10*3/uL (ref 4.0–10.5)

## 2015-02-28 LAB — URINE CULTURE: Culture: >=100000

## 2015-02-28 MED ORDER — DOCUSATE SODIUM 100 MG PO CAPS
100.0000 mg | ORAL_CAPSULE | Freq: Two times a day (BID) | ORAL | Status: DC
  Administered 2015-02-28: 100 mg via ORAL

## 2015-02-28 MED ORDER — DOCUSATE SODIUM 50 MG/5ML PO LIQD: 100.0000 mg | Freq: Two times a day (BID) | ORAL | Status: DC

## 2015-02-28 MED ORDER — CIPROFLOXACIN HCL 500 MG PO TABS
500.0000 mg | ORAL_TABLET | Freq: Two times a day (BID) | ORAL | Status: DC
  Administered 2015-02-28: 500 mg via ORAL
  Filled 2015-02-28: qty 1

## 2015-02-28 MED ORDER — DOCUSATE SODIUM 100 MG PO CAPS: 100.0000 mg | ORAL_CAPSULE | Freq: Two times a day (BID) | ORAL | Status: DC

## 2015-02-28 MED ORDER — CALCIUM CARBONATE 1250 (500 CA) MG PO TABS: 1.0000 | ORAL_TABLET | Freq: Every day | ORAL | Status: DC

## 2015-02-28 MED ORDER — ENSURE ENLIVE PO LIQD: 237.0000 mL | Freq: Two times a day (BID) | ORAL | Status: DC

## 2015-02-28 MED ORDER — POLYETHYLENE GLYCOL 3350 17 G PO PACK: 17.0000 g | PACK | Freq: Every day | ORAL | Status: DC | PRN

## 2015-02-28 MED ORDER — DIAZEPAM 2 MG PO TABS: 2.0000 mg | ORAL_TABLET | Freq: Three times a day (TID) | ORAL | Status: DC | PRN

## 2015-02-28 MED ORDER — CIPROFLOXACIN HCL 500 MG PO TABS: 500.0000 mg | ORAL_TABLET | Freq: Two times a day (BID) | ORAL | Status: DC

## 2015-02-28 MED ORDER — CHOLECALCIFEROL 25 MCG (1000 UT) PO TABS: 1000.0000 [IU] | ORAL_TABLET | Freq: Every day | ORAL | Status: DC

## 2015-02-28 NOTE — Progress Notes (Signed)
Subjective: She is having urinary retention and has been catheterized x 2 this morning and last night. Much more awake and alert this morning. Daughter at bedside and reports she is close to her baseline mental status.  Objective: Vital signs in last 24 hours: Filed Vitals:   02/27/15 1255 02/27/15 1449 02/27/15 2015 02/28/15 0609  BP:  133/55 148/65 155/70  Pulse:  62 79 63  Temp:  98.5 F (36.9 C) 97.7 F (36.5 C) 98.1 F (36.7 C)  TempSrc:  Oral Axillary Axillary  Resp:  16    Height: 5\' 2"  (1.575 m)     Weight: 101 lb (45.813 kg)     SpO2:  98% 100% 96%   Weight change:   Intake/Output Summary (Last 24 hours) at 02/28/15 [id number] Last data filed at 02/28/15 0700  Gross per 24 hour  Intake    100 ml  Output   1150 ml  Net  -1050 ml   General Apperance: NAD HEENT: Normocephalic, atraumatic, PERRL, anicteric sclera Neck: Supple, trachea midline Lungs: Clear to auscultation bilaterally. No wheezes, rhonchi or rales. Breathing comfortably Heart: Regular rate and rhythm, no murmur/rub/gallop Abdomen: Soft, nontender, nondistended, no rebound/guarding Extremities: Normal, warm and well perfused, no edema, Dressing in place over R hip with minimal strikethrough Pulses: 2+ throughout Skin: No rashes or lesions Neurologic: Does not follow commands. No gross deficits.  Lab Results: Basic Metabolic Panel:  Recent Labs Lab 02/26/15 0754 02/27/15 0428  NA 134* 136  K 4.1 4.0  CL 100* 97*  CO2 31 32  GLUCOSE 135* 127*  BUN 12 12  CREATININE 0.88 0.89  CALCIUM 8.3* 9.0  MG 1.8  --   PHOS 3.6  --    CBC:  Recent Labs Lab 02/25/15 1347  02/27/15 0428 02/28/15 0406  WBC 12.2*  < > 6.5 6.8  NEUTROABS 11.0*  --   --   --   HGB 12.6  < > 9.3* 8.7*  HCT 39.0  < > 29.3* 27.4*  MCV 91.1  < > 91.8 91.3  PLT 301  < > 210 224  < > = values in this interval not displayed. Urine Drug Screen: Drugs of Abuse     Component Value Date/Time   LABOPIA NONE DETECTED  07/28/2014 1340   COCAINSCRNUR NONE DETECTED 07/28/2014 1340   LABBENZ POSITIVE* 07/28/2014 1340   AMPHETMU NONE DETECTED 07/28/2014 1340   THCU NONE DETECTED 07/28/2014 1340   LABBARB NONE DETECTED 07/28/2014 1340    Studies/Results: No results found. Medications: I have reviewed the patient's current medications. Scheduled Meds: . aspirin EC  325 mg Oral Q breakfast  . atorvastatin  20 mg Oral q1800  . calcium carbonate  1 tablet Oral Q breakfast  . cholecalciferol  1,000 Units Oral Daily  . docusate sodium  100 mg Oral BID  . feeding supplement (ENSURE ENLIVE)  237 mL Oral BID BM   Continuous Infusions:  PRN Meds:.acetaminophen **OR** acetaminophen, diazepam, HYDROmorphone (DILAUDID) injection, menthol-cetylpyridinium **OR** phenol, metoCLOPramide **OR** metoCLOPramide (REGLAN) injection, polyethylene glycol, traZODone  Assessment/Plan: Principal Problem:   Fracture of femoral neck, right Active Problems:   Dementia, multi-infarct   Diastolic dysfunction   Hyperlipidemia   Protein-calorie malnutrition, severe   Fall   Postop check  Hip fracture: S/p bipolar hemiarthroplasty 02/25/2015. Vit D 23.9. PT recommending SNF. -Tylenol prn pain -Dilaudid 0.25mg  Q2 hr prn pain -ASA 325mg  daily -Calcium carbonate 500mg  BID, Vitamin D 1000u daily  Urinary tract infection: Patient with urine culture  growing >100,000 E. Coli. She had urinary retention and incontinence postoperatively. She will be treated with ciprofloxacin  every 12 hours for 3 days of antibiotics. We will forward sensitivity results to facility when they are available.  Dementia: Continue home valium  q8hr prn anxiety and trazodone  QHS prn sleep\  Protein-calorie malnutrition, severe: Ensure Enlive BID  FEN: Dysphagia 2  VTE ppx: SCDs, ASA  Dispo: Awaiting SNF placement  The patient does have a current PCP (No Pcp Per Patient) and does not need an [facility] hospital follow-up appointment after  discharge.  The patient does not have transportation limitations that hinder transportation to clinic appointments.  .Services Needed at time of discharge: Y = Yes, Blank = No PT:   OT:   RN:   Equipment:   Other:     LOS: 3 days   Lora Paula, MD 02/28/2015, 9:27 AM

## 2015-02-28 NOTE — Evaluation (Signed)
Occupational Therapy Evaluation Patient Details Name: Jill [name] MRN: [id number] DOB: 01-Apr-1929 Today's Date: 02/28/2015    History of Present Illness Patient with a femoal neck fracture and dementia now s/p R himiarthroplasty on 02/25/15 after a fall on wet floors at home.   Clinical Impression   Pt currently with severe dementia and new hip hemiarthroplasty.  Will benefit from continued trial OT at SNF as pt was able to transfer to the toilet, with cueing prior to fall as well as participating in her selfcare tasks, and self feeding.  Currently, she requires total assist for all ADLs and self feeding at this time.  Will defer continued OT treatment to [facility]     Follow Up Recommendations  SNF    Equipment Recommendations  None recommended by OT       Precautions / Restrictions Precautions Precautions: Posterior Hip;Fall Precaution Booklet Issued: No Required Braces or Orthoses: Knee Immobilizer - Right Knee Immobilizer - Right: On at all times Restrictions Weight Bearing Restrictions: No RLE Weight Bearing: Weight bearing as tolerated      Mobility Bed Mobility Overal bed mobility: Needs Assistance       Supine to sit: Total assist        Transfers Overall transfer level: Needs assistance   Transfers: Sit to/from Stand Sit to Stand: Total assist              Balance     Sitting balance-Leahy Scale: Poor       Standing balance-Leahy Scale: Zero                              ADL Overall ADL's : Needs assistance/impaired Eating/Feeding: Total assistance Eating/Feeding Details (indicate cue type and reason): simulated Grooming: Sitting;Total assistance                   Toilet Transfer: +2 for physical assistance;Total assistance;BSC   Toileting- Clothing Manipulation and Hygiene: +2 for physical assistance;Total assistance;Sit to/from stand         General ADL Comments: Pt currently sitting on EOB with NT  assisting.  Therapist took over session with pt sitting with close supervision.  Unable to answer any of the therapist's questions, and at times attempts to lay down, withhout awareness that she is sitting too far down toward the foot of the bed.  She was unable to utilize the RW for standing or stepping up toward the top of the bed.  Therapist provided total assist for scooting and for transition to supine position.  Pt's daughter came in at end of session and stated that the pt stayed with her and that she was ambulating around the house without assistance.  She stated that the pt needed cueing to attempt toileting tasks as well.  Recommend continued OT trial  at SNF at discharge.               Pertinent Vitals/Pain Pain Assessment: Faces Faces Pain Scale: Hurts even more Pain Location: unable to determine Pain Intervention(s): Repositioned        Extremity/Trunk Assessment Upper Extremity Assessment Upper Extremity Assessment: Difficult to assess due to impaired cognition   Lower Extremity Assessment Lower Extremity Assessment: Defer to PT evaluation   Cervical / Trunk Assessment Cervical / Trunk Assessment: Kyphotic      Cognition Arousal/Alertness: Awake/alert Behavior During Therapy: Flat affect Overall Cognitive Status:  (Pt unable to follow any one step commands.)  OT Problem List: Decreased strength;Decreased activity tolerance;Impaired balance (sitting and/or standing);Pain;Decreased cognition;Decreased knowledge of use of DME or AE;Decreased knowledge of precautions                       End of Session    Activity Tolerance: Other (comment) (eval limited secondary to cognition) Patient left: with family/visitor present;in bed   Time: [id number] OT Time Calculation (min): 17 min Charges:  OT General Charges $OT Visit: 1 Procedure OT Evaluation $Initial OT Evaluation Tier I: 1 Procedure OT  Treatments $Self Care/Home Management : 8-22 mins  Jill [name] OTR/L 02/28/2015, 3:36 PM

## 2015-02-28 NOTE — Progress Notes (Signed)
Speech Language Pathology Treatment: Dysphagia  Patient Details Name: Jill [name] MRN: [id number] DOB: 1929-06-19 Today's Date: 02/28/2015 Time: 0254-8628 SLP Time Calculation (min) (ACUTE ONLY): 30 min  Assessment / Plan / Recommendation Clinical Impression  Pt seen with am meal, initially asleep but awoke after repositioning and moderate cues. Pt able to participate in hand over hand feeding to improve awareness of POs. Mastication slow, but careful and functional. Pt consumed about 25% of meal and 100% of juice. Discussed tolerance and precautions with dtr who arrived after meal complete. Recommend pt continue dys 2/thin diet during admission. Dtr can upgrade back to regular solids as pts function improves. No further skilled intervention needed.    HPI Other Pertinent Information: 81 year old woman with history of dementia admitted after mechanical fall. Per chart daughter denies LOC. Xray revealed an impacted femoral neck fracture on the right and underwent right hip arthoplasty. CXR Lungs clear. No pleural effusion or pneumothorax. BSE performed 07/28/14 with mild oral dysphagia and no s/s aspiration and regular/thin recommended. RN reported pocketing and difficulty taking pills. Daughter present and states pt eats regular texture at home upright at table. She is primarily nonverbal and daughter denies recent pna.    Pertinent Vitals Pain Assessment: Faces Faces Pain Scale: Hurts a little bit Pain Descriptors / Indicators: Grimacing  SLP Plan  All goals met    Recommendations Diet recommendations: Dysphagia 2 (fine chop);Thin liquid Liquids provided via: Cup;No straw Medication Administration: Crushed with puree Supervision: Staff to assist with self feeding Compensations: Slow rate;Small sips/bites;Check for pocketing Postural Changes and/or Swallow Maneuvers: Seated upright 90 degrees              Oral Care Recommendations: Oral care BID Plan: All goals met    GO     Herbie Baltimore, MA CCC-SLP [phone]  Lynann Beaver 02/28/2015, 9:11 AM

## 2015-02-28 NOTE — Clinical Social Work Placement (Signed)
   CLINICAL SOCIAL WORK PLACEMENT  NOTE  Date:  02/28/2015  Patient Details  Name: Jill [name] MRN: [id number] Date of Birth: 01-Jun-1929  Clinical Social Work is seeking post-discharge placement for this patient at the Skilled  Nursing Facility level of care (*CSW will initial, date and re-position this form in  chart as items are completed):  Yes   Patient/family provided with [place] Clinical Social Work Department's list of facilities offering this level of care within the geographic area requested by the patient (or if unable, by the patient's family).  Yes   Patient/family informed of their freedom to choose among providers that offer the needed level of care, that participate in Medicare, Medicaid or managed care program needed by the patient, have an available bed and are willing to accept the patient.  Yes   Patient/family informed of [facility]'s ownership interest in [facility] and [facility], as well as of the fact that they are under no obligation to receive care at these facilities.  PASRR submitted to EDS on 02/28/15     PASRR number received on 02/28/15     Existing PASRR number confirmed on  (n/a)     FL2 transmitted to all facilities in geographic area requested by pt/family on 02/27/15     FL2 transmitted to all facilities within larger geographic area on  (n/a)     Patient informed that his/her managed care company has contracts with or will negotiate with certain facilities, including the following:   (yes, [place])     Yes   Patient/family informed of bed offers received.  [facility]     Physician recommends and patient chooses bed at  (n/a)    [facility]  Patient to be transferred to facility by [facility]     Patient family notified on 02/28/15 of transfer.  Name of family member notified:  Patient's daughter updated at bedside.     PHYSICIAN       Additional Comment:     _______________________________________________ Rod MaeVaughn, Ammarie Matsuura S, LCSW 02/28/2015, 12:41 PM [phone]

## 2015-02-28 NOTE — Progress Notes (Signed)
  PROGRESS NOTE MEDICINE TEACHING ATTENDING   Day 3 of stay Patient name: Jill [name]   Medical record number: [id number] Date of birth: 12/18/1928   No new complaints per daughter. Awake and alert today, mumbling words.   Blood pressure 155/70, pulse 63, temperature 98.1 F (36.7 C), temperature source Axillary, resp. rate 16, height 5\' 2"  (1.575 m), weight 101 lb (45.813 kg), SpO2 96 %. The patient is alert and oriented, comfortable, in no acute distress. PERRL, EOMI. Heart exhibits regular rate and rhythm, no murmurs. Lungs are clear to auscultation. Abdomen is soft and non-tender. There is no pedal edema and good pedal pulses. There are no gross focal neurological deficits apparent.   Assessment/Plan S/p R hip arthroplasty - Recovering well. Surgical site dry. Cleared for discharge from Ortho. Alert and mumbling today appears back to baseline per daughter. Stable from ortho standpoint.  Swallowing concern - Continue Dysphagia 2 diet.   PT recommendations SNF.  I have discussed the care of this patient with my IM team residents. Please see the resident note for details.  Gitel Beste 02/28/2015, 1:54 PM.

## 2015-02-28 NOTE — Discharge Summary (Signed)
Name: Jill [name] MRN: [id number] DOB: [birth year] 81 y.o. PCP: No Pcp Per Patient  Date of Admission: 02/25/2015 11:25 AM Date of Discharge: 02/28/2015 Attending Physician: Aletta Edouard, MD  Discharge Diagnosis: Principal Problem:   Fracture of femoral neck, right Active Problems:   Dementia, multi-infarct   Diastolic dysfunction   Hyperlipidemia   Protein-calorie malnutrition, severe   Fall   Postop check   UTI (urinary tract infection)  Discharge Medications:   Medication List    TAKE these medications        acetaminophen 500 MG tablet  Commonly known as:  TYLENOL  Take 1 tablet (500 mg total) by mouth every 6 (six) hours as needed for moderate pain.     aspirin EC 325 MG tablet  Take 1 tablet (325 mg total) by mouth daily.     atorvastatin 20 MG tablet  Commonly known as:  LIPITOR  Take 1 tablet (20 mg total) by mouth daily at 6 PM.     calcium carbonate 1250 (500 CA) MG tablet  Commonly known as:  OS-CAL - dosed in mg of elemental calcium  Take 1 tablet (500 mg of elemental calcium total) by mouth daily with breakfast.     Cholecalciferol 1000 UNITS tablet  Take 1 tablet (1,000 Units total) by mouth daily.     ciprofloxacin 500 MG tablet  Commonly known as:  CIPRO  Take 1 tablet (500 mg total) by mouth 2 (two) times daily. End after: March 02, 2015     diazepam 2 MG tablet  Commonly known as:  VALIUM  Take 1 tablet (2 mg total) by mouth every 8 (eight) hours as needed for anxiety.     docusate sodium 100 MG capsule  Commonly known as:  COLACE  Take 1 capsule (100 mg total) by mouth 2 (two) times daily. Hold if patient has more than 2 loose bowel movements in 24 hours.     feeding supplement (ENSURE ENLIVE) Liqd  Take 237 mLs by mouth 2 (two) times daily between meals.     polyethylene glycol packet  Commonly known as:  MIRALAX / GLYCOLAX  Take 17 g by mouth daily as needed for mild constipation.     traZODone 50 MG tablet  Commonly known as:   DESYREL  Take 2 tablets (100 mg total) by mouth at bedtime as needed for sleep.        Disposition and follow-up:   Ms.Jill [name] was discharged from [facility] in Stable condition.  At the hospital follow up visit please address:  1.  Right Femoral Neck Fracture: bipolar hemiarthroplasty 02/25/2015, follow up with orthopedic surgery (Dr. Ophelia Charter).  Urinary tract infection: Discharged with ciprofloxacin 500mg  every 12 hours for 3 days of antibiotics (end date: March 02, 2015). We will forward sensitivity results to facility when they are available.  2.  Labs / imaging needed at time of follow-up: CBC  3.  Pending labs/ test needing follow-up: Urine culture  Follow-up Appointments:     Follow-up Information    Schedule an appointment as soon as possible for a visit with Eldred Manges, MD.   Specialty:  Orthopedic Surgery   Why:  needs return office visit 2 weeks postop   Contact information:   [address] [place] [address] [phone]       Discharge Instructions: Discharge Instructions    Call MD for:  difficulty breathing, headache or visual disturbances    Complete by:  As directed  Call MD for:  persistant dizziness or light-headedness    Complete by:  As directed      Call MD for:  persistant nausea and vomiting    Complete by:  As directed      Call MD for:  redness, tenderness, or signs of infection (pain, swelling, redness, odor or green/yellow discharge around incision site)    Complete by:  As directed      Call MD for:  severe uncontrolled pain    Complete by:  As directed      Call MD for:  temperature >100.4    Complete by:  As directed      Diet - low sodium heart healthy    Complete by:  As directed            Consultations: Treatment Team:  Eldred Manges, MD  Procedures Performed:  Dg Chest Portable 1 View  02/25/2015   CLINICAL DATA:  Larey Seat this morning, hip fracture, history Alzheimer's  EXAM: PORTABLE CHEST - 1  VIEW  COMPARISON:  Portable exam 1332 hours without priors for comparison  FINDINGS: Normal heart size, mediastinal contours and pulmonary vascularity.  At tract calcification aorta.  Lungs clear.  No pleural effusion or pneumothorax.  Bones diffusely demineralized.  IMPRESSION: No acute abnormalities.   Electronically Signed   By: Ulyses Southward M.D.   On: 02/25/2015 13:47   Dg Hip Port Unilat With Pelvis 1v Right  02/25/2015   CLINICAL DATA:  Status post right hip arthroplasty.  EXAM: RIGHT HIP (WITH PELVIS) 1 VIEW PORTABLE  COMPARISON:  Preoperative exam earlier this day.  FINDINGS: Right hip arthroplasty, the femoral stem is midline. Single cerclage wire about the intertrochanteric region. Recent postsurgical change includes air in the soft tissues and lateral skin staples.  IMPRESSION: Right hip arthroplasty without immediate postoperative complication.   Electronically Signed   By: Rubye Oaks M.D.   On: 02/25/2015 22:18   Dg Hip Unilat  With Pelvis 2-3 Views Right  02/25/2015   CLINICAL DATA:  Fall.  Pain.  EXAM: RIGHT HIP (WITH PELVIS) 2-3 VIEWS  COMPARISON:  None.  FINDINGS: Mildly impacted acute femoral neck fracture on the right. No fracture of the bones of the pelvis. Intertrochanteric region appears intact.  IMPRESSION: Slightly impacted femoral neck fracture on the right.   Electronically Signed   By: Paulina Fusi M.D.   On: 02/25/2015 12:41    Admission HPI: Ms. Jill [name] is an 81 year old woman with history of dementia presenting after mechanical fall. She was in the bathtub this morning and slipped getting out of the tub. Her daughter was in a different room but came into the bathroom shortly after. She denied that her mother had LOC. She reports her mother's mental status is stable. Denies facial droop. Her health was otherwise normal prior to this. Patient unable to give history due to dementia.  Hospital Course by problem list:   1. Right Femoral Neck Fracture: CXR without  acute abnormalities. R Hip XR with slightly impacted femoral neck fracture on R. Ortho consulted by ED. She proceeded to the operating room for bipolar hemiarthroplasty 02/25/2015. The surgery was uncomplicated and the patient admitted to the general surgical floor for routine post-operative care. Vit D level found to be 23.9, and she was placed on calcium carbonate  BID and Vitamin D 1000u daily. Her post-surgical course was uneventful, and the patient ready for discharge on post-operative day 3. At the time of discharge, her pain was  controlled with PO medications with instructions for outpatient follow up with orthopedic surgery (Dr. Ophelia CharterYates).  2. Severe protein-calorie malnutrition: Her pre-albumin was found to be 13.4 and albumin was 2.8. She was seen by nutritional management and placed on Ensure Enlive (each supplement provides 350kcal and 20 grams of protein) BID. She was evaluated by Speech Pathology evaluated her as she was noted to be pocketing food by nursing and recommended:  Diet recommendations: Dysphagia 2 (fine chop); Thin liquid Liquids provided via: Cup; No straw Medication Administration: Crushed with puree Supervision: Staff to assist with self feeding Compensations: Slow rate; Small sips/bites; Check for pocketing Postural Changes and/or Swallow Maneuvers: Seated upright 90 degrees  3. Urinary tract infection: Patient with urine culture growing >100,000 E. Coli. She had urinary retention and incontinence postoperatively. She will be treated with ciprofloxacin 500mg  every 12 hours for 3 days of antibiotics. We will forward sensitivity results to facility when they are available.  4. Dementia: Continue home valium 2mg  q8hr prn anxiety and trazodone 100mg  QHS prn sleep  Discharge Vitals:   BP 155/70 mmHg  Pulse 63  Temp(Src) 98.1 F (36.7 C) (Axillary)  Resp 16  Ht 5\' 2"  (1.575 m)  Wt 101 lb (45.813 kg)  BMI 18.47 kg/m2  SpO2 96%  Discharge Labs:  Results for orders  placed or performed during the hospital encounter of 02/25/15 (from the past 24 hour(s))  CBC     Status: Abnormal   Collection Time: 02/28/15  4:06 AM  Result Value Ref Range   WBC 6.8 4.0 - 10.5 K/uL   RBC 3.00 (L) 3.87 - 5.11 MIL/uL   Hemoglobin 8.7 (L) 12.0 - 15.0 g/dL   HCT 16.127.4 (L) 09.636.0 - 04.546.0 %   MCV 91.3 78.0 - 100.0 fL   MCH 29.0 26.0 - 34.0 pg   MCHC 31.8 30.0 - 36.0 g/dL   RDW 40.912.8 81.111.5 - 91.415.5 %   Platelets 224 150 - 400 K/uL    Signed: Lora PaulaJennifer T Sephira Zellman, MD 02/28/2015, 9:39 AM    Services Ordered on Discharge: none Equipment Ordered on Discharge: none

## 2015-02-28 NOTE — Progress Notes (Signed)
[facility] Pt was unable to void. Will continue to monitor.

## 2015-02-28 NOTE — Progress Notes (Signed)
Physical Therapy Treatment Patient Details Name: Jill [name] MRN: [id number] DOB: 09/05/1928 Today's Date: 02/28/2015    History of Present Illness Patient with a femoal neck fracture and dementia now s/p R himiarthroplasty on 02/25/15 after a fall on wet floors at home.    PT Comments    Able to transfer patient from bed to chair. Patient able to provide some assistance with sitting balance but primarily max-total assistance with mobility. Continue to recommend SNF upon D/C. Patient daughter present for session and discussed D/C plan with her. She was agreeing to SNF.   Follow Up Recommendations  SNF     Equipment Recommendations  None recommended by PT    Recommendations for Other Services       Precautions / Restrictions Precautions Precautions: Posterior Hip;Fall Precaution Booklet Issued: No Required Braces or Orthoses: Knee Immobilizer - Right Knee Immobilizer - Right: On at all times Restrictions Weight Bearing Restrictions: Yes RLE Weight Bearing: Weight bearing as tolerated    Mobility  Bed Mobility Overal bed mobility: Needs Assistance Bed Mobility: Supine to Sit     Supine to sit: Max assist        Transfers Overall transfer level: Needs assistance Equipment used: Rolling walker (2 wheeled) Transfers: Stand Pivot Transfers Sit to Stand: Max assist Stand pivot transfers: Max assist       General transfer comment: stand pivot turn to chair  Ambulation/Gait                 Stairs            Wheelchair Mobility    Modified Rankin (Stroke Patients Only)       Balance Overall balance assessment: Needs assistance Sitting-balance support: Bilateral upper extremity supported;Feet supported Sitting balance-Leahy Scale: Poor Sitting balance - Comments: verbal cues to assist with sitting, patient able to engage trunk musculature for partial assit with sitting balance                            Cognition  Arousal/Alertness: Awake/alert Behavior During Therapy: Flat affect Overall Cognitive Status: History of cognitive impairments - at baseline (daughter present for session confirms baseline)                      Exercises      General Comments        Pertinent Vitals/Pain Pain Assessment:  (Unable to express, monitored during session) Faces Pain Scale: Hurts a little bit Pain Descriptors / Indicators: Grimacing Pain Intervention(s): Monitored during session;Repositioned    Home Living                      Prior Function            PT Goals (current goals can now be found in the care plan section) Acute Rehab PT Goals Patient Stated Goal: Unable to express    Frequency  Min 3X/week    PT Plan Current plan remains appropriate    Co-evaluation             End of Session Equipment Utilized During Treatment: Gait belt;Right knee immobilizer Activity Tolerance: Patient limited by pain (observable pain with transfer, resting comfortable after) Patient left: in chair;with call bell/phone within reach;with chair alarm set;with family/visitor present     Time: 4098-1191 PT Time Calculation (min) (ACUTE ONLY): 17 min  Charges:  $Therapeutic Activity: 8-22 mins  G Codes:      Christiane HaBenjamin J. Torryn Hudspeth, PT, CSCS Pager [phone] Office [phone]  02/28/2015, 11:20 AM

## 2015-02-28 NOTE — Progress Notes (Signed)
Subjective: Resting comfortably   Objective: Vital signs in last 24 hours: Temp:  [97.7 F (36.5 C)-98.5 F (36.9 C)] 98.1 F (36.7 C) (06/30 0609) Pulse Rate:  [62-79] 63 (06/30 0609) Resp:  [16] 16 (06/29 1449) BP: (133-155)/(55-70) 155/70 mmHg (06/30 0609) SpO2:  [96 %-100 %] 96 % (06/30 0609) Weight:  [45.813 kg (101 lb)] 45.813 kg (101 lb) (06/29 1255)  Intake/Output from previous day: 06/29 0701 - 06/30 0700 In: 150 [P.O.:150] Out: 1150 [Urine:1150] Intake/Output this shift:     Recent Labs  02/25/15 1347 02/25/15 1509 02/26/15 0754 02/27/15 0428 02/28/15 0406  HGB 12.6 13.6 10.3* 9.3* 8.7*    Recent Labs  02/27/15 0428 02/28/15 0406  WBC 6.5 6.8  RBC 3.19* 3.00*  HCT 29.3* 27.4*  PLT 210 224    Recent Labs  02/26/15 0754 02/27/15 0428  NA 134* 136  K 4.1 4.0  CL 100* 97*  CO2 31 32  BUN 12 12  CREATININE 0.88 0.89  GLUCOSE 135* 127*  CALCIUM 8.3* 9.0    Recent Labs  02/26/15 0754  INR 1.11    Exam:  Sleeping.  Hip wound looks good. Staples intact.  No drainage or signs of infection.    Assessment/Plan: Stable from ortho standpoint.  D/c planning per medicine team.    Naida SleightWENS,JAMES M 02/28/2015, 8:13 AM

## 2015-02-28 NOTE — Discharge Planning (Signed)
[facility]. Patient's daughter updated at bedside.  [facility] RN report number: [phone] [address]: [address]  Marcelline Deistmily Shikira Folino, [place] - [phone] Clinical Social Work Department Orthopedics [phone]([id number]) and Surgical [phone]([id number])

## 2015-03-21 ENCOUNTER — Other Ambulatory Visit: Payer: Self-pay | Admitting: Surgery

## 2016-12-13 ENCOUNTER — Emergency Department (HOSPITAL_COMMUNITY): Payer: Medicare Other

## 2016-12-13 ENCOUNTER — Inpatient Hospital Stay (HOSPITAL_COMMUNITY)
Admission: EM | Admit: 2016-12-13 | Discharge: 2016-12-17 | DRG: 469 | Disposition: A | Discharge status: Skilled Nursing Facility | Payer: Medicare Other | Attending: Internal Medicine | Admitting: Internal Medicine

## 2016-12-13 ENCOUNTER — Encounter (HOSPITAL_COMMUNITY): Payer: Self-pay

## 2016-12-13 DIAGNOSIS — E43 Unspecified severe protein-calorie malnutrition: Secondary | ICD-10-CM | POA: Diagnosis present

## 2016-12-13 DIAGNOSIS — Y92230 Patient room in hospital as the place of occurrence of the external cause: Secondary | ICD-10-CM | POA: Diagnosis not present

## 2016-12-13 DIAGNOSIS — M9701XA Periprosthetic fracture around internal prosthetic right hip joint, initial encounter: Secondary | ICD-10-CM | POA: Diagnosis not present

## 2016-12-13 DIAGNOSIS — Z79899 Other long term (current) drug therapy: Secondary | ICD-10-CM

## 2016-12-13 DIAGNOSIS — R001 Bradycardia, unspecified: Secondary | ICD-10-CM | POA: Diagnosis not present

## 2016-12-13 DIAGNOSIS — Z7982 Long term (current) use of aspirin: Secondary | ICD-10-CM

## 2016-12-13 DIAGNOSIS — Y92129 Unspecified place in nursing home as the place of occurrence of the external cause: Secondary | ICD-10-CM

## 2016-12-13 DIAGNOSIS — R030 Elevated blood-pressure reading, without diagnosis of hypertension: Secondary | ICD-10-CM | POA: Diagnosis present

## 2016-12-13 DIAGNOSIS — L7632 Postprocedural hematoma of skin and subcutaneous tissue following other procedure: Secondary | ICD-10-CM | POA: Diagnosis not present

## 2016-12-13 DIAGNOSIS — G309 Alzheimer's disease, unspecified: Secondary | ICD-10-CM | POA: Diagnosis present

## 2016-12-13 DIAGNOSIS — F0280 Dementia in other diseases classified elsewhere without behavioral disturbance: Secondary | ICD-10-CM | POA: Diagnosis present

## 2016-12-13 DIAGNOSIS — Z880 Allergy status to penicillin: Secondary | ICD-10-CM

## 2016-12-13 DIAGNOSIS — F0150 Vascular dementia without behavioral disturbance: Secondary | ICD-10-CM | POA: Diagnosis present

## 2016-12-13 DIAGNOSIS — S0083XA Contusion of other part of head, initial encounter: Secondary | ICD-10-CM | POA: Diagnosis present

## 2016-12-13 DIAGNOSIS — I739 Peripheral vascular disease, unspecified: Secondary | ICD-10-CM | POA: Diagnosis present

## 2016-12-13 DIAGNOSIS — Z419 Encounter for procedure for purposes other than remedying health state, unspecified: Secondary | ICD-10-CM

## 2016-12-13 DIAGNOSIS — Z66 Do not resuscitate: Secondary | ICD-10-CM | POA: Diagnosis present

## 2016-12-13 DIAGNOSIS — M978XXA Periprosthetic fracture around other internal prosthetic joint, initial encounter: Secondary | ICD-10-CM | POA: Diagnosis present

## 2016-12-13 DIAGNOSIS — Z96641 Presence of right artificial hip joint: Secondary | ICD-10-CM | POA: Diagnosis present

## 2016-12-13 DIAGNOSIS — Z96649 Presence of unspecified artificial hip joint: Secondary | ICD-10-CM | POA: Diagnosis present

## 2016-12-13 DIAGNOSIS — Z48812 Encounter for surgical aftercare following surgery on the circulatory system: Secondary | ICD-10-CM

## 2016-12-13 DIAGNOSIS — W19XXXA Unspecified fall, initial encounter: Secondary | ICD-10-CM | POA: Diagnosis present

## 2016-12-13 DIAGNOSIS — S72001A Fracture of unspecified part of neck of right femur, initial encounter for closed fracture: Secondary | ICD-10-CM

## 2016-12-13 DIAGNOSIS — E785 Hyperlipidemia, unspecified: Secondary | ICD-10-CM | POA: Diagnosis present

## 2016-12-13 DIAGNOSIS — N390 Urinary tract infection, site not specified: Secondary | ICD-10-CM | POA: Diagnosis present

## 2016-12-13 DIAGNOSIS — D62 Acute posthemorrhagic anemia: Secondary | ICD-10-CM | POA: Diagnosis not present

## 2016-12-13 DIAGNOSIS — M79604 Pain in right leg: Secondary | ICD-10-CM

## 2016-12-13 DIAGNOSIS — T402X5A Adverse effect of other opioids, initial encounter: Secondary | ICD-10-CM | POA: Diagnosis not present

## 2016-12-13 DIAGNOSIS — I69311 Memory deficit following cerebral infarction: Secondary | ICD-10-CM

## 2016-12-13 DIAGNOSIS — B961 Klebsiella pneumoniae [K. pneumoniae] as the cause of diseases classified elsewhere: Secondary | ICD-10-CM | POA: Diagnosis present

## 2016-12-13 HISTORY — DX: Depression, unspecified: F32.A

## 2016-12-13 HISTORY — DX: Gastro-esophageal reflux disease without esophagitis: K21.9

## 2016-12-13 HISTORY — DX: Vascular dementia, unspecified severity, without behavioral disturbance, psychotic disturbance, mood disturbance, and anxiety: F01.50

## 2016-12-13 HISTORY — DX: Acute myocardial infarction, unspecified: I21.9

## 2016-12-13 HISTORY — DX: Cerebral infarction, unspecified: I63.9

## 2016-12-13 HISTORY — DX: Personal history of other medical treatment: Z92.89

## 2016-12-13 HISTORY — DX: Anxiety disorder, unspecified: F41.9

## 2016-12-13 HISTORY — DX: Major depressive disorder, single episode, unspecified: F32.9

## 2016-12-13 HISTORY — DX: Anemia, unspecified: D64.9

## 2016-12-13 LAB — COMPREHENSIVE METABOLIC PANEL
ALT: 12 U/L — ABNORMAL LOW (ref 14–54)
AST: 17 U/L (ref 15–41)
Albumin: 3.4 g/dL — ABNORMAL LOW (ref 3.5–5.0)
Alkaline Phosphatase: 85 U/L (ref 38–126)
Anion gap: 8 (ref 5–15)
BUN: 17 mg/dL (ref 6–20)
CALCIUM: 9 mg/dL (ref 8.9–10.3)
CHLORIDE: 100 mmol/L — AB (ref 101–111)
CO2: 27 mmol/L (ref 22–32)
Creatinine, Ser: 0.81 mg/dL (ref 0.44–1.00)
GFR calc Af Amer: >60 mL/min (ref >60)
GFR calc non Af Amer: >60 mL/min (ref >60)
Glucose, Bld: 118 mg/dL — ABNORMAL HIGH (ref 65–99)
POTASSIUM: 4 mmol/L (ref 3.5–5.1)
Sodium: 135 mmol/L (ref 135–145)
TOTAL PROTEIN: 6.2 g/dL — AB (ref 6.5–8.1)
Total Bilirubin: 0.4 mg/dL (ref 0.3–1.2)

## 2016-12-13 LAB — CBC WITH DIFFERENTIAL/PLATELET
Basophils Absolute: 0 10*3/uL (ref 0.0–0.1)
Basophils Relative: 0 %
EOS ABS: 0 10*3/uL (ref 0.0–0.7)
Eosinophils Relative: 0 %
HCT: 31.1 % — ABNORMAL LOW (ref 36.0–46.0)
Hemoglobin: 10.3 g/dL — ABNORMAL LOW (ref 12.0–15.0)
LYMPHS PCT: 6 %
Lymphs Abs: 0.7 10*3/uL (ref 0.7–4.0)
MCH: 30.3 pg (ref 26.0–34.0)
MCHC: 33.1 g/dL (ref 30.0–36.0)
MCV: 91.5 fL (ref 78.0–100.0)
MONO ABS: 0.7 10*3/uL (ref 0.1–1.0)
Monocytes Relative: 6 %
Neutro Abs: 10.4 10*3/uL — ABNORMAL HIGH (ref 1.7–7.7)
Neutrophils Relative %: 88 %
Platelets: 318 10*3/uL (ref 150–400)
RBC: 3.4 MIL/uL — ABNORMAL LOW (ref 3.87–5.11)
RDW: 12.9 % (ref 11.5–15.5)
WBC: 11.8 10*3/uL — ABNORMAL HIGH (ref 4.0–10.5)

## 2016-12-13 LAB — URINALYSIS, ROUTINE W REFLEX MICROSCOPIC
Bilirubin Urine: NEGATIVE
GLUCOSE, UA: NEGATIVE mg/dL
Hgb urine dipstick: NEGATIVE
KETONES UR: NEGATIVE mg/dL
Leukocytes, UA: NEGATIVE
Nitrite: POSITIVE — AB
PH: 7 (ref 5.0–8.0)
Protein, ur: NEGATIVE mg/dL
SQUAMOUS EPITHELIAL / LPF: NONE SEEN
Specific Gravity, Urine: 1.013 (ref 1.005–1.030)

## 2016-12-13 LAB — RAPID URINE DRUG SCREEN, HOSP PERFORMED
AMPHETAMINES: POSITIVE — AB
BARBITURATES: NOT DETECTED
BENZODIAZEPINES: NOT DETECTED
COCAINE: NOT DETECTED
Opiates: NOT DETECTED
Tetrahydrocannabinol: NOT DETECTED

## 2016-12-13 LAB — I-STAT TROPONIN, ED: TROPONIN I, POC: 0 ng/mL (ref 0.00–0.08)

## 2016-12-13 MED ORDER — ACETAMINOPHEN 500 MG PO TABS
500.0000 mg | ORAL_TABLET | Freq: Once | ORAL | Status: AC
  Administered 2016-12-13: 500 mg via ORAL
  Filled 2016-12-13: qty 1

## 2016-12-13 NOTE — ED Notes (Signed)
Called Daughter Sandy Salaam [phone] with update.  Pt has been to xray and is now in CT.

## 2016-12-13 NOTE — ED Notes (Signed)
Attempted to ambulate pt.  Assisted pt up to side of bed, then to stand, pt will not put pressure on right leg and is moaning, pt then put back to bed and PA made aware.

## 2016-12-13 NOTE — ED Triage Notes (Signed)
To room via EMS.  Pt noted to have fall at [facility] this morning while walking.  [facility] patient of [facility] and hospice was notified.  Xray done after pt started showing signs of right hip pain.  Xray shows acute proximal right femur fx.  Family spoke with hospice and wants pt transferred to [facility] for surgery.  Pt has alzheimers, walks, doesn't talk, eats normal food, able to feed self.  Efraim Kaufmann- nurse [phone] would like to be updated.

## 2016-12-13 NOTE — ED Provider Notes (Signed)
[facility]-EMER[facility]Y DEPT Provider Note   CSN: [id number] Arrival date & time: 12/13/16  1626     History   Chief Complaint Chief Complaint  Patient presents with  . Fall  . Hip Injury    HPI Jill [name] is a 83 y.o. female.  Level V caveat due to dementia  HPI   Jill See ([place]) is a 83 y.o. female, with a history of dementia, CVA, TIA, and right femur fracture, presenting to the ED with injuries from an unwitnessed fall that apparently occurred this morning. Patient arrives with a packet of papers which includes an x-ray of the bilateral hips read at 1pm today indicating right proximal oblique femur fracture.  Nonverbal at baseline. Walks unassisted. Larey Seat this morning in her SNF in [place]. [facility], [address]  Jerolyn Center, Agent and emergency contact, [phone]    Past Medical History:  Diagnosis Date  . Dementia     Patient Active Problem List   Diagnosis Date Noted  . UTI (urinary tract infection) 02/28/2015  . Protein-calorie malnutrition, severe (HCC) 02/27/2015  . Fall   . Postop check   . Fracture of femoral neck, right (HCC) 02/25/2015  . Diastolic dysfunction 02/25/2015  . Hyperlipidemia 02/25/2015  . Dementia, multi-infarct   . TIA (transient ischemic attack) 07/27/2014    Past Surgical History:  Procedure Laterality Date  . HIP ARTHROPLASTY Right 02/25/2015   Procedure: ARTHROPLASTY BIPOLAR HIP (HEMIARTHROPLASTY);  Surgeon: Eldred Manges, MD;  Location: [facility] OR;  Service: Orthopedics;  Laterality: Right;    OB History    No data available       Home Medications    Prior to Admission medications   Medication Sig Start Date End Date Taking? Authorizing Provider  aspirin EC 81 MG tablet Take 81 mg by mouth daily.   Yes Historical Provider, MD  clonazePAM (KLONOPIN) 0.5 MG tablet Take 0.5 mg by mouth 2 (two) times daily as needed for anxiety.   Yes Historical Provider, MD  polyethylene glycol (MIRALAX / GLYCOLAX)  packet Take 17 g by mouth at bedtime.   Yes Historical Provider, MD  ranitidine (ZANTAC) 150 MG tablet Take 150 mg by mouth 2 (two) times daily.   Yes Historical Provider, MD  traZODone (DESYREL) 50 MG tablet Take 2 tablets (100 mg total) by mouth at bedtime as needed for sleep. Patient taking differently: Take 50 mg by mouth 3 (three) times daily.  07/30/14  Yes Ardith Dark, MD    Family History History reviewed. No pertinent family history.  Social History Social History  Substance Use Topics  . Smoking status: Never Smoker  . Smokeless tobacco: Never Used  . Alcohol use Not on file     Allergies   Penicillins   Review of Systems Review of Systems  Unable to perform ROS: Dementia     Physical Exam Updated Vital Signs BP (!) 166/64   Pulse 74   Temp 97.5 F (36.4 C) (Oral)   Resp 16   SpO2 100%   Physical Exam  Constitutional: She appears well-developed and well-nourished. No distress.  HENT:  Head: Normocephalic.  Mouth/Throat: Oropharynx is clear and moist.  Minor bruising over the right mandible and right maxilla. No noted instability, deformity, or swelling to facial bones. Dentition appears to be intact.   Eyes: Conjunctivae are normal. Pupils are equal, round, and reactive to light.  Neck: Neck supple.  Cardiovascular: Normal rate, regular rhythm, normal heart sounds and intact distal pulses.  Pulmonary/Chest: Effort normal and breath sounds normal. No respiratory distress.  Abdominal: Soft. There is no tenderness. There is no guarding.  Musculoskeletal: She exhibits edema.  Swelling noted to lateral right hip and femur. Patient's spine without noted deformity, swelling, or stepoff.   Lymphadenopathy:    She has no cervical adenopathy.  Neurological: She is alert.  Skin: Skin is warm and dry. She is not diaphoretic.  Psychiatric: She has a normal mood and affect. Her behavior is normal.  Nursing note and vitals reviewed.    ED Treatments /  Results  Labs (all labs ordered are listed, but only abnormal results are displayed) Labs Reviewed  COMPREHENSIVE METABOLIC PANEL - Abnormal; Notable for the following:       Result Value   Chloride 100 (*)    Glucose, Bld 118 (*)    Total Protein 6.2 (*)    Albumin 3.4 (*)    ALT 12 (*)    All other components within normal limits  URINALYSIS, ROUTINE W REFLEX MICROSCOPIC - Abnormal; Notable for the following:    APPearance HAZY (*)    Nitrite POSITIVE (*)    Bacteria, UA RARE (*)    All other components within normal limits  RAPID URINE DRUG SCREEN, HOSP PERFORMED - Abnormal; Notable for the following:    Amphetamines POSITIVE (*)    All other components within normal limits  CBC WITH DIFFERENTIAL/PLATELET - Abnormal; Notable for the following:    WBC 11.8 (*)    RBC 3.40 (*)    Hemoglobin 10.3 (*)    HCT 31.1 (*)    Neutro Abs 10.4 (*)    All other components within normal limits  URINE CULTURE  I-STAT TROPOININ, ED   Hemoglobin  Date Value Ref Range Status  12/13/2016 10.3 (L) 12.0 - 15.0 g/dL Final  16/06/9603 8.7 (L) 12.0 - 15.0 g/dL Final  54/05/8118 9.3 (L) 12.0 - 15.0 g/dL Final  14/78/2956 21.3 (L) 12.0 - 15.0 g/dL Final    Comment:    REPEATED TO VERIFY    EKG  EKG Interpretation  Date/Time:  Sunday December 13 2016 18:15:13 EDT Ventricular Rate:  65 PR Interval:    QRS Duration: 101 QT Interval:  397 QTC Calculation: 413 R Axis:   16 Text Interpretation:  Sinus rhythm Borderline short PR interval Low voltage, precordial leads Borderline T abnormalities, diffuse leads similar to prior EKG although technically difficult due to wandering baseline  Confirmed by LIU MD, DANA [phone]) on 12/13/2016 6:20:40 PM       Radiology Dg Chest 1 View  Result Date: 12/13/2016 CLINICAL DATA:  Recent fall EXAM: CHEST 1 VIEW COMPARISON:  02/25/2015 FINDINGS: Cardiac shadow is mildly prominent but accentuated by the frontal technique. Aortic calcifications are again seen.  The lungs are well aerated bilaterally. Large skin fold is noted laterally on the right. No pneumothorax is seen. No focal infiltrate is noted. No acute rib abnormality is seen. Chronic changes in the right eighth rib are seen posteriorly. T9 compression deformity is noted which is new from the prior exam but of uncertain chronicity. IMPRESSION: T9 compression deformity of uncertain chronicity. Chronic changes without other acute abnormality. Electronically Signed   By: Alcide Clever M.D.   On: 12/13/2016 19:41   Dg Pelvis 1-2 Views  Result Date: 12/13/2016 CLINICAL DATA:  Recent fall with pelvic pain, initial encounter EXAM: PELVIS - 1-2 VIEW COMPARISON:  None. FINDINGS: The pelvic ring is intact. Right hip prosthesis is noted. No soft tissue  abnormality is seen. IMPRESSION: No acute abnormality noted. Electronically Signed   By: Alcide Clever M.D.   On: 12/13/2016 19:45   Ct Head Wo Contrast  Result Date: 12/13/2016 CLINICAL DATA:  Fall.  Confusion. EXAM: CT HEAD WITHOUT CONTRAST CT MAXILLOFACIAL WITHOUT CONTRAST CT CERVICAL SPINE WITHOUT CONTRAST TECHNIQUE: Multidetector CT imaging of the head, cervical spine, and maxillofacial structures were performed using the standard protocol without intravenous contrast. Multiplanar CT image reconstructions of the cervical spine and maxillofacial structures were also generated. COMPARISON:  07/27/2014 head CT. FINDINGS: CT HEAD FINDINGS Brain: No evidence of parenchymal hemorrhage or extra-axial fluid collection. No mass lesion, mass effect, or midline shift. No CT evidence of acute infarction. Generalized cerebral volume loss. Nonspecific moderate subcortical and periventricular white matter hypodensity, most in keeping with chronic small vessel ischemic change. No ventriculomegaly. Vascular: Intracranial atherosclerosis.  No acute abnormality. Skull: No evidence of calvarial fracture. Sinuses/Orbits: No fluid levels. Mucoperiosteal thickening in the bilateral  ethmoidal air cells and bilateral maxillary sinuses. Other:  The mastoid air cells are unopacified. CT MAXILLOFACIAL FINDINGS Motion degraded scan. Osseous: No fracture or mandibular dislocation. No destructive process. Orbits: Negative. No traumatic or inflammatory finding. Sinuses: No fluid levels. Mucoperiosteal thickening in the bilateral ethmoidal air cells and bilateral maxillary sinuses. Soft tissues: Asymmetric right cheek contusion. Otherwise no acute abnormality. CT CERVICAL SPINE FINDINGS Motion degraded scan. Alignment: Straightening of the cervical spine. No subluxation. Dens is well positioned between the lateral masses of C1. Skull base and vertebrae: No acute fracture. No primary bone lesion or focal pathologic process. Soft tissues and spinal canal: No prevertebral fluid or swelling. No visible canal hematoma. Disc levels: Mild degenerative disc disease throughout the cervical spine. Moderate facet arthropathy bilaterally in the cervical spine . No significant degenerative foraminal stenosis. Upper chest: Negative. Other: Visualized mastoid air cells appear clear. No discrete thyroid nodules. No pathologically enlarged cervical nodes. IMPRESSION: 1. No evidence of acute intracranial abnormality. No evidence of calvarial fracture . 2. Moderate chronic small vessel ischemia and generalized cerebral volume loss. 3. Mild right cheek contusion.  No maxillofacial fracture. 4. Chronic paranasal sinusitis. 5. No cervical spine fracture or subluxation. 6. Mild-to-moderate degenerative changes in the cervical spine as detailed . Electronically Signed   By: Delbert Phenix M.D.   On: 12/13/2016 19:56   Ct Cervical Spine Wo Contrast  Result Date: 12/13/2016 CLINICAL DATA:  Fall.  Confusion. EXAM: CT HEAD WITHOUT CONTRAST CT MAXILLOFACIAL WITHOUT CONTRAST CT CERVICAL SPINE WITHOUT CONTRAST TECHNIQUE: Multidetector CT imaging of the head, cervical spine, and maxillofacial structures were performed using the  standard protocol without intravenous contrast. Multiplanar CT image reconstructions of the cervical spine and maxillofacial structures were also generated. COMPARISON:  07/27/2014 head CT. FINDINGS: CT HEAD FINDINGS Brain: No evidence of parenchymal hemorrhage or extra-axial fluid collection. No mass lesion, mass effect, or midline shift. No CT evidence of acute infarction. Generalized cerebral volume loss. Nonspecific moderate subcortical and periventricular white matter hypodensity, most in keeping with chronic small vessel ischemic change. No ventriculomegaly. Vascular: Intracranial atherosclerosis.  No acute abnormality. Skull: No evidence of calvarial fracture. Sinuses/Orbits: No fluid levels. Mucoperiosteal thickening in the bilateral ethmoidal air cells and bilateral maxillary sinuses. Other:  The mastoid air cells are unopacified. CT MAXILLOFACIAL FINDINGS Motion degraded scan. Osseous: No fracture or mandibular dislocation. No destructive process. Orbits: Negative. No traumatic or inflammatory finding. Sinuses: No fluid levels. Mucoperiosteal thickening in the bilateral ethmoidal air cells and bilateral maxillary sinuses. Soft tissues: Asymmetric right cheek contusion.  Otherwise no acute abnormality. CT CERVICAL SPINE FINDINGS Motion degraded scan. Alignment: Straightening of the cervical spine. No subluxation. Dens is well positioned between the lateral masses of C1. Skull base and vertebrae: No acute fracture. No primary bone lesion or focal pathologic process. Soft tissues and spinal canal: No prevertebral fluid or swelling. No visible canal hematoma. Disc levels: Mild degenerative disc disease throughout the cervical spine. Moderate facet arthropathy bilaterally in the cervical spine . No significant degenerative foraminal stenosis. Upper chest: Negative. Other: Visualized mastoid air cells appear clear. No discrete thyroid nodules. No pathologically enlarged cervical nodes. IMPRESSION: 1. No evidence  of acute intracranial abnormality. No evidence of calvarial fracture . 2. Moderate chronic small vessel ischemia and generalized cerebral volume loss. 3. Mild right cheek contusion.  No maxillofacial fracture. 4. Chronic paranasal sinusitis. 5. No cervical spine fracture or subluxation. 6. Mild-to-moderate degenerative changes in the cervical spine as detailed . Electronically Signed   By: Delbert Phenix M.D.   On: 12/13/2016 19:56   Ct Hip Right Wo Contrast  Result Date: 12/13/2016 CLINICAL DATA:  Right hip pain after a fall. Unable to bear weight. Plain radiograph shows acute proximal right femoral fracture. Dementia. EXAM: CT OF THE RIGHT HIP WITHOUT CONTRAST TECHNIQUE: Multidetector CT imaging of the right hip was performed according to the standard protocol. Multiplanar CT image reconstructions were also generated. COMPARISON:  Pelvic and right femoral radiographs 12/14/2006 FINDINGS: Bones/Joint/Cartilage Postoperative changes with right hip hemiarthroplasty. Non cemented femoral component. There is an acute appearing. Prosthetic fracture extending from the distal tip of the proxy cysts proximally. Cerclage wires surround the inter trochanteric region with nondisplaced fracture of the greater trochanter, possibly acute. No significant displacement of fractures. No dislocation of the hip joint. Ligaments Suboptimally assessed by CT. Muscles and Tendons Mild fullness in loss of fat planes in the anterior compartment musculature of the right thigh likely representing intramuscular hematoma. Soft tissues Vascular calcifications are present. Small lipoma demonstrated adjacent to the superior acetabulum laterally. Calcifications in the aorta and iliac arteries. Visualized pelvic bowel loops are not distended. No mass demonstrated in the pelvis although visualization is limited by streak artifact from the right hip arthroplasty. IMPRESSION: Right hip hemiarthroplasty with periprosthetic nondisplaced acute  fractures. Probable associated intramuscular hematoma in the anterior compartment. Electronically Signed   By: Burman Nieves M.D.   On: 12/13/2016 23:49   Ct Femur Right Wo Contrast  Result Date: 12/13/2016 CLINICAL DATA:  Right hip pain after a fall. Unable to bear weight. Plain radiograph shows acute proximal right femoral fracture. Dementia. EXAM: CT OF THE RIGHT HIP WITHOUT CONTRAST TECHNIQUE: Multidetector CT imaging of the right hip was performed according to the standard protocol. Multiplanar CT image reconstructions were also generated. COMPARISON:  Pelvic and right femoral radiographs 12/14/2006 FINDINGS: Bones/Joint/Cartilage Postoperative changes with right hip hemiarthroplasty. Non cemented femoral component. There is an acute appearing. Prosthetic fracture extending from the distal tip of the proxy cysts proximally. Cerclage wires surround the inter trochanteric region with nondisplaced fracture of the greater trochanter, possibly acute. No significant displacement of fractures. No dislocation of the hip joint. Ligaments Suboptimally assessed by CT. Muscles and Tendons Mild fullness in loss of fat planes in the anterior compartment musculature of the right thigh likely representing intramuscular hematoma. Soft tissues Vascular calcifications are present. Small lipoma demonstrated adjacent to the superior acetabulum laterally. Calcifications in the aorta and iliac arteries. Visualized pelvic bowel loops are not distended. No mass demonstrated in the pelvis although visualization is limited  by streak artifact from the right hip arthroplasty. IMPRESSION: Right hip hemiarthroplasty with periprosthetic nondisplaced acute fractures. Probable associated intramuscular hematoma in the anterior compartment. Electronically Signed   By: Burman Nieves M.D.   On: 12/13/2016 23:49   Dg Femur Min 2 Views Left  Result Date: 12/13/2016 CLINICAL DATA:  Recent fall with left leg pain, initial encounter EXAM:  LEFT FEMUR 2 VIEWS COMPARISON:  None FINDINGS: No acute fracture or dislocation is noted. Diffuse vascular calcifications are seen. The visualized pelvis is within normal limits. No soft tissue abnormality is noted. IMPRESSION: No acute abnormality seen. Electronically Signed   By: Alcide Clever M.D.   On: 12/13/2016 19:44   Dg Femur Min 2 Views Right  Result Date: 12/13/2016 CLINICAL DATA:  Recent fall with right leg pain, initial encounter EXAM: RIGHT FEMUR 2 VIEWS COMPARISON:  None. FINDINGS: Right hip prosthesis is noted. It appears well seated within the acetabulum. No acute fracture or dislocation is noted. Diffuse vascular calcifications are seen. The visualized pelvis is within normal limits. IMPRESSION: No acute abnormality noted. Electronically Signed   By: Alcide Clever M.D.   On: 12/13/2016 19:41   Ct Maxillofacial Wo Contrast  Result Date: 12/13/2016 CLINICAL DATA:  Fall.  Confusion. EXAM: CT HEAD WITHOUT CONTRAST CT MAXILLOFACIAL WITHOUT CONTRAST CT CERVICAL SPINE WITHOUT CONTRAST TECHNIQUE: Multidetector CT imaging of the head, cervical spine, and maxillofacial structures were performed using the standard protocol without intravenous contrast. Multiplanar CT image reconstructions of the cervical spine and maxillofacial structures were also generated. COMPARISON:  07/27/2014 head CT. FINDINGS: CT HEAD FINDINGS Brain: No evidence of parenchymal hemorrhage or extra-axial fluid collection. No mass lesion, mass effect, or midline shift. No CT evidence of acute infarction. Generalized cerebral volume loss. Nonspecific moderate subcortical and periventricular white matter hypodensity, most in keeping with chronic small vessel ischemic change. No ventriculomegaly. Vascular: Intracranial atherosclerosis.  No acute abnormality. Skull: No evidence of calvarial fracture. Sinuses/Orbits: No fluid levels. Mucoperiosteal thickening in the bilateral ethmoidal air cells and bilateral maxillary sinuses. Other:   The mastoid air cells are unopacified. CT MAXILLOFACIAL FINDINGS Motion degraded scan. Osseous: No fracture or mandibular dislocation. No destructive process. Orbits: Negative. No traumatic or inflammatory finding. Sinuses: No fluid levels. Mucoperiosteal thickening in the bilateral ethmoidal air cells and bilateral maxillary sinuses. Soft tissues: Asymmetric right cheek contusion. Otherwise no acute abnormality. CT CERVICAL SPINE FINDINGS Motion degraded scan. Alignment: Straightening of the cervical spine. No subluxation. Dens is well positioned between the lateral masses of C1. Skull base and vertebrae: No acute fracture. No primary bone lesion or focal pathologic process. Soft tissues and spinal canal: No prevertebral fluid or swelling. No visible canal hematoma. Disc levels: Mild degenerative disc disease throughout the cervical spine. Moderate facet arthropathy bilaterally in the cervical spine . No significant degenerative foraminal stenosis. Upper chest: Negative. Other: Visualized mastoid air cells appear clear. No discrete thyroid nodules. No pathologically enlarged cervical nodes. IMPRESSION: 1. No evidence of acute intracranial abnormality. No evidence of calvarial fracture . 2. Moderate chronic small vessel ischemia and generalized cerebral volume loss. 3. Mild right cheek contusion.  No maxillofacial fracture. 4. Chronic paranasal sinusitis. 5. No cervical spine fracture or subluxation. 6. Mild-to-moderate degenerative changes in the cervical spine as detailed . Electronically Signed   By: Delbert Phenix M.D.   On: 12/13/2016 19:56    Procedures Procedures (including critical care time)  Medications Ordered in ED Medications  acetaminophen (TYLENOL) tablet 500 mg (500 mg Oral Given 12/13/16 2044)  Initial Impression / Assessment and Plan / ED Course  I have reviewed the triage vital signs and the nursing notes.  Pertinent labs & imaging results that were available during my care of the  patient were reviewed by me and considered in my medical decision making (see chart for details).  Clinical Course as of Dec 15 123  Sun Dec 13, 2016  1815 Spoke with Creola Corn [facility] Supervisor. States patient walks without assistance or devices. Found on the floor in another patient's room. Primarily cared for by family at home, but was in the SNF for a 5 day respite stay. States nurses got permission from hospice to do xrays. Family and hospice discussed results of femur fracture. Family decided that they wanted the patient sent to [facility] for surgery. Patient was acting to her normal level upon transfer from the SNF.  [SJ]  75 Spoke with patient's daughter, Eber Jones, daugther and [place]. States patient is under hospice care for dementia. Wanted her transferred to [facility] for surgery on her fracture because she had her fracture in 2016 repaired here. Previous surgery performed by Annell Greening, had right side hemiarthroplasty.  [SJ]  Mon Dec 14, 2016  4098 Spoke with Dr. Magnus Ivan, orthopedic surgeon on call for [facility]. Will consult on her in the morning. Patient should be nonweightbearing. May or may not need surgery. NPO after midnight.  [SJ]  0117 Spoke with Dr. Toniann Fail, hospitalist, who agreed to admit the patient. Dr. Eliberto Ivory instructions were passed on to Dr. Toniann Fail.  [SJ]  0123 Patient's daughter was updated, as she requested.  [SJ]    Clinical Course User Index [SJ] Anselm Pancoast, PA-C    She presents following a fall with reported femur fracture on x-ray. Patient also required overall assessment and imaging due to unknown reason for unwitnessed fall.   Patient's x-rays here in the ED showed no sign of fracture. Ambulation was attempted, however, patient refused to bear weight on the right leg. Evidence of fracture on CT. Admission for ortho evaluation in the AM.  Findings and plan of care discussed with Crista Curb, MD. Dr. Verdie Mosher personally  evaluated and examined this patient.  Vitals:   12/13/16 2100 12/13/16 2200 12/13/16 2317 12/14/16 0000  BP: (!) 165/91 (!) 158/80 (!) 155/72 (!) 152/62  Pulse:   60 (!) 50  Resp: Temp:      TempSrc:      SpO2: 98% 98% 99% 99%     Final Clinical Impressions(s) / ED Diagnoses   Final diagnoses:  Right leg pain  Closed fracture of right hip, initial encounter [facility])    New Prescriptions New Prescriptions   No medications on file     Anselm Pancoast, PA-C 12/14/16 0125    Lavera Guise, MD 12/14/16 [phone]

## 2016-12-14 ENCOUNTER — Encounter (HOSPITAL_COMMUNITY): Payer: Self-pay | Admitting: Internal Medicine

## 2016-12-14 ENCOUNTER — Encounter (HOSPITAL_COMMUNITY)
Admission: EM | Disposition: A | Discharge status: Skilled Nursing Facility | Payer: Self-pay | Source: Home / Self Care | Attending: Nephrology

## 2016-12-14 ENCOUNTER — Inpatient Hospital Stay (HOSPITAL_COMMUNITY): Payer: Medicare Other

## 2016-12-14 ENCOUNTER — Inpatient Hospital Stay (HOSPITAL_COMMUNITY): Payer: Medicare Other | Admitting: Certified Registered Nurse Anesthetist

## 2016-12-14 DIAGNOSIS — R030 Elevated blood-pressure reading, without diagnosis of hypertension: Secondary | ICD-10-CM | POA: Diagnosis present

## 2016-12-14 DIAGNOSIS — F0150 Vascular dementia without behavioral disturbance: Secondary | ICD-10-CM | POA: Diagnosis present

## 2016-12-14 DIAGNOSIS — M9701XA Periprosthetic fracture around internal prosthetic right hip joint, initial encounter: Secondary | ICD-10-CM | POA: Diagnosis present

## 2016-12-14 DIAGNOSIS — Z96651 Presence of right artificial knee joint: Secondary | ICD-10-CM

## 2016-12-14 DIAGNOSIS — I69311 Memory deficit following cerebral infarction: Secondary | ICD-10-CM | POA: Diagnosis not present

## 2016-12-14 DIAGNOSIS — D62 Acute posthemorrhagic anemia: Secondary | ICD-10-CM | POA: Diagnosis not present

## 2016-12-14 DIAGNOSIS — Y92230 Patient room in hospital as the place of occurrence of the external cause: Secondary | ICD-10-CM | POA: Diagnosis not present

## 2016-12-14 DIAGNOSIS — L7632 Postprocedural hematoma of skin and subcutaneous tissue following other procedure: Secondary | ICD-10-CM | POA: Diagnosis not present

## 2016-12-14 DIAGNOSIS — M12561 Traumatic arthropathy, right knee: Secondary | ICD-10-CM | POA: Diagnosis not present

## 2016-12-14 DIAGNOSIS — S72009A Fracture of unspecified part of neck of unspecified femur, initial encounter for closed fracture: Secondary | ICD-10-CM | POA: Insufficient documentation

## 2016-12-14 DIAGNOSIS — E43 Unspecified severe protein-calorie malnutrition: Secondary | ICD-10-CM | POA: Diagnosis present

## 2016-12-14 DIAGNOSIS — Z7982 Long term (current) use of aspirin: Secondary | ICD-10-CM | POA: Diagnosis not present

## 2016-12-14 DIAGNOSIS — W19XXXA Unspecified fall, initial encounter: Secondary | ICD-10-CM | POA: Diagnosis present

## 2016-12-14 DIAGNOSIS — Z880 Allergy status to penicillin: Secondary | ICD-10-CM | POA: Diagnosis not present

## 2016-12-14 DIAGNOSIS — S0083XA Contusion of other part of head, initial encounter: Secondary | ICD-10-CM | POA: Diagnosis present

## 2016-12-14 DIAGNOSIS — R001 Bradycardia, unspecified: Secondary | ICD-10-CM | POA: Diagnosis not present

## 2016-12-14 DIAGNOSIS — S72001A Fracture of unspecified part of neck of right femur, initial encounter for closed fracture: Secondary | ICD-10-CM | POA: Diagnosis not present

## 2016-12-14 DIAGNOSIS — F0280 Dementia in other diseases classified elsewhere without behavioral disturbance: Secondary | ICD-10-CM | POA: Diagnosis present

## 2016-12-14 DIAGNOSIS — N390 Urinary tract infection, site not specified: Secondary | ICD-10-CM | POA: Diagnosis present

## 2016-12-14 DIAGNOSIS — M978XXA Periprosthetic fracture around other internal prosthetic joint, initial encounter: Secondary | ICD-10-CM | POA: Diagnosis present

## 2016-12-14 DIAGNOSIS — B961 Klebsiella pneumoniae [K. pneumoniae] as the cause of diseases classified elsewhere: Secondary | ICD-10-CM | POA: Diagnosis present

## 2016-12-14 DIAGNOSIS — Z96641 Presence of right artificial hip joint: Secondary | ICD-10-CM | POA: Diagnosis present

## 2016-12-14 DIAGNOSIS — Y92129 Unspecified place in nursing home as the place of occurrence of the external cause: Secondary | ICD-10-CM | POA: Diagnosis not present

## 2016-12-14 DIAGNOSIS — T402X5A Adverse effect of other opioids, initial encounter: Secondary | ICD-10-CM | POA: Diagnosis not present

## 2016-12-14 DIAGNOSIS — E785 Hyperlipidemia, unspecified: Secondary | ICD-10-CM | POA: Diagnosis present

## 2016-12-14 DIAGNOSIS — Z96649 Presence of unspecified artificial hip joint: Secondary | ICD-10-CM

## 2016-12-14 DIAGNOSIS — I739 Peripheral vascular disease, unspecified: Secondary | ICD-10-CM | POA: Diagnosis present

## 2016-12-14 DIAGNOSIS — G309 Alzheimer's disease, unspecified: Secondary | ICD-10-CM | POA: Diagnosis present

## 2016-12-14 DIAGNOSIS — Z66 Do not resuscitate: Secondary | ICD-10-CM | POA: Diagnosis present

## 2016-12-14 DIAGNOSIS — Z79899 Other long term (current) drug therapy: Secondary | ICD-10-CM | POA: Diagnosis not present

## 2016-12-14 HISTORY — PX: TOTAL HIP REVISION: SHX763

## 2016-12-14 LAB — COMPREHENSIVE METABOLIC PANEL
ALBUMIN: 3.3 g/dL — AB (ref 3.5–5.0)
ALK PHOS: 74 U/L (ref 38–126)
ALT: 12 U/L — ABNORMAL LOW (ref 14–54)
ANION GAP: 7 (ref 5–15)
AST: 18 U/L (ref 15–41)
BUN: 13 mg/dL (ref 6–20)
CO2: 30 mmol/L (ref 22–32)
Calcium: 9 mg/dL (ref 8.9–10.3)
Chloride: 101 mmol/L (ref 101–111)
Creatinine, Ser: 0.81 mg/dL (ref 0.44–1.00)
GFR calc non Af Amer: >60 mL/min (ref >60)
GLUCOSE: 110 mg/dL — AB (ref 65–99)
POTASSIUM: 4.2 mmol/L (ref 3.5–5.1)
SODIUM: 138 mmol/L (ref 135–145)
TOTAL PROTEIN: 6 g/dL — AB (ref 6.5–8.1)
Total Bilirubin: 0.5 mg/dL (ref 0.3–1.2)

## 2016-12-14 LAB — CBC WITH DIFFERENTIAL/PLATELET
Basophils Absolute: 0 10*3/uL (ref 0.0–0.1)
Basophils Relative: 0 %
EOS ABS: 0.1 10*3/uL (ref 0.0–0.7)
EOS PCT: 1 %
HCT: 30 % — ABNORMAL LOW (ref 36.0–46.0)
Hemoglobin: 9.7 g/dL — ABNORMAL LOW (ref 12.0–15.0)
LYMPHS ABS: 1.1 10*3/uL (ref 0.7–4.0)
Lymphocytes Relative: 16 %
MCH: 29.6 pg (ref 26.0–34.0)
MCHC: 32.3 g/dL (ref 30.0–36.0)
MCV: 91.5 fL (ref 78.0–100.0)
MONO ABS: 0.7 10*3/uL (ref 0.1–1.0)
MONOS PCT: 10 %
Neutro Abs: 4.9 10*3/uL (ref 1.7–7.7)
Neutrophils Relative %: 73 %
Platelets: 271 10*3/uL (ref 150–400)
RBC: 3.28 MIL/uL — ABNORMAL LOW (ref 3.87–5.11)
RDW: 12.8 % (ref 11.5–15.5)
WBC: 6.8 10*3/uL (ref 4.0–10.5)

## 2016-12-14 SURGERY — TOTAL HIP REVISION
Anesthesia: General | Site: Hip | Laterality: Right

## 2016-12-14 MED ORDER — KCL IN DEXTROSE-NACL 20-5-0.9 MEQ/L-%-% IV SOLN
INTRAVENOUS | Status: DC
  Administered 2016-12-14: 75 mL/h via INTRAVENOUS
  Filled 2016-12-14 (×3): qty 1000

## 2016-12-14 MED ORDER — DEXAMETHASONE SODIUM PHOSPHATE 10 MG/ML IJ SOLN
INTRAMUSCULAR | Status: AC
  Filled 2016-12-14: qty 1

## 2016-12-14 MED ORDER — ACETAMINOPHEN 650 MG RE SUPP: 650.0000 mg | Freq: Four times a day (QID) | RECTAL | Status: DC | PRN

## 2016-12-14 MED ORDER — OXYCODONE HCL 5 MG/5ML PO SOLN: 5.0000 mg | Freq: Once | ORAL | Status: DC | PRN

## 2016-12-14 MED ORDER — HYDROCODONE-ACETAMINOPHEN 5-325 MG PO TABS
1.0000 | ORAL_TABLET | Freq: Four times a day (QID) | ORAL | Status: DC | PRN
  Administered 2016-12-14 – 2016-12-17 (×4): 1 via ORAL
  Filled 2016-12-14 (×6): qty 1

## 2016-12-14 MED ORDER — DOCUSATE SODIUM 100 MG PO CAPS
100.0000 mg | ORAL_CAPSULE | Freq: Two times a day (BID) | ORAL | Status: DC
  Administered 2016-12-15 – 2016-12-16 (×4): 100 mg via ORAL
  Filled 2016-12-14 (×5): qty 1

## 2016-12-14 MED ORDER — 0.9 % SODIUM CHLORIDE (POUR BTL) OPTIME
TOPICAL | Status: DC | PRN
  Administered 2016-12-14: 1000 mL

## 2016-12-14 MED ORDER — CHLORHEXIDINE GLUCONATE 4 % EX LIQD: 60.0000 mL | Freq: Once | CUTANEOUS | Status: DC

## 2016-12-14 MED ORDER — PHENYLEPHRINE 40 MCG/ML (10ML) SYRINGE FOR IV PUSH (FOR BLOOD PRESSURE SUPPORT)
PREFILLED_SYRINGE | INTRAVENOUS | Status: DC | PRN
  Administered 2016-12-14: 80 ug via INTRAVENOUS

## 2016-12-14 MED ORDER — ONDANSETRON HCL 4 MG/2ML IJ SOLN: 4.0000 mg | Freq: Four times a day (QID) | INTRAMUSCULAR | Status: DC | PRN

## 2016-12-14 MED ORDER — ONDANSETRON HCL 4 MG/2ML IJ SOLN
INTRAMUSCULAR | Status: DC | PRN
  Administered 2016-12-14: 4 mg via INTRAVENOUS

## 2016-12-14 MED ORDER — FENTANYL CITRATE (PF) 250 MCG/5ML IJ SOLN
INTRAMUSCULAR | Status: AC
  Filled 2016-12-14: qty 5

## 2016-12-14 MED ORDER — ENSURE ENLIVE PO LIQD
237.0000 mL | Freq: Two times a day (BID) | ORAL | Status: DC
  Administered 2016-12-15 – 2016-12-17 (×5): 237 mL via ORAL

## 2016-12-14 MED ORDER — BUPIVACAINE HCL (PF) 0.25 % IJ SOLN
INTRAMUSCULAR | Status: DC | PRN
  Administered 2016-12-14: 22 mL

## 2016-12-14 MED ORDER — PHENYLEPHRINE 40 MCG/ML (10ML) SYRINGE FOR IV PUSH (FOR BLOOD PRESSURE SUPPORT)
PREFILLED_SYRINGE | INTRAVENOUS | Status: AC
  Filled 2016-12-14: qty 10

## 2016-12-14 MED ORDER — LACTATED RINGERS IV SOLN
INTRAVENOUS | Status: DC
  Administered 2016-12-14 (×3): via INTRAVENOUS

## 2016-12-14 MED ORDER — METOCLOPRAMIDE HCL 5 MG/ML IJ SOLN: 5.0000 mg | Freq: Three times a day (TID) | INTRAMUSCULAR | Status: DC | PRN

## 2016-12-14 MED ORDER — LIDOCAINE 2% (20 MG/ML) 5 ML SYRINGE
INTRAMUSCULAR | Status: DC | PRN
Start: 2016-12-14 — End: 2016-12-14
  Administered 2016-12-14: 60 mg via INTRAVENOUS

## 2016-12-14 MED ORDER — SODIUM CHLORIDE 0.9 % IR SOLN
Status: DC | PRN
  Administered 2016-12-14: 1000 mL

## 2016-12-14 MED ORDER — SUGAMMADEX SODIUM 200 MG/2ML IV SOLN
INTRAVENOUS | Status: DC | PRN
  Administered 2016-12-14: 90 mg via INTRAVENOUS

## 2016-12-14 MED ORDER — ONDANSETRON HCL 4 MG/2ML IJ SOLN
INTRAMUSCULAR | Status: AC
  Filled 2016-12-14: qty 2

## 2016-12-14 MED ORDER — DOCUSATE SODIUM 100 MG PO CAPS: 100.0000 mg | ORAL_CAPSULE | Freq: Every day | ORAL | Status: DC

## 2016-12-14 MED ORDER — MENTHOL 3 MG MT LOZG: 1.0000 | LOZENGE | OROMUCOSAL | Status: DC | PRN

## 2016-12-14 MED ORDER — SUCCINYLCHOLINE CHLORIDE 200 MG/10ML IV SOSY
PREFILLED_SYRINGE | INTRAVENOUS | Status: AC
  Filled 2016-12-14: qty 10

## 2016-12-14 MED ORDER — SUGAMMADEX SODIUM 200 MG/2ML IV SOLN
INTRAVENOUS | Status: AC
  Filled 2016-12-14: qty 2

## 2016-12-14 MED ORDER — MORPHINE SULFATE (PF) 2 MG/ML IV SOLN
0.5000 mg | INTRAVENOUS | Status: DC | PRN
  Administered 2016-12-14 – 2016-12-15 (×4): 0.5 mg via INTRAVENOUS
  Filled 2016-12-14 (×5): qty 1

## 2016-12-14 MED ORDER — BUPIVACAINE HCL (PF) 0.25 % IJ SOLN
INTRAMUSCULAR | Status: AC
  Filled 2016-12-14: qty 30

## 2016-12-14 MED ORDER — FENTANYL CITRATE (PF) 100 MCG/2ML IJ SOLN
INTRAMUSCULAR | Status: AC
  Filled 2016-12-14: qty 2

## 2016-12-14 MED ORDER — PHENOL 1.4 % MT LIQD: 1.0000 | OROMUCOSAL | Status: DC | PRN

## 2016-12-14 MED ORDER — TRANEXAMIC ACID 1000 MG/10ML IV SOLN
1000.0000 mg | INTRAVENOUS | Status: AC
  Administered 2016-12-14: 1000 mg via INTRAVENOUS
  Filled 2016-12-14: qty 10

## 2016-12-14 MED ORDER — EPHEDRINE SULFATE-NACL 50-0.9 MG/10ML-% IV SOSY
PREFILLED_SYRINGE | INTRAVENOUS | Status: DC | PRN
Start: 2016-12-14 — End: 2016-12-14
  Administered 2016-12-14 (×2): 10 mg via INTRAVENOUS

## 2016-12-14 MED ORDER — FAMOTIDINE IN NACL 20-0.9 MG/50ML-% IV SOLN
20.0000 mg | INTRAVENOUS | Status: DC
  Administered 2016-12-14 – 2016-12-15 (×2): 20 mg via INTRAVENOUS
  Filled 2016-12-14 (×2): qty 50

## 2016-12-14 MED ORDER — ROCURONIUM BROMIDE 50 MG/5ML IV SOSY
PREFILLED_SYRINGE | INTRAVENOUS | Status: AC
  Filled 2016-12-14: qty 5

## 2016-12-14 MED ORDER — EPHEDRINE 5 MG/ML INJ
INTRAVENOUS | Status: AC
  Filled 2016-12-14: qty 10

## 2016-12-14 MED ORDER — ASPIRIN EC 325 MG PO TBEC
325.0000 mg | DELAYED_RELEASE_TABLET | Freq: Every day | ORAL | Status: DC
  Administered 2016-12-15 – 2016-12-17 (×3): 325 mg via ORAL
  Filled 2016-12-14 (×3): qty 1

## 2016-12-14 MED ORDER — POVIDONE-IODINE 10 % EX SWAB: 2.0000 "application " | Freq: Once | CUTANEOUS | Status: DC

## 2016-12-14 MED ORDER — ACETAMINOPHEN 325 MG PO TABS: 650.0000 mg | ORAL_TABLET | Freq: Four times a day (QID) | ORAL | Status: DC | PRN

## 2016-12-14 MED ORDER — FENTANYL CITRATE (PF) 100 MCG/2ML IJ SOLN
INTRAMUSCULAR | Status: DC | PRN
  Administered 2016-12-14 (×2): 25 ug via INTRAVENOUS
  Administered 2016-12-14 (×2): 50 ug via INTRAVENOUS

## 2016-12-14 MED ORDER — DEXAMETHASONE SODIUM PHOSPHATE 10 MG/ML IJ SOLN
INTRAMUSCULAR | Status: DC | PRN
  Administered 2016-12-14: 5 mg via INTRAVENOUS

## 2016-12-14 MED ORDER — METOCLOPRAMIDE HCL 5 MG PO TABS: 5.0000 mg | ORAL_TABLET | Freq: Three times a day (TID) | ORAL | Status: DC | PRN

## 2016-12-14 MED ORDER — CEFAZOLIN SODIUM-DEXTROSE 2-4 GM/100ML-% IV SOLN
2.0000 g | INTRAVENOUS | Status: AC
  Administered 2016-12-14: 2 g via INTRAVENOUS
  Filled 2016-12-14: qty 100

## 2016-12-14 MED ORDER — PHENYLEPHRINE HCL 10 MG/ML IJ SOLN
INTRAMUSCULAR | Status: DC | PRN
  Administered 2016-12-14: 20 ug/min via INTRAVENOUS

## 2016-12-14 MED ORDER — ONDANSETRON HCL 4 MG PO TABS: 4.0000 mg | ORAL_TABLET | Freq: Four times a day (QID) | ORAL | Status: DC | PRN

## 2016-12-14 MED ORDER — FENTANYL CITRATE (PF) 100 MCG/2ML IJ SOLN
25.0000 ug | INTRAMUSCULAR | Status: DC | PRN
  Administered 2016-12-14: 25 ug via INTRAVENOUS

## 2016-12-14 MED ORDER — ROCURONIUM BROMIDE 100 MG/10ML IV SOLN
INTRAVENOUS | Status: DC | PRN
  Administered 2016-12-14: 10 mg via INTRAVENOUS
  Administered 2016-12-14: 30 mg via INTRAVENOUS
  Administered 2016-12-14: 20 mg via INTRAVENOUS

## 2016-12-14 MED ORDER — PROPOFOL 10 MG/ML IV BOLUS
INTRAVENOUS | Status: DC | PRN
  Administered 2016-12-14: 140 mg via INTRAVENOUS

## 2016-12-14 MED ORDER — OXYCODONE HCL 5 MG PO TABS: 5.0000 mg | ORAL_TABLET | Freq: Once | ORAL | Status: DC | PRN

## 2016-12-14 SURGICAL SUPPLY — 68 items
BENZOIN TINCTURE PRP APPL 2/3 (GAUZE/BANDAGES/DRESSINGS) ×3 IMPLANT
BLADE CLIPPER SURG (BLADE) IMPLANT
BRUSH FEMORAL CANAL (MISCELLANEOUS) IMPLANT
CABLE CERLAGE W/CRIMP 1.8 (Cable) ×8 IMPLANT
CABLE CERLAGE W/CRIMP 1.8MM (Cable) ×4 IMPLANT
COVER BACK TABLE 24X17X13 BIG (DRAPES) ×3 IMPLANT
COVER SURGICAL LIGHT HANDLE (MISCELLANEOUS) ×3 IMPLANT
DRAPE C-ARM 42X72 X-RAY (DRAPES) ×3 IMPLANT
DRAPE C-ARMOR (DRAPES) ×3 IMPLANT
DRAPE IMP U-DRAPE 54X76 (DRAPES) ×3 IMPLANT
DRAPE INCISE IOBAN 66X45 STRL (DRAPES) IMPLANT
DRAPE ORTHO SPLIT 77X108 STRL (DRAPES) ×4
DRAPE SURG ORHT 6 SPLT 77X108 (DRAPES) ×2 IMPLANT
DRAPE U-SHAPE 47X51 STRL (DRAPES) ×3 IMPLANT
DRSG AQUACEL AG ADV 3.5X14 (GAUZE/BANDAGES/DRESSINGS) ×3 IMPLANT
DRSG PAD ABDOMINAL 8X10 ST (GAUZE/BANDAGES/DRESSINGS) ×6 IMPLANT
DURAPREP 26ML APPLICATOR (WOUND CARE) ×3 IMPLANT
ELECT CAUTERY BLADE 6.4 (BLADE) ×3 IMPLANT
ELECT REM PT RETURN 9FT ADLT (ELECTROSURGICAL) ×3
ELECTRODE REM PT RTRN 9FT ADLT (ELECTROSURGICAL) ×1 IMPLANT
EVACUATOR 1/8 PVC DRAIN (DRAIN) IMPLANT
FACESHIELD WRAPAROUND (MASK) ×6 IMPLANT
GAUZE SPONGE 4X4 12PLY STRL (GAUZE/BANDAGES/DRESSINGS) ×3 IMPLANT
GAUZE XEROFORM 5X9 LF (GAUZE/BANDAGES/DRESSINGS) ×3 IMPLANT
GLOVE BIOGEL PI IND STRL 8 (GLOVE) ×2 IMPLANT
GLOVE BIOGEL PI INDICATOR 8 (GLOVE) ×4
GLOVE ORTHO TXT STRL SZ7.5 (GLOVE) ×9 IMPLANT
GOWN STRL REUS W/ TWL LRG LVL3 (GOWN DISPOSABLE) ×1 IMPLANT
GOWN STRL REUS W/ TWL XL LVL3 (GOWN DISPOSABLE) ×1 IMPLANT
GOWN STRL REUS W/TWL 2XL LVL3 (GOWN DISPOSABLE) ×3 IMPLANT
GOWN STRL REUS W/TWL LRG LVL3 (GOWN DISPOSABLE) ×2
GOWN STRL REUS W/TWL XL LVL3 (GOWN DISPOSABLE) ×2
HANDPIECE INTERPULSE COAX TIP (DISPOSABLE) ×2
IMMOBILIZER KNEE 20 (SOFTGOODS) IMPLANT
IMMOBILIZER KNEE 22 UNIV (SOFTGOODS) IMPLANT
IMMOBILIZER KNEE 24 THIGH 36 (MISCELLANEOUS) IMPLANT
IMMOBILIZER KNEE 24 UNIV (MISCELLANEOUS)
KIT BASIN OR (CUSTOM PROCEDURE TRAY) ×3 IMPLANT
KIT ROOM TURNOVER OR (KITS) ×3 IMPLANT
MANIFOLD NEPTUNE II (INSTRUMENTS) ×3 IMPLANT
NEEDLE 1/2 CIR MAYO (NEEDLE) ×3 IMPLANT
NEEDLE HYPO 25GX1X1/2 BEV (NEEDLE) ×3 IMPLANT
NS IRRIG 1000ML POUR BTL (IV SOLUTION) ×3 IMPLANT
PACK TOTAL JOINT (CUSTOM PROCEDURE TRAY) ×3 IMPLANT
PACK UNIVERSAL I (CUSTOM PROCEDURE TRAY) ×3 IMPLANT
PAD ARMBOARD 7.5X6 YLW CONV (MISCELLANEOUS) ×6 IMPLANT
REAMER ROD DEEP FLUTE 2.5X950 (INSTRUMENTS) IMPLANT
SET HNDPC FAN SPRY TIP SCT (DISPOSABLE) ×1 IMPLANT
SPONGE LAP 4X18 X RAY DECT (DISPOSABLE) ×6 IMPLANT
STAPLER VISISTAT 35W (STAPLE) ×3 IMPLANT
STEM FEMORAL TAPER SM 8X10.5 (Stem) ×3 IMPLANT
SUCTION FRAZIER HANDLE 10FR (MISCELLANEOUS) ×2
SUCTION TUBE FRAZIER 10FR DISP (MISCELLANEOUS) ×1 IMPLANT
SUT ETHIBOND NAB CT1 #1 30IN (SUTURE) ×9 IMPLANT
SUT TICRON (SUTURE) ×6 IMPLANT
SUT VIC AB 0 CT1 27 (SUTURE) ×4
SUT VIC AB 0 CT1 27XBRD ANBCTR (SUTURE) ×2 IMPLANT
SUT VIC AB 1 CT1 27 (SUTURE) ×4
SUT VIC AB 1 CT1 27XBRD ANBCTR (SUTURE) ×2 IMPLANT
SUT VIC AB 2-0 CT1 27 (SUTURE) ×6
SUT VIC AB 2-0 CT1 TAPERPNT 27 (SUTURE) ×3 IMPLANT
SUT VICRYL 0 TIES 12 18 (SUTURE) ×3 IMPLANT
SYR CONTROL 10ML LL (SYRINGE) ×3 IMPLANT
TOWEL OR 17X24 6PK STRL BLUE (TOWEL DISPOSABLE) ×3 IMPLANT
TOWEL OR 17X26 10 PK STRL BLUE (TOWEL DISPOSABLE) ×3 IMPLANT
TOWER CARTRIDGE SMART MIX (DISPOSABLE) IMPLANT
TRAY FOLEY W/METER SILVER 16FR (SET/KITS/TRAYS/PACK) ×3 IMPLANT
WATER STERILE IRR 1000ML POUR (IV SOLUTION) ×12 IMPLANT

## 2016-12-14 NOTE — Consult Note (Addendum)
Reason for Consult: Right periprosthetic femur fracture with femoral stem subsidence Referring Physician: Dr. Huey Romans is an 83 y.o. female.  HPI: 83 year old female who lives with her daughter and has hospice coverage for 5 days a month despite when she stays in the skilled facility had a fall with hip pain inability to ambulate and x-rays and CT scan shows a periprosthetic femur fracture around a press-fit monopolar hemiarthroplasty that was placed on 02/25/2015. By 6 weeks after the surgery she was ambulatory with her daughter and have been able to travel and even go to the beach. She has dementia and has minimal conversant. X-rays and CT scan shows that the prosthesis has subsided down into the canal greater than a centimeter with instability. I've talked with the daughter on the telephone who has power of attorney and she requested that we proceed with surgical stabilization so that she can continue to ambulate.  Past Medical History:  Diagnosis Date  . Dementia     Past Surgical History:  Procedure Laterality Date  . HIP ARTHROPLASTY Right 02/25/2015   Procedure: ARTHROPLASTY BIPOLAR HIP (HEMIARTHROPLASTY);  Surgeon: Marybelle Killings, MD;  Location: [facility];  Service: Orthopedics;  Laterality: Right;    Family History  Problem Relation Age of Onset  . Family history unknown: Yes    Social History:  reports that she has never smoked. She has never used smokeless tobacco. Her alcohol and drug histories are not on file.  Allergies:  Allergies  Allergen Reactions  . Penicillins Rash    Medications: I have reviewed the patient's current medications.  Results for orders placed or performed during the hospital encounter of 12/13/16 (from the past 48 hour(s))  Comprehensive metabolic panel     Status: Abnormal   Collection Time: 12/13/16  6:24 PM  Result Value Ref Range   Sodium 135 135 - 145 mmol/L   Potassium 4.0 3.5 - 5.1 mmol/L   Chloride 100 (L) 101 - 111 mmol/L     CO2 27 22 - 32 mmol/L   Glucose, Bld 118 (H) 65 - 99 mg/dL   BUN 17 6 - 20 mg/dL   Creatinine, Ser 0.81 0.44 - 1.00 mg/dL   Calcium 9.0 8.9 - 10.3 mg/dL   Total Protein 6.2 (L) 6.5 - 8.1 g/dL   Albumin 3.4 (L) 3.5 - 5.0 g/dL   AST 17 15 - 41 U/L   ALT 12 (L) 14 - 54 U/L   Alkaline Phosphatase 85 38 - 126 U/L   Total Bilirubin 0.4 0.3 - 1.2 mg/dL   GFR calc non Af Amer >60 >60 mL/min   GFR calc Af Amer >60 >60 mL/min    Comment: (NOTE) The eGFR has been calculated using the CKD EPI equation. This calculation has not been validated in all clinical situations. eGFR's persistently <60 mL/min signify possible Chronic Kidney Disease.    Anion gap 8 5 - 15  CBC with Differential     Status: Abnormal   Collection Time: 12/13/16  6:24 PM  Result Value Ref Range   WBC 11.8 (H) 4.0 - 10.5 K/uL   RBC 3.40 (L) 3.87 - 5.11 MIL/uL   Hemoglobin 10.3 (L) 12.0 - 15.0 g/dL   HCT 31.1 (L) 36.0 - 46.0 %   MCV 91.5 78.0 - 100.0 fL   MCH 30.3 26.0 - 34.0 pg   MCHC 33.1 30.0 - 36.0 g/dL   RDW 12.9 11.5 - 15.5 %   Platelets 318 150 - 400  K/uL   Neutrophils Relative % 88 %   Neutro Abs 10.4 (H) 1.7 - 7.7 K/uL   Lymphocytes Relative 6 %   Lymphs Abs 0.7 0.7 - 4.0 K/uL   Monocytes Relative 6 %   Monocytes Absolute 0.7 0.1 - 1.0 K/uL   Eosinophils Relative 0 %   Eosinophils Absolute 0.0 0.0 - 0.7 K/uL   Basophils Relative 0 %   Basophils Absolute 0.0 0.0 - 0.1 K/uL  I-stat troponin, ED     Status: None   Collection Time: 12/13/16  6:27 PM  Result Value Ref Range   Troponin i, poc 0.00 0.00 - 0.08 ng/mL   Comment 3            Comment: Due to the release kinetics of cTnI, a negative result within the first hours of the onset of symptoms does not rule out myocardial infarction with certainty. If myocardial infarction is still suspected, repeat the test at appropriate intervals.   Urinalysis, Routine w reflex microscopic     Status: Abnormal   Collection Time: 12/13/16  8:00 PM  Result  Value Ref Range   Color, Urine YELLOW YELLOW   APPearance HAZY (A) CLEAR   Specific Gravity, Urine 1.013 1.005 - 1.030   pH 7.0 5.0 - 8.0   Glucose, UA NEGATIVE NEGATIVE mg/dL   Hgb urine dipstick NEGATIVE NEGATIVE   Bilirubin Urine NEGATIVE NEGATIVE   Ketones, ur NEGATIVE NEGATIVE mg/dL   Protein, ur NEGATIVE NEGATIVE mg/dL   Nitrite POSITIVE (A) NEGATIVE   Leukocytes, UA NEGATIVE NEGATIVE   RBC / HPF 0-5 0 - 5 RBC/hpf   WBC, UA 0-5 0 - 5 WBC/hpf   Bacteria, UA RARE (A) NONE SEEN   Squamous Epithelial / LPF NONE SEEN NONE SEEN  Urine rapid drug screen (hosp performed)     Status: Abnormal   Collection Time: 12/13/16  8:00 PM  Result Value Ref Range   Opiates NONE DETECTED NONE DETECTED   Cocaine NONE DETECTED NONE DETECTED   Benzodiazepines NONE DETECTED NONE DETECTED   Amphetamines POSITIVE (A) NONE DETECTED   Tetrahydrocannabinol NONE DETECTED NONE DETECTED   Barbiturates NONE DETECTED NONE DETECTED    Comment:        DRUG SCREEN FOR MEDICAL PURPOSES ONLY.  IF CONFIRMATION IS NEEDED FOR ANY PURPOSE, NOTIFY LAB WITHIN 5 DAYS.        LOWEST DETECTABLE LIMITS FOR URINE DRUG SCREEN Drug Class       Cutoff (ng/mL) Amphetamine      1000 Barbiturate      200 Benzodiazepine   956 Tricyclics       213 Opiates          300 Cocaine          300 THC              50   Type and screen [place]     Status: None   Collection Time: 12/14/16  4:29 AM  Result Value Ref Range   ABO/RH(D) O NEG    Antibody Screen NEG    Sample Expiration 12/17/2016   CBC WITH DIFFERENTIAL     Status: Abnormal   Collection Time: 12/14/16  4:33 AM  Result Value Ref Range   WBC 6.8 4.0 - 10.5 K/uL   RBC 3.28 (L) 3.87 - 5.11 MIL/uL   Hemoglobin 9.7 (L) 12.0 - 15.0 g/dL   HCT 30.0 (L) 36.0 - 46.0 %   MCV 91.5 78.0 - 100.0  fL   MCH 29.6 26.0 - 34.0 pg   MCHC 32.3 30.0 - 36.0 g/dL   RDW 12.8 11.5 - 15.5 %   Platelets 271 150 - 400 K/uL   Neutrophils Relative % 73 %   Neutro Abs  4.9 1.7 - 7.7 K/uL   Lymphocytes Relative 16 %   Lymphs Abs 1.1 0.7 - 4.0 K/uL   Monocytes Relative 10 %   Monocytes Absolute 0.7 0.1 - 1.0 K/uL   Eosinophils Relative 1 %   Eosinophils Absolute 0.1 0.0 - 0.7 K/uL   Basophils Relative 0 %   Basophils Absolute 0.0 0.0 - 0.1 K/uL  Comprehensive metabolic panel     Status: Abnormal   Collection Time: 12/14/16  4:33 AM  Result Value Ref Range   Sodium 138 135 - 145 mmol/L   Potassium 4.2 3.5 - 5.1 mmol/L   Chloride 101 101 - 111 mmol/L   CO2 30 22 - 32 mmol/L   Glucose, Bld 110 (H) 65 - 99 mg/dL   BUN 13 6 - 20 mg/dL   Creatinine, Ser 0.81 0.44 - 1.00 mg/dL   Calcium 9.0 8.9 - 10.3 mg/dL   Total Protein 6.0 (L) 6.5 - 8.1 g/dL   Albumin 3.3 (L) 3.5 - 5.0 g/dL   AST 18 15 - 41 U/L   ALT 12 (L) 14 - 54 U/L   Alkaline Phosphatase 74 38 - 126 U/L   Total Bilirubin 0.5 0.3 - 1.2 mg/dL   GFR calc non Af Amer >60 >60 mL/min   GFR calc Af Amer >60 >60 mL/min    Comment: (NOTE) The eGFR has been calculated using the CKD EPI equation. This calculation has not been validated in all clinical situations. eGFR's persistently <60 mL/min signify possible Chronic Kidney Disease.    Anion gap 7 5 - 15    Dg Chest 1 View  Result Date: 12/13/2016 CLINICAL DATA:  Recent fall EXAM: CHEST 1 VIEW COMPARISON:  02/25/2015 FINDINGS: Cardiac shadow is mildly prominent but accentuated by the frontal technique. Aortic calcifications are again seen. The lungs are well aerated bilaterally. Large skin fold is noted laterally on the right. No pneumothorax is seen. No focal infiltrate is noted. No acute rib abnormality is seen. Chronic changes in the right eighth rib are seen posteriorly. T9 compression deformity is noted which is new from the prior exam but of uncertain chronicity. IMPRESSION: T9 compression deformity of uncertain chronicity. Chronic changes without other acute abnormality. Electronically Signed   By: Inez Catalina M.D.   On: 12/13/2016 19:41   Dg  Pelvis 1-2 Views  Result Date: 12/13/2016 CLINICAL DATA:  Recent fall with pelvic pain, initial encounter EXAM: PELVIS - 1-2 VIEW COMPARISON:  None. FINDINGS: The pelvic ring is intact. Right hip prosthesis is noted. No soft tissue abnormality is seen. IMPRESSION: No acute abnormality noted. Electronically Signed   By: Inez Catalina M.D.   On: 12/13/2016 19:45   Ct Head Wo Contrast  Result Date: 12/13/2016 CLINICAL DATA:  Fall.  Confusion. EXAM: CT HEAD WITHOUT CONTRAST CT MAXILLOFACIAL WITHOUT CONTRAST CT CERVICAL SPINE WITHOUT CONTRAST TECHNIQUE: Multidetector CT imaging of the head, cervical spine, and maxillofacial structures were performed using the standard protocol without intravenous contrast. Multiplanar CT image reconstructions of the cervical spine and maxillofacial structures were also generated. COMPARISON:  07/27/2014 head CT. FINDINGS: CT HEAD FINDINGS Brain: No evidence of parenchymal hemorrhage or extra-axial fluid collection. No mass lesion, mass effect, or midline shift. No CT evidence of  acute infarction. Generalized cerebral volume loss. Nonspecific moderate subcortical and periventricular white matter hypodensity, most in keeping with chronic small vessel ischemic change. No ventriculomegaly. Vascular: Intracranial atherosclerosis.  No acute abnormality. Skull: No evidence of calvarial fracture. Sinuses/Orbits: No fluid levels. Mucoperiosteal thickening in the bilateral ethmoidal air cells and bilateral maxillary sinuses. Other:  The mastoid air cells are unopacified. CT MAXILLOFACIAL FINDINGS Motion degraded scan. Osseous: No fracture or mandibular dislocation. No destructive process. Orbits: Negative. No traumatic or inflammatory finding. Sinuses: No fluid levels. Mucoperiosteal thickening in the bilateral ethmoidal air cells and bilateral maxillary sinuses. Soft tissues: Asymmetric right cheek contusion. Otherwise no acute abnormality. CT CERVICAL SPINE FINDINGS Motion degraded scan.  Alignment: Straightening of the cervical spine. No subluxation. Dens is well positioned between the lateral masses of C1. Skull base and vertebrae: No acute fracture. No primary bone lesion or focal pathologic process. Soft tissues and spinal canal: No prevertebral fluid or swelling. No visible canal hematoma. Disc levels: Mild degenerative disc disease throughout the cervical spine. Moderate facet arthropathy bilaterally in the cervical spine . No significant degenerative foraminal stenosis. Upper chest: Negative. Other: Visualized mastoid air cells appear clear. No discrete thyroid nodules. No pathologically enlarged cervical nodes. IMPRESSION: 1. No evidence of acute intracranial abnormality. No evidence of calvarial fracture . 2. Moderate chronic small vessel ischemia and generalized cerebral volume loss. 3. Mild right cheek contusion.  No maxillofacial fracture. 4. Chronic paranasal sinusitis. 5. No cervical spine fracture or subluxation. 6. Mild-to-moderate degenerative changes in the cervical spine as detailed . Electronically Signed   By: Ilona Sorrel M.D.   On: 12/13/2016 19:56   Ct Cervical Spine Wo Contrast  Result Date: 12/13/2016 CLINICAL DATA:  Fall.  Confusion. EXAM: CT HEAD WITHOUT CONTRAST CT MAXILLOFACIAL WITHOUT CONTRAST CT CERVICAL SPINE WITHOUT CONTRAST TECHNIQUE: Multidetector CT imaging of the head, cervical spine, and maxillofacial structures were performed using the standard protocol without intravenous contrast. Multiplanar CT image reconstructions of the cervical spine and maxillofacial structures were also generated. COMPARISON:  07/27/2014 head CT. FINDINGS: CT HEAD FINDINGS Brain: No evidence of parenchymal hemorrhage or extra-axial fluid collection. No mass lesion, mass effect, or midline shift. No CT evidence of acute infarction. Generalized cerebral volume loss. Nonspecific moderate subcortical and periventricular white matter hypodensity, most in keeping with chronic small  vessel ischemic change. No ventriculomegaly. Vascular: Intracranial atherosclerosis.  No acute abnormality. Skull: No evidence of calvarial fracture. Sinuses/Orbits: No fluid levels. Mucoperiosteal thickening in the bilateral ethmoidal air cells and bilateral maxillary sinuses. Other:  The mastoid air cells are unopacified. CT MAXILLOFACIAL FINDINGS Motion degraded scan. Osseous: No fracture or mandibular dislocation. No destructive process. Orbits: Negative. No traumatic or inflammatory finding. Sinuses: No fluid levels. Mucoperiosteal thickening in the bilateral ethmoidal air cells and bilateral maxillary sinuses. Soft tissues: Asymmetric right cheek contusion. Otherwise no acute abnormality. CT CERVICAL SPINE FINDINGS Motion degraded scan. Alignment: Straightening of the cervical spine. No subluxation. Dens is well positioned between the lateral masses of C1. Skull base and vertebrae: No acute fracture. No primary bone lesion or focal pathologic process. Soft tissues and spinal canal: No prevertebral fluid or swelling. No visible canal hematoma. Disc levels: Mild degenerative disc disease throughout the cervical spine. Moderate facet arthropathy bilaterally in the cervical spine . No significant degenerative foraminal stenosis. Upper chest: Negative. Other: Visualized mastoid air cells appear clear. No discrete thyroid nodules. No pathologically enlarged cervical nodes. IMPRESSION: 1. No evidence of acute intracranial abnormality. No evidence of calvarial fracture . 2. Moderate chronic  small vessel ischemia and generalized cerebral volume loss. 3. Mild right cheek contusion.  No maxillofacial fracture. 4. Chronic paranasal sinusitis. 5. No cervical spine fracture or subluxation. 6. Mild-to-moderate degenerative changes in the cervical spine as detailed . Electronically Signed   By: Ilona Sorrel M.D.   On: 12/13/2016 19:56   Ct Hip Right Wo Contrast  Result Date: 12/13/2016 CLINICAL DATA:  Right hip pain after  a fall. Unable to bear weight. Plain radiograph shows acute proximal right femoral fracture. Dementia. EXAM: CT OF THE RIGHT HIP WITHOUT CONTRAST TECHNIQUE: Multidetector CT imaging of the right hip was performed according to the standard protocol. Multiplanar CT image reconstructions were also generated. COMPARISON:  Pelvic and right femoral radiographs 12/14/2006 FINDINGS: Bones/Joint/Cartilage Postoperative changes with right hip hemiarthroplasty. Non cemented femoral component. There is an acute appearing. Prosthetic fracture extending from the distal tip of the proxy cysts proximally. Cerclage wires surround the inter trochanteric region with nondisplaced fracture of the greater trochanter, possibly acute. No significant displacement of fractures. No dislocation of the hip joint. Ligaments Suboptimally assessed by CT. Muscles and Tendons Mild fullness in loss of fat planes in the anterior compartment musculature of the right thigh likely representing intramuscular hematoma. Soft tissues Vascular calcifications are present. Small lipoma demonstrated adjacent to the superior acetabulum laterally. Calcifications in the aorta and iliac arteries. Visualized pelvic bowel loops are not distended. No mass demonstrated in the pelvis although visualization is limited by streak artifact from the right hip arthroplasty. IMPRESSION: Right hip hemiarthroplasty with periprosthetic nondisplaced acute fractures. Probable associated intramuscular hematoma in the anterior compartment. Electronically Signed   By: Lucienne Capers M.D.   On: 12/13/2016 23:49   Ct Femur Right Wo Contrast  Result Date: 12/13/2016 CLINICAL DATA:  Right hip pain after a fall. Unable to bear weight. Plain radiograph shows acute proximal right femoral fracture. Dementia. EXAM: CT OF THE RIGHT HIP WITHOUT CONTRAST TECHNIQUE: Multidetector CT imaging of the right hip was performed according to the standard protocol. Multiplanar CT image reconstructions  were also generated. COMPARISON:  Pelvic and right femoral radiographs 12/14/2006 FINDINGS: Bones/Joint/Cartilage Postoperative changes with right hip hemiarthroplasty. Non cemented femoral component. There is an acute appearing. Prosthetic fracture extending from the distal tip of the proxy cysts proximally. Cerclage wires surround the inter trochanteric region with nondisplaced fracture of the greater trochanter, possibly acute. No significant displacement of fractures. No dislocation of the hip joint. Ligaments Suboptimally assessed by CT. Muscles and Tendons Mild fullness in loss of fat planes in the anterior compartment musculature of the right thigh likely representing intramuscular hematoma. Soft tissues Vascular calcifications are present. Small lipoma demonstrated adjacent to the superior acetabulum laterally. Calcifications in the aorta and iliac arteries. Visualized pelvic bowel loops are not distended. No mass demonstrated in the pelvis although visualization is limited by streak artifact from the right hip arthroplasty. IMPRESSION: Right hip hemiarthroplasty with periprosthetic nondisplaced acute fractures. Probable associated intramuscular hematoma in the anterior compartment. Electronically Signed   By: Lucienne Capers M.D.   On: 12/13/2016 23:49   Dg Femur Min 2 Views Left  Result Date: 12/13/2016 CLINICAL DATA:  Recent fall with left leg pain, initial encounter EXAM: LEFT FEMUR 2 VIEWS COMPARISON:  None FINDINGS: No acute fracture or dislocation is noted. Diffuse vascular calcifications are seen. The visualized pelvis is within normal limits. No soft tissue abnormality is noted. IMPRESSION: No acute abnormality seen. Electronically Signed   By: Inez Catalina M.D.   On: 12/13/2016 19:44   Dg  Femur Min 2 Views Right  Result Date: 12/13/2016 CLINICAL DATA:  Recent fall with right leg pain, initial encounter EXAM: RIGHT FEMUR 2 VIEWS COMPARISON:  None. FINDINGS: Right hip prosthesis is noted. It  appears well seated within the acetabulum. No acute fracture or dislocation is noted. Diffuse vascular calcifications are seen. The visualized pelvis is within normal limits. IMPRESSION: No acute abnormality noted. Electronically Signed   By: Inez Catalina M.D.   On: 12/13/2016 19:41   Ct Maxillofacial Wo Contrast  Result Date: 12/13/2016 CLINICAL DATA:  Fall.  Confusion. EXAM: CT HEAD WITHOUT CONTRAST CT MAXILLOFACIAL WITHOUT CONTRAST CT CERVICAL SPINE WITHOUT CONTRAST TECHNIQUE: Multidetector CT imaging of the head, cervical spine, and maxillofacial structures were performed using the standard protocol without intravenous contrast. Multiplanar CT image reconstructions of the cervical spine and maxillofacial structures were also generated. COMPARISON:  07/27/2014 head CT. FINDINGS: CT HEAD FINDINGS Brain: No evidence of parenchymal hemorrhage or extra-axial fluid collection. No mass lesion, mass effect, or midline shift. No CT evidence of acute infarction. Generalized cerebral volume loss. Nonspecific moderate subcortical and periventricular white matter hypodensity, most in keeping with chronic small vessel ischemic change. No ventriculomegaly. Vascular: Intracranial atherosclerosis.  No acute abnormality. Skull: No evidence of calvarial fracture. Sinuses/Orbits: No fluid levels. Mucoperiosteal thickening in the bilateral ethmoidal air cells and bilateral maxillary sinuses. Other:  The mastoid air cells are unopacified. CT MAXILLOFACIAL FINDINGS Motion degraded scan. Osseous: No fracture or mandibular dislocation. No destructive process. Orbits: Negative. No traumatic or inflammatory finding. Sinuses: No fluid levels. Mucoperiosteal thickening in the bilateral ethmoidal air cells and bilateral maxillary sinuses. Soft tissues: Asymmetric right cheek contusion. Otherwise no acute abnormality. CT CERVICAL SPINE FINDINGS Motion degraded scan. Alignment: Straightening of the cervical spine. No subluxation. Dens is  well positioned between the lateral masses of C1. Skull base and vertebrae: No acute fracture. No primary bone lesion or focal pathologic process. Soft tissues and spinal canal: No prevertebral fluid or swelling. No visible canal hematoma. Disc levels: Mild degenerative disc disease throughout the cervical spine. Moderate facet arthropathy bilaterally in the cervical spine . No significant degenerative foraminal stenosis. Upper chest: Negative. Other: Visualized mastoid air cells appear clear. No discrete thyroid nodules. No pathologically enlarged cervical nodes. IMPRESSION: 1. No evidence of acute intracranial abnormality. No evidence of calvarial fracture . 2. Moderate chronic small vessel ischemia and generalized cerebral volume loss. 3. Mild right cheek contusion.  No maxillofacial fracture. 4. Chronic paranasal sinusitis. 5. No cervical spine fracture or subluxation. 6. Mild-to-moderate degenerative changes in the cervical spine as detailed . Electronically Signed   By: Ilona Sorrel M.D.   On: 12/13/2016 19:56    ROS review of systems is performed from chart review and discussion with patient's daughter. Patient has dementia and is minimally conversant with poor historian. Patient does have hypertension Blood pressure (!) 160/79, pulse 95, temperature 98.2 F (36.8 C), temperature source Oral, resp. rate 18, SpO2 90 %. Physical Exam  Constitutional: She appears well-developed and well-nourished.  Severe dementia patient only utters a few words.  HENT:  Head: Normocephalic and atraumatic.  Eyes: Conjunctivae are normal. Pupils are equal, round, and reactive to light.  Neck: Normal range of motion.  Cardiovascular: Normal rate and regular rhythm.   Respiratory: Effort normal and breath sounds normal.  GI: Soft.  Musculoskeletal:  Slight shortening right lower extremity. Distal pulses are palpable. With sensation in her foot she moves her ankle. No skin lesions. Right femur fracture is a closed  injury.  Neurological:  Dementia patient tracks with her eyes. She responds to pain with right hip range of motion.  Skin: Skin is warm and dry.  Right hip skin incision is well-healed. She is incontinent.  Psychiatric:  Alzheimer's with dementia. Patient is essentially nonconversant.    Assessment/Plan: Plan will be stabilization of the femur fracture. Possible exchange this to a longer prosthesis to get to stem diameters past the fracture site with cable fixation and possible lateral plate fixation if needed. Procedure was discussed with the daughter risks of hip dislocation reoperation, infection, heart attack, stroke and death were discussed. Daughter understands and states she would like to proceed so the patient can ambulate once again which she seems to enjoy. Questions were elicited and answered. Daughter requests we proceed. Patient has increased risk due to her age as well as severe dementia and a risk for falling, hip dislocation, renal heart failure as well as stroke.  Marybelle Killings 12/14/2016, 11:59 AM

## 2016-12-14 NOTE — Op Note (Addendum)
Preop diagnosis: Right her prosthetic femoral shaft fracture with hemiarthroplasty subsidence.  Postop diagnosis: Same  Procedure: Right hip hemiarthroplasty revision with placement of the longstem coated press-fit stem with cerclage wire fixation of femur fracture.  ( previous stem subsidence and unstable with fracture at the tip. New longer prothesis placed for intramedullary stabilization with cerclage tensioned cables to stabilize the fracture )  Surgeon: Annell Greening M.D.  Assistant: Zonia Kief PA-C medically necessary and present for the entire procedure.  Anesthesia: Gen. orotracheal plus Marcaine local.  Implants Depuy 10.5 diameter longstem with porous coating. Previous monopolar ball was used.  Procedure after induction of general anesthesia placement of Foley catheter placed placed in the lateral position with the maxillary roll Center prepping and draping after the 10:15 drape of been applied. Impervious stockinette Caban in usual total hip sheets drapes Betadine Steri-Drape 2 Ancef prophylaxis and TXA given. Steri-Drape was applied to seal the skin Ioband 2. Timeout procedure completed. Old incision was opened extending down to the mid femoral shaft. Patient was thin, retractor was placed with a short blades. Vastus lateralis was split fracture was reduced with distraction, cables were passed 3 tightened with Zonia Kief PA-C distracting fracture to anatomic position as cables were tensioned and individually  clamped. Hip was then the dislocated  After capsulotomy with protection of the sciatic nerve and was some difficulty final stem was extracted and removed. Osteotomes were used to remove bone around the proximal stem and  some bone from the lateral cortex and inside of the trochanter. Once stem was removed , sequencial straight reamers  8.5 and 9 and then the 10.5 mL stem was selected, impacted down into place under C-arm visualization which was placed underneath the table with the  sterile drapes until it was in satisfactory position exactly where it was with the previous levels 12 mm calcar. When the patient had the femur fracture the femoral prosthesis stem had rotated and slid down into the canal. Once it was reduced confirmed under x-rays femur was rotated located the AP and cluster frog-leg view cables were tightened down and a fourth cable was added distally which was at the distalmost aspect of the fracture along the medial aspect of the cortex. There are tightened and secured screw down with the screwdriver into the clamp cramping it and then the cables were cut. Fracture was anatomic. Pulsatile lavage, hemiarthroplasty ball  popped on, impacted, stem was stable hip was reduced with excellent findings of stability and good restoration of leg length. C-arm corresponded with the previous position of her hemiarthroplasty 2 years ago 2016. Repeat lavage standard by reapproximation with the 0 Vicryl vastus lateralis fascia. Tensor fascia was closed with #1 Vicryl 2-0 Vicryl subtendinous tissue skin staple closure Mimie Goering infiltration postop dressing. Instrument count needle count was correct.

## 2016-12-14 NOTE — ED Notes (Signed)
Pt found sitting up in bed

## 2016-12-14 NOTE — Anesthesia Procedure Notes (Signed)
Procedure Name: Intubation Date/Time: 12/14/2016 1:01 PM Performed by: Trixie Deis A Pre-anesthesia Checklist: Patient identified, Emergency Drugs available, Suction available and Patient being monitored Patient Re-evaluated:Patient Re-evaluated prior to inductionOxygen Delivery Method: Circle System Utilized Preoxygenation: Pre-oxygenation with 100% oxygen Intubation Type: IV induction Ventilation: Mask ventilation without difficulty Laryngoscope Size: Mac and 3 Grade View: Grade I Tube type: Oral Tube size: 7.0 mm Number of attempts: 1 Airway Equipment and Method: Stylet and Oral airway Placement Confirmation: ETT inserted through vocal cords under direct vision,  positive ETCO2 and breath sounds checked- equal and bilateral Secured at: 21 cm Tube secured with: Tape Dental Injury: Teeth and Oropharynx as per pre-operative assessment

## 2016-12-14 NOTE — Transfer of Care (Signed)
Immediate Anesthesia Transfer of Care Note  Patient: Jill [name]  Procedure(s) Performed: Procedure(s): RIGHT HIP HEMIARTHROPLASTY REVISION WITH CABLE PLACEMENT (Right)  Patient Location: PACU  Anesthesia Type:General  Level of Consciousness: awake and patient cooperative  Airway & Oxygen Therapy: Patient Spontanous Breathing and Patient connected to nasal cannula oxygen  Post-op Assessment: Report given to RN, Post -op Vital signs reviewed and stable and Patient moving all extremities  Post vital signs: Reviewed and stable  Last Vitals:  Vitals:   12/14/16 1140 12/14/16 1504  BP: (!) 160/79   Pulse: 95   Resp: 18   Temp: 36.8 C 36.5 C    Last Pain:  Vitals:   12/14/16 1140  TempSrc: Oral  PainSc:          Complications: No apparent anesthesia complications

## 2016-12-14 NOTE — Progress Notes (Signed)
PROGRESS NOTE    Jill [name]  ZOX:[id number] DOB: [birth year] DOA: 12/13/2016 PCP: No PCP Per Patient   Brief Narrative: 83 y.o. female with history of dementia and nonverbal was brought to the ER after patient had an unwitnessed fall at the skilled nursing facility. Patient is nonverbal at baseline and does not provide any history. Exact circumstances of the fall was not known.   In the ER CT scan showed right hip periprosthetic fracture. Orthopedics was consulted and admitted for possible surgery.   Assessment & Plan:  #  Right hip fracture/periprosthetic nondisplaced acute fracture:  -as per CT scan, pt may have associated intramuscular hematoma in the anterior compartment. -Asian is currently nothing by mouth for possible surgical intervention. Orthopedics was consulted. I will start IV fluid with dextrose and Pepcid. -Continue morphine for the pain management. Added Colace. -CT head with no acute finding.  #Mild sinus bradycardia: Likely contributed by morphine. Continue to monitor. Patient is not on beta blocker.  #Elevated blood pressure likely contributed by pain: Not on antihypertensive medication. Continue to monitor.  # Normocytic anemia: Given CT scan finding of intramuscular hepatoma, repeat CBC in the morning. Hemoglobin today around baseline. Follow-up surgeon's plan.  #Dementia without behavioral disturbance  UA has no WBC but nitrite is positive. Follow up culture results.  DVT prophylaxis: SCD. No anticoagulant isn't because of intramuscular hematoma Code Status: DO NOT RESUSCITATE Family Communication: No family at bedside Disposition Plan: Likely discharge to rehabilitation in 1-2 days depending on surgeon's plan    Consultants:   Orthopedics  Procedures: None Antimicrobials: None  Subjective: Patient was seen and examined at bedside. She was allowed awake but nonverbal and not oriented. Unable to obtain review of system.  Objective: Vitals:   12/13/16 2317 12/14/16 0000 12/14/16 0100 12/14/16 0338  BP: (!) 155/72 (!) 152/62 (!) 143/62 (!) 167/54  Pulse: 60 (!) 50 (!) 47 (!) 52  Resp: Temp:    98.3 F (36.8 C)  TempSrc:    Oral  SpO2: 99% 99% 96% 99%   No intake or output data in the 24 hours ending 12/14/16 1048 There were no vitals filed for this visit.  Examination:  General exam: Elderly female lying on bed comfortable, nonverbal  Respiratory system: Clear to auscultation. Respiratory effort normal. No wheezing or crackle Cardiovascular system: S1 & S2 heard, regular, bradycardic.  No pedal edema. Gastrointestinal system: Abdomen is nondistended, soft and nontender. Normal bowel sounds heard. Central nervous system: Alert and awake but nonverbal. Skin: No rashes, lesions or ulcers Psychiatry: unable to assess    Data Reviewed: I have personally reviewed following labs and imaging studies  CBC:  Recent Labs Lab 12/13/16 1824 12/14/16 0433  WBC 11.8* 6.8  NEUTROABS 10.4* 4.9  HGB 10.3* 9.7*  HCT 31.1* 30.0*  MCV 91.5 91.5  PLT 318 271   Basic Metabolic Panel:  Recent Labs Lab 12/13/16 1824 12/14/16 0433  NA 135 138  K 4.0 4.2  CL 100* 101  CO2 27 30  GLUCOSE 118* 110*  BUN 17 13  CREATININE 0.81 0.81  CALCIUM 9.0 9.0   GFR: CrCl cannot be calculated (Unknown ideal weight.). Liver Function Tests:  Recent Labs Lab 12/13/16 1824 12/14/16 0433  AST 17 18  ALT 12* 12*  ALKPHOS 85 74  BILITOT 0.4 0.5  PROT 6.2* 6.0*  ALBUMIN 3.4* 3.3*   No results for input(s): LIPASE, AMYLASE in the last 168 hours. No results for input(s): AMMONIA in  the last 168 hours. Coagulation Profile: No results for input(s): INR, PROTIME in the last 168 hours. Cardiac Enzymes: No results for input(s): CKTOTAL, CKMB, CKMBINDEX, TROPONINI in the last 168 hours. BNP (last 3 results) No results for input(s): PROBNP in the last 8760 hours. HbA1C: No results for input(s): HGBA1C in the last 72  hours. CBG: No results for input(s): GLUCAP in the last 168 hours. Lipid Profile: No results for input(s): CHOL, HDL, LDLCALC, TRIG, CHOLHDL, LDLDIRECT in the last 72 hours. Thyroid Function Tests: No results for input(s): TSH, T4TOTAL, FREET4, T3FREE, THYROIDAB in the last 72 hours. Anemia Panel: No results for input(s): VITAMINB12, FOLATE, FERRITIN, TIBC, IRON, RETICCTPCT in the last 72 hours. Sepsis Labs: No results for input(s): PROCALCITON, LATICACIDVEN in the last 168 hours.  No results found for this or any previous visit (from the past 240 hour(s)).       Radiology Studies: Dg Chest 1 View  Result Date: 12/13/2016 CLINICAL DATA:  Recent fall EXAM: CHEST 1 VIEW COMPARISON:  02/25/2015 FINDINGS: Cardiac shadow is mildly prominent but accentuated by the frontal technique. Aortic calcifications are again seen. The lungs are well aerated bilaterally. Large skin fold is noted laterally on the right. No pneumothorax is seen. No focal infiltrate is noted. No acute rib abnormality is seen. Chronic changes in the right eighth rib are seen posteriorly. T9 compression deformity is noted which is new from the prior exam but of uncertain chronicity. IMPRESSION: T9 compression deformity of uncertain chronicity. Chronic changes without other acute abnormality. Electronically Signed   By: Alcide Clever M.D.   On: 12/13/2016 19:41   Dg Pelvis 1-2 Views  Result Date: 12/13/2016 CLINICAL DATA:  Recent fall with pelvic pain, initial encounter EXAM: PELVIS - 1-2 VIEW COMPARISON:  None. FINDINGS: The pelvic ring is intact. Right hip prosthesis is noted. No soft tissue abnormality is seen. IMPRESSION: No acute abnormality noted. Electronically Signed   By: Alcide Clever M.D.   On: 12/13/2016 19:45   Ct Head Wo Contrast  Result Date: 12/13/2016 CLINICAL DATA:  Fall.  Confusion. EXAM: CT HEAD WITHOUT CONTRAST CT MAXILLOFACIAL WITHOUT CONTRAST CT CERVICAL SPINE WITHOUT CONTRAST TECHNIQUE: Multidetector CT  imaging of the head, cervical spine, and maxillofacial structures were performed using the standard protocol without intravenous contrast. Multiplanar CT image reconstructions of the cervical spine and maxillofacial structures were also generated. COMPARISON:  07/27/2014 head CT. FINDINGS: CT HEAD FINDINGS Brain: No evidence of parenchymal hemorrhage or extra-axial fluid collection. No mass lesion, mass effect, or midline shift. No CT evidence of acute infarction. Generalized cerebral volume loss. Nonspecific moderate subcortical and periventricular white matter hypodensity, most in keeping with chronic small vessel ischemic change. No ventriculomegaly. Vascular: Intracranial atherosclerosis.  No acute abnormality. Skull: No evidence of calvarial fracture. Sinuses/Orbits: No fluid levels. Mucoperiosteal thickening in the bilateral ethmoidal air cells and bilateral maxillary sinuses. Other:  The mastoid air cells are unopacified. CT MAXILLOFACIAL FINDINGS Motion degraded scan. Osseous: No fracture or mandibular dislocation. No destructive process. Orbits: Negative. No traumatic or inflammatory finding. Sinuses: No fluid levels. Mucoperiosteal thickening in the bilateral ethmoidal air cells and bilateral maxillary sinuses. Soft tissues: Asymmetric right cheek contusion. Otherwise no acute abnormality. CT CERVICAL SPINE FINDINGS Motion degraded scan. Alignment: Straightening of the cervical spine. No subluxation. Dens is well positioned between the lateral masses of C1. Skull base and vertebrae: No acute fracture. No primary bone lesion or focal pathologic process. Soft tissues and spinal canal: No prevertebral fluid or swelling. No visible canal  hematoma. Disc levels: Mild degenerative disc disease throughout the cervical spine. Moderate facet arthropathy bilaterally in the cervical spine . No significant degenerative foraminal stenosis. Upper chest: Negative. Other: Visualized mastoid air cells appear clear. No  discrete thyroid nodules. No pathologically enlarged cervical nodes. IMPRESSION: 1. No evidence of acute intracranial abnormality. No evidence of calvarial fracture . 2. Moderate chronic small vessel ischemia and generalized cerebral volume loss. 3. Mild right cheek contusion.  No maxillofacial fracture. 4. Chronic paranasal sinusitis. 5. No cervical spine fracture or subluxation. 6. Mild-to-moderate degenerative changes in the cervical spine as detailed . Electronically Signed   By: Delbert Phenix M.D.   On: 12/13/2016 19:56   Ct Cervical Spine Wo Contrast  Result Date: 12/13/2016 CLINICAL DATA:  Fall.  Confusion. EXAM: CT HEAD WITHOUT CONTRAST CT MAXILLOFACIAL WITHOUT CONTRAST CT CERVICAL SPINE WITHOUT CONTRAST TECHNIQUE: Multidetector CT imaging of the head, cervical spine, and maxillofacial structures were performed using the standard protocol without intravenous contrast. Multiplanar CT image reconstructions of the cervical spine and maxillofacial structures were also generated. COMPARISON:  07/27/2014 head CT. FINDINGS: CT HEAD FINDINGS Brain: No evidence of parenchymal hemorrhage or extra-axial fluid collection. No mass lesion, mass effect, or midline shift. No CT evidence of acute infarction. Generalized cerebral volume loss. Nonspecific moderate subcortical and periventricular white matter hypodensity, most in keeping with chronic small vessel ischemic change. No ventriculomegaly. Vascular: Intracranial atherosclerosis.  No acute abnormality. Skull: No evidence of calvarial fracture. Sinuses/Orbits: No fluid levels. Mucoperiosteal thickening in the bilateral ethmoidal air cells and bilateral maxillary sinuses. Other:  The mastoid air cells are unopacified. CT MAXILLOFACIAL FINDINGS Motion degraded scan. Osseous: No fracture or mandibular dislocation. No destructive process. Orbits: Negative. No traumatic or inflammatory finding. Sinuses: No fluid levels. Mucoperiosteal thickening in the bilateral  ethmoidal air cells and bilateral maxillary sinuses. Soft tissues: Asymmetric right cheek contusion. Otherwise no acute abnormality. CT CERVICAL SPINE FINDINGS Motion degraded scan. Alignment: Straightening of the cervical spine. No subluxation. Dens is well positioned between the lateral masses of C1. Skull base and vertebrae: No acute fracture. No primary bone lesion or focal pathologic process. Soft tissues and spinal canal: No prevertebral fluid or swelling. No visible canal hematoma. Disc levels: Mild degenerative disc disease throughout the cervical spine. Moderate facet arthropathy bilaterally in the cervical spine . No significant degenerative foraminal stenosis. Upper chest: Negative. Other: Visualized mastoid air cells appear clear. No discrete thyroid nodules. No pathologically enlarged cervical nodes. IMPRESSION: 1. No evidence of acute intracranial abnormality. No evidence of calvarial fracture . 2. Moderate chronic small vessel ischemia and generalized cerebral volume loss. 3. Mild right cheek contusion.  No maxillofacial fracture. 4. Chronic paranasal sinusitis. 5. No cervical spine fracture or subluxation. 6. Mild-to-moderate degenerative changes in the cervical spine as detailed . Electronically Signed   By: Delbert Phenix M.D.   On: 12/13/2016 19:56   Ct Hip Right Wo Contrast  Result Date: 12/13/2016 CLINICAL DATA:  Right hip pain after a fall. Unable to bear weight. Plain radiograph shows acute proximal right femoral fracture. Dementia. EXAM: CT OF THE RIGHT HIP WITHOUT CONTRAST TECHNIQUE: Multidetector CT imaging of the right hip was performed according to the standard protocol. Multiplanar CT image reconstructions were also generated. COMPARISON:  Pelvic and right femoral radiographs 12/14/2006 FINDINGS: Bones/Joint/Cartilage Postoperative changes with right hip hemiarthroplasty. Non cemented femoral component. There is an acute appearing. Prosthetic fracture extending from the distal tip of  the proxy cysts proximally. Cerclage wires surround the inter trochanteric region with nondisplaced  fracture of the greater trochanter, possibly acute. No significant displacement of fractures. No dislocation of the hip joint. Ligaments Suboptimally assessed by CT. Muscles and Tendons Mild fullness in loss of fat planes in the anterior compartment musculature of the right thigh likely representing intramuscular hematoma. Soft tissues Vascular calcifications are present. Small lipoma demonstrated adjacent to the superior acetabulum laterally. Calcifications in the aorta and iliac arteries. Visualized pelvic bowel loops are not distended. No mass demonstrated in the pelvis although visualization is limited by streak artifact from the right hip arthroplasty. IMPRESSION: Right hip hemiarthroplasty with periprosthetic nondisplaced acute fractures. Probable associated intramuscular hematoma in the anterior compartment. Electronically Signed   By: Burman Nieves M.D.   On: 12/13/2016 23:49   Ct Femur Right Wo Contrast  Result Date: 12/13/2016 CLINICAL DATA:  Right hip pain after a fall. Unable to bear weight. Plain radiograph shows acute proximal right femoral fracture. Dementia. EXAM: CT OF THE RIGHT HIP WITHOUT CONTRAST TECHNIQUE: Multidetector CT imaging of the right hip was performed according to the standard protocol. Multiplanar CT image reconstructions were also generated. COMPARISON:  Pelvic and right femoral radiographs 12/14/2006 FINDINGS: Bones/Joint/Cartilage Postoperative changes with right hip hemiarthroplasty. Non cemented femoral component. There is an acute appearing. Prosthetic fracture extending from the distal tip of the proxy cysts proximally. Cerclage wires surround the inter trochanteric region with nondisplaced fracture of the greater trochanter, possibly acute. No significant displacement of fractures. No dislocation of the hip joint. Ligaments Suboptimally assessed by CT. Muscles and Tendons  Mild fullness in loss of fat planes in the anterior compartment musculature of the right thigh likely representing intramuscular hematoma. Soft tissues Vascular calcifications are present. Small lipoma demonstrated adjacent to the superior acetabulum laterally. Calcifications in the aorta and iliac arteries. Visualized pelvic bowel loops are not distended. No mass demonstrated in the pelvis although visualization is limited by streak artifact from the right hip arthroplasty. IMPRESSION: Right hip hemiarthroplasty with periprosthetic nondisplaced acute fractures. Probable associated intramuscular hematoma in the anterior compartment. Electronically Signed   By: Burman Nieves M.D.   On: 12/13/2016 23:49   Dg Femur Min 2 Views Left  Result Date: 12/13/2016 CLINICAL DATA:  Recent fall with left leg pain, initial encounter EXAM: LEFT FEMUR 2 VIEWS COMPARISON:  None FINDINGS: No acute fracture or dislocation is noted. Diffuse vascular calcifications are seen. The visualized pelvis is within normal limits. No soft tissue abnormality is noted. IMPRESSION: No acute abnormality seen. Electronically Signed   By: Alcide Clever M.D.   On: 12/13/2016 19:44   Dg Femur Min 2 Views Right  Result Date: 12/13/2016 CLINICAL DATA:  Recent fall with right leg pain, initial encounter EXAM: RIGHT FEMUR 2 VIEWS COMPARISON:  None. FINDINGS: Right hip prosthesis is noted. It appears well seated within the acetabulum. No acute fracture or dislocation is noted. Diffuse vascular calcifications are seen. The visualized pelvis is within normal limits. IMPRESSION: No acute abnormality noted. Electronically Signed   By: Alcide Clever M.D.   On: 12/13/2016 19:41   Ct Maxillofacial Wo Contrast  Result Date: 12/13/2016 CLINICAL DATA:  Fall.  Confusion. EXAM: CT HEAD WITHOUT CONTRAST CT MAXILLOFACIAL WITHOUT CONTRAST CT CERVICAL SPINE WITHOUT CONTRAST TECHNIQUE: Multidetector CT imaging of the head, cervical spine, and maxillofacial  structures were performed using the standard protocol without intravenous contrast. Multiplanar CT image reconstructions of the cervical spine and maxillofacial structures were also generated. COMPARISON:  07/27/2014 head CT. FINDINGS: CT HEAD FINDINGS Brain: No evidence of parenchymal hemorrhage or extra-axial fluid  collection. No mass lesion, mass effect, or midline shift. No CT evidence of acute infarction. Generalized cerebral volume loss. Nonspecific moderate subcortical and periventricular white matter hypodensity, most in keeping with chronic small vessel ischemic change. No ventriculomegaly. Vascular: Intracranial atherosclerosis.  No acute abnormality. Skull: No evidence of calvarial fracture. Sinuses/Orbits: No fluid levels. Mucoperiosteal thickening in the bilateral ethmoidal air cells and bilateral maxillary sinuses. Other:  The mastoid air cells are unopacified. CT MAXILLOFACIAL FINDINGS Motion degraded scan. Osseous: No fracture or mandibular dislocation. No destructive process. Orbits: Negative. No traumatic or inflammatory finding. Sinuses: No fluid levels. Mucoperiosteal thickening in the bilateral ethmoidal air cells and bilateral maxillary sinuses. Soft tissues: Asymmetric right cheek contusion. Otherwise no acute abnormality. CT CERVICAL SPINE FINDINGS Motion degraded scan. Alignment: Straightening of the cervical spine. No subluxation. Dens is well positioned between the lateral masses of C1. Skull base and vertebrae: No acute fracture. No primary bone lesion or focal pathologic process. Soft tissues and spinal canal: No prevertebral fluid or swelling. No visible canal hematoma. Disc levels: Mild degenerative disc disease throughout the cervical spine. Moderate facet arthropathy bilaterally in the cervical spine . No significant degenerative foraminal stenosis. Upper chest: Negative. Other: Visualized mastoid air cells appear clear. No discrete thyroid nodules. No pathologically enlarged  cervical nodes. IMPRESSION: 1. No evidence of acute intracranial abnormality. No evidence of calvarial fracture . 2. Moderate chronic small vessel ischemia and generalized cerebral volume loss. 3. Mild right cheek contusion.  No maxillofacial fracture. 4. Chronic paranasal sinusitis. 5. No cervical spine fracture or subluxation. 6. Mild-to-moderate degenerative changes in the cervical spine as detailed . Electronically Signed   By: Delbert Phenix M.D.   On: 12/13/2016 19:56        Scheduled Meds: .  ceFAZolin (ANCEF) IV  2 g Intravenous On Call to OR  . chlorhexidine  60 mL Topical Once  . docusate sodium  100 mg Oral Daily  . famotidine (PEPCID) IV  20 mg Intravenous Q24H  . povidone-iodine  2 application Topical Once   Continuous Infusions: . dextrose 5 % and 0.9 % NaCl with KCl 20 mEq/L       LOS: 0 days    Riven Beebe Jaynie Collins, MD [facility] Pager [phone]  If 7PM-7AM, please contact night-coverage [email or web address] Password [facility] 12/14/2016, 10:48 AM

## 2016-12-14 NOTE — Progress Notes (Signed)
Initial Nutrition Assessment  DOCUMENTATION CODES:   Not applicable  INTERVENTION:  Once diet advances, provide Ensure Enlive po BID, each supplement provides 350 kcal and 20 grams of protein.  Recommend obtaining new weight to fully assess weight trends.  NUTRITION DIAGNOSIS:   Increased nutrient needs related to  (post op healing) as evidenced by estimated needs.  GOAL:   Patient will meet greater than or equal to 90% of their needs  MONITOR:   Supplement acceptance, Labs, Diet advancement, Weight trends, Skin, I & O's  REASON FOR ASSESSMENT:   Consult, Low Braden Hip fracture protocol  ASSESSMENT:   83 y.o. female with history of dementia and nonverbal was brought to the ER after patient had an unwitnessed fall at the skilled nursing facility. Patient is nonverbal at baseline and does not provide any history.  In the ER patient had CT of the head and maxillofacial C-spine and hip. Showed right hip periprosthetic fracture.   Pt was actively being transferred to OR during time of visit. RD unable to obtain nutrition history. Noted no recent weight recorded. Recommend obtaining new weight to fully assess weight trends.  Unable to complete Nutrition-Focused physical exam at this time.  RD to order nutritional supplements to aid in post op healing once diet advances.   Labs and medications reviewed.   Diet Order:  Diet NPO time specified Diet NPO time specified Except for: Sips with Meds  Skin:  Reviewed, no issues  Last BM:  Unknown  Height:   Ht Readings from Last 1 Encounters:  02/27/15  (1.575 m)    Weight:   Wt Readings from Last 1 Encounters:  02/27/15 101 lb (45.8 kg)    Ideal Body Weight:  50 kg  BMI:  There is no height or weight on file to calculate BMI.  Estimated Nutritional Needs:   Kcal:  1400-1600  Protein:  55-65 grams   Fluid:  >/= 1.5 L/day  EDUCATION NEEDS:   No education needs identified at this time  Roslyn Smiling, MS,  RD, LDN Pager # [phone] After hours/ weekend pager # [phone]

## 2016-12-14 NOTE — H&P (Signed)
History and Physical    Jill [name] ZOX:[id number] DOB: [birth year] DOA: 12/13/2016  PCP: No PCP Per Patient  Patient coming from: Skilled nursing facility.  Chief Complaint: Fall.  HPI: Jill [name] is a 83 y.o. female with history of dementia and nonverbal was brought to the ER after patient had an unwitnessed fall at the skilled nursing facility. Patient is nonverbal at baseline and does not provide any history. Exact circumstances of the fall was not known.   ED Course: In the ER patient had CT of the head and maxillofacial C-spine and hip. Showed right hip periprosthetic fracture. Dr. Magnus Ivan was consulted and patient has been admitted for possible requirement of surgery. On exam patient is not in distress. Has a contusion on the chin.  Review of Systems: As per HPI, rest all negative.   Past Medical History:  Diagnosis Date  . Dementia     Past Surgical History:  Procedure Laterality Date  . HIP ARTHROPLASTY Right 02/25/2015   Procedure: ARTHROPLASTY BIPOLAR HIP (HEMIARTHROPLASTY);  Surgeon: Eldred Manges, MD;  Location: [facility] OR;  Service: Orthopedics;  Laterality: Right;     reports that she has never smoked. She has never used smokeless tobacco. Her alcohol and drug histories are not on file.  Allergies  Allergen Reactions  . Penicillins Rash    Family History  Problem Relation Age of Onset  . Family history unknown: Yes    Prior to Admission medications   Medication Sig Start Date End Date Taking? Authorizing Provider  aspirin EC 81 MG tablet Take 81 mg by mouth daily.   Yes Historical Provider, MD  clonazePAM (KLONOPIN) 0.5 MG tablet Take 0.5 mg by mouth 2 (two) times daily as needed for anxiety.   Yes Historical Provider, MD  polyethylene glycol (MIRALAX / GLYCOLAX) packet Take 17 g by mouth at bedtime.   Yes Historical Provider, MD  ranitidine (ZANTAC) 150 MG tablet Take 150 mg by mouth 2 (two) times daily.   Yes Historical Provider, MD  traZODone  (DESYREL) 50 MG tablet Take 2 tablets (100 mg total) by mouth at bedtime as needed for sleep. Patient taking differently: Take 50 mg by mouth 3 (three) times daily.  07/30/14  Yes Ardith Dark, MD    Physical Exam: Vitals:   12/13/16 2317 12/14/16 0000 12/14/16 0100 12/14/16 0338  BP: (!) 155/72 (!) 152/62 (!) 143/62 (!) 167/54  Pulse: 60 (!) 50 (!) 47 (!) 52  Resp: Temp:    98.3 F (36.8 C)  TempSrc:    Oral  SpO2: 99% 99% 96% 99%      Constitutional: Moderately built and nourished. Vitals:   12/13/16 2317 12/14/16 0000 12/14/16 0100 12/14/16 0338  BP: (!) 155/72 (!) 152/62 (!) 143/62 (!) 167/54  Pulse: 60 (!) 50 (!) 47 (!) 52  Resp: Temp:    98.3 F (36.8 C)  TempSrc:    Oral  SpO2: 99% 99% 96% 99%   Eyes: Anicteric no pallor. ENMT: Contusion of the chin. Neck: No neck rigidity or mass felt. Respiratory: No rhonchi or crepitations. Cardiovascular: S1 and S2 heard no murmurs appreciated. Abdomen: Soft nontender bowel sounds present. Musculoskeletal: No edema. Skin: No rash. Contusion of the chin. Neurologic: Alert awake does not follow commands. Psychiatric: Patient does not follow commands.   Labs on Admission: I have personally reviewed following labs and imaging studies  CBC:  Recent Labs Lab 12/13/16 1824  WBC 11.8*  NEUTROABS 10.4*  HGB 10.3*  HCT 31.1*  MCV 91.5  PLT 318   Basic Metabolic Panel:  Recent Labs Lab 12/13/16 1824  NA 135  K 4.0  CL 100*  CO2 27  GLUCOSE 118*  BUN 17  CREATININE 0.81  CALCIUM 9.0   GFR: CrCl cannot be calculated (Unknown ideal weight.). Liver Function Tests:  Recent Labs Lab 12/13/16 1824  AST 17  ALT 12*  ALKPHOS 85  BILITOT 0.4  PROT 6.2*  ALBUMIN 3.4*   No results for input(s): LIPASE, AMYLASE in the last 168 hours. No results for input(s): AMMONIA in the last 168 hours. Coagulation Profile: No results for input(s): INR, PROTIME in the last 168 hours. Cardiac  Enzymes: No results for input(s): CKTOTAL, CKMB, CKMBINDEX, TROPONINI in the last 168 hours. BNP (last 3 results) No results for input(s): PROBNP in the last 8760 hours. HbA1C: No results for input(s): HGBA1C in the last 72 hours. CBG: No results for input(s): GLUCAP in the last 168 hours. Lipid Profile: No results for input(s): CHOL, HDL, LDLCALC, TRIG, CHOLHDL, LDLDIRECT in the last 72 hours. Thyroid Function Tests: No results for input(s): TSH, T4TOTAL, FREET4, T3FREE, THYROIDAB in the last 72 hours. Anemia Panel: No results for input(s): VITAMINB12, FOLATE, FERRITIN, TIBC, IRON, RETICCTPCT in the last 72 hours. Urine analysis:    Component Value Date/Time   COLORURINE YELLOW 12/13/2016 2000   APPEARANCEUR HAZY (A) 12/13/2016 2000   LABSPEC 1.013 12/13/2016 2000   PHURINE 7.0 12/13/2016 2000   GLUCOSEU NEGATIVE 12/13/2016 2000   HGBUR NEGATIVE 12/13/2016 2000   BILIRUBINUR NEGATIVE 12/13/2016 2000   KETONESUR NEGATIVE 12/13/2016 2000   PROTEINUR NEGATIVE 12/13/2016 2000   NITRITE POSITIVE (A) 12/13/2016 2000   LEUKOCYTESUR NEGATIVE 12/13/2016 2000   Sepsis Labs: (procalcitonin:4,lacticidven:4) )No results found for this or any previous visit (from the past 240 hour(s)).   Radiological Exams on Admission: Dg Chest 1 View  Result Date: 12/13/2016 CLINICAL DATA:  Recent fall EXAM: CHEST 1 VIEW COMPARISON:  02/25/2015 FINDINGS: Cardiac shadow is mildly prominent but accentuated by the frontal technique. Aortic calcifications are again seen. The lungs are well aerated bilaterally. Large skin fold is noted laterally on the right. No pneumothorax is seen. No focal infiltrate is noted. No acute rib abnormality is seen. Chronic changes in the right eighth rib are seen posteriorly. T9 compression deformity is noted which is new from the prior exam but of uncertain chronicity. IMPRESSION: T9 compression deformity of uncertain chronicity. Chronic changes without other acute  abnormality. Electronically Signed   By: Alcide Clever M.D.   On: 12/13/2016 19:41   Dg Pelvis 1-2 Views  Result Date: 12/13/2016 CLINICAL DATA:  Recent fall with pelvic pain, initial encounter EXAM: PELVIS - 1-2 VIEW COMPARISON:  None. FINDINGS: The pelvic ring is intact. Right hip prosthesis is noted. No soft tissue abnormality is seen. IMPRESSION: No acute abnormality noted. Electronically Signed   By: Alcide Clever M.D.   On: 12/13/2016 19:45   Ct Head Wo Contrast  Result Date: 12/13/2016 CLINICAL DATA:  Fall.  Confusion. EXAM: CT HEAD WITHOUT CONTRAST CT MAXILLOFACIAL WITHOUT CONTRAST CT CERVICAL SPINE WITHOUT CONTRAST TECHNIQUE: Multidetector CT imaging of the head, cervical spine, and maxillofacial structures were performed using the standard protocol without intravenous contrast. Multiplanar CT image reconstructions of the cervical spine and maxillofacial structures were also generated. COMPARISON:  07/27/2014 head CT. FINDINGS: CT HEAD FINDINGS Brain: No evidence of parenchymal hemorrhage or extra-axial fluid collection. No mass lesion, mass effect,  or midline shift. No CT evidence of acute infarction. Generalized cerebral volume loss. Nonspecific moderate subcortical and periventricular white matter hypodensity, most in keeping with chronic small vessel ischemic change. No ventriculomegaly. Vascular: Intracranial atherosclerosis.  No acute abnormality. Skull: No evidence of calvarial fracture. Sinuses/Orbits: No fluid levels. Mucoperiosteal thickening in the bilateral ethmoidal air cells and bilateral maxillary sinuses. Other:  The mastoid air cells are unopacified. CT MAXILLOFACIAL FINDINGS Motion degraded scan. Osseous: No fracture or mandibular dislocation. No destructive process. Orbits: Negative. No traumatic or inflammatory finding. Sinuses: No fluid levels. Mucoperiosteal thickening in the bilateral ethmoidal air cells and bilateral maxillary sinuses. Soft tissues: Asymmetric right cheek  contusion. Otherwise no acute abnormality. CT CERVICAL SPINE FINDINGS Motion degraded scan. Alignment: Straightening of the cervical spine. No subluxation. Dens is well positioned between the lateral masses of C1. Skull base and vertebrae: No acute fracture. No primary bone lesion or focal pathologic process. Soft tissues and spinal canal: No prevertebral fluid or swelling. No visible canal hematoma. Disc levels: Mild degenerative disc disease throughout the cervical spine. Moderate facet arthropathy bilaterally in the cervical spine . No significant degenerative foraminal stenosis. Upper chest: Negative. Other: Visualized mastoid air cells appear clear. No discrete thyroid nodules. No pathologically enlarged cervical nodes. IMPRESSION: 1. No evidence of acute intracranial abnormality. No evidence of calvarial fracture . 2. Moderate chronic small vessel ischemia and generalized cerebral volume loss. 3. Mild right cheek contusion.  No maxillofacial fracture. 4. Chronic paranasal sinusitis. 5. No cervical spine fracture or subluxation. 6. Mild-to-moderate degenerative changes in the cervical spine as detailed . Electronically Signed   By: Delbert Phenix M.D.   On: 12/13/2016 19:56   Ct Cervical Spine Wo Contrast  Result Date: 12/13/2016 CLINICAL DATA:  Fall.  Confusion. EXAM: CT HEAD WITHOUT CONTRAST CT MAXILLOFACIAL WITHOUT CONTRAST CT CERVICAL SPINE WITHOUT CONTRAST TECHNIQUE: Multidetector CT imaging of the head, cervical spine, and maxillofacial structures were performed using the standard protocol without intravenous contrast. Multiplanar CT image reconstructions of the cervical spine and maxillofacial structures were also generated. COMPARISON:  07/27/2014 head CT. FINDINGS: CT HEAD FINDINGS Brain: No evidence of parenchymal hemorrhage or extra-axial fluid collection. No mass lesion, mass effect, or midline shift. No CT evidence of acute infarction. Generalized cerebral volume loss. Nonspecific moderate  subcortical and periventricular white matter hypodensity, most in keeping with chronic small vessel ischemic change. No ventriculomegaly. Vascular: Intracranial atherosclerosis.  No acute abnormality. Skull: No evidence of calvarial fracture. Sinuses/Orbits: No fluid levels. Mucoperiosteal thickening in the bilateral ethmoidal air cells and bilateral maxillary sinuses. Other:  The mastoid air cells are unopacified. CT MAXILLOFACIAL FINDINGS Motion degraded scan. Osseous: No fracture or mandibular dislocation. No destructive process. Orbits: Negative. No traumatic or inflammatory finding. Sinuses: No fluid levels. Mucoperiosteal thickening in the bilateral ethmoidal air cells and bilateral maxillary sinuses. Soft tissues: Asymmetric right cheek contusion. Otherwise no acute abnormality. CT CERVICAL SPINE FINDINGS Motion degraded scan. Alignment: Straightening of the cervical spine. No subluxation. Dens is well positioned between the lateral masses of C1. Skull base and vertebrae: No acute fracture. No primary bone lesion or focal pathologic process. Soft tissues and spinal canal: No prevertebral fluid or swelling. No visible canal hematoma. Disc levels: Mild degenerative disc disease throughout the cervical spine. Moderate facet arthropathy bilaterally in the cervical spine . No significant degenerative foraminal stenosis. Upper chest: Negative. Other: Visualized mastoid air cells appear clear. No discrete thyroid nodules. No pathologically enlarged cervical nodes. IMPRESSION: 1. No evidence of acute intracranial abnormality. No evidence  of calvarial fracture . 2. Moderate chronic small vessel ischemia and generalized cerebral volume loss. 3. Mild right cheek contusion.  No maxillofacial fracture. 4. Chronic paranasal sinusitis. 5. No cervical spine fracture or subluxation. 6. Mild-to-moderate degenerative changes in the cervical spine as detailed . Electronically Signed   By: Delbert Phenix M.D.   On: 12/13/2016 19:56    Ct Hip Right Wo Contrast  Result Date: 12/13/2016 CLINICAL DATA:  Right hip pain after a fall. Unable to bear weight. Plain radiograph shows acute proximal right femoral fracture. Dementia. EXAM: CT OF THE RIGHT HIP WITHOUT CONTRAST TECHNIQUE: Multidetector CT imaging of the right hip was performed according to the standard protocol. Multiplanar CT image reconstructions were also generated. COMPARISON:  Pelvic and right femoral radiographs 12/14/2006 FINDINGS: Bones/Joint/Cartilage Postoperative changes with right hip hemiarthroplasty. Non cemented femoral component. There is an acute appearing. Prosthetic fracture extending from the distal tip of the proxy cysts proximally. Cerclage wires surround the inter trochanteric region with nondisplaced fracture of the greater trochanter, possibly acute. No significant displacement of fractures. No dislocation of the hip joint. Ligaments Suboptimally assessed by CT. Muscles and Tendons Mild fullness in loss of fat planes in the anterior compartment musculature of the right thigh likely representing intramuscular hematoma. Soft tissues Vascular calcifications are present. Small lipoma demonstrated adjacent to the superior acetabulum laterally. Calcifications in the aorta and iliac arteries. Visualized pelvic bowel loops are not distended. No mass demonstrated in the pelvis although visualization is limited by streak artifact from the right hip arthroplasty. IMPRESSION: Right hip hemiarthroplasty with periprosthetic nondisplaced acute fractures. Probable associated intramuscular hematoma in the anterior compartment. Electronically Signed   By: Burman Nieves M.D.   On: 12/13/2016 23:49   Ct Femur Right Wo Contrast  Result Date: 12/13/2016 CLINICAL DATA:  Right hip pain after a fall. Unable to bear weight. Plain radiograph shows acute proximal right femoral fracture. Dementia. EXAM: CT OF THE RIGHT HIP WITHOUT CONTRAST TECHNIQUE: Multidetector CT imaging of the  right hip was performed according to the standard protocol. Multiplanar CT image reconstructions were also generated. COMPARISON:  Pelvic and right femoral radiographs 12/14/2006 FINDINGS: Bones/Joint/Cartilage Postoperative changes with right hip hemiarthroplasty. Non cemented femoral component. There is an acute appearing. Prosthetic fracture extending from the distal tip of the proxy cysts proximally. Cerclage wires surround the inter trochanteric region with nondisplaced fracture of the greater trochanter, possibly acute. No significant displacement of fractures. No dislocation of the hip joint. Ligaments Suboptimally assessed by CT. Muscles and Tendons Mild fullness in loss of fat planes in the anterior compartment musculature of the right thigh likely representing intramuscular hematoma. Soft tissues Vascular calcifications are present. Small lipoma demonstrated adjacent to the superior acetabulum laterally. Calcifications in the aorta and iliac arteries. Visualized pelvic bowel loops are not distended. No mass demonstrated in the pelvis although visualization is limited by streak artifact from the right hip arthroplasty. IMPRESSION: Right hip hemiarthroplasty with periprosthetic nondisplaced acute fractures. Probable associated intramuscular hematoma in the anterior compartment. Electronically Signed   By: Burman Nieves M.D.   On: 12/13/2016 23:49   Dg Femur Min 2 Views Left  Result Date: 12/13/2016 CLINICAL DATA:  Recent fall with left leg pain, initial encounter EXAM: LEFT FEMUR 2 VIEWS COMPARISON:  None FINDINGS: No acute fracture or dislocation is noted. Diffuse vascular calcifications are seen. The visualized pelvis is within normal limits. No soft tissue abnormality is noted. IMPRESSION: No acute abnormality seen. Electronically Signed   By: Eulah Pont.D.  On: 12/13/2016 19:44   Dg Femur Min 2 Views Right  Result Date: 12/13/2016 CLINICAL DATA:  Recent fall with right leg pain, initial  encounter EXAM: RIGHT FEMUR 2 VIEWS COMPARISON:  None. FINDINGS: Right hip prosthesis is noted. It appears well seated within the acetabulum. No acute fracture or dislocation is noted. Diffuse vascular calcifications are seen. The visualized pelvis is within normal limits. IMPRESSION: No acute abnormality noted. Electronically Signed   By: Alcide Clever M.D.   On: 12/13/2016 19:41   Ct Maxillofacial Wo Contrast  Result Date: 12/13/2016 CLINICAL DATA:  Fall.  Confusion. EXAM: CT HEAD WITHOUT CONTRAST CT MAXILLOFACIAL WITHOUT CONTRAST CT CERVICAL SPINE WITHOUT CONTRAST TECHNIQUE: Multidetector CT imaging of the head, cervical spine, and maxillofacial structures were performed using the standard protocol without intravenous contrast. Multiplanar CT image reconstructions of the cervical spine and maxillofacial structures were also generated. COMPARISON:  07/27/2014 head CT. FINDINGS: CT HEAD FINDINGS Brain: No evidence of parenchymal hemorrhage or extra-axial fluid collection. No mass lesion, mass effect, or midline shift. No CT evidence of acute infarction. Generalized cerebral volume loss. Nonspecific moderate subcortical and periventricular white matter hypodensity, most in keeping with chronic small vessel ischemic change. No ventriculomegaly. Vascular: Intracranial atherosclerosis.  No acute abnormality. Skull: No evidence of calvarial fracture. Sinuses/Orbits: No fluid levels. Mucoperiosteal thickening in the bilateral ethmoidal air cells and bilateral maxillary sinuses. Other:  The mastoid air cells are unopacified. CT MAXILLOFACIAL FINDINGS Motion degraded scan. Osseous: No fracture or mandibular dislocation. No destructive process. Orbits: Negative. No traumatic or inflammatory finding. Sinuses: No fluid levels. Mucoperiosteal thickening in the bilateral ethmoidal air cells and bilateral maxillary sinuses. Soft tissues: Asymmetric right cheek contusion. Otherwise no acute abnormality. CT CERVICAL SPINE  FINDINGS Motion degraded scan. Alignment: Straightening of the cervical spine. No subluxation. Dens is well positioned between the lateral masses of C1. Skull base and vertebrae: No acute fracture. No primary bone lesion or focal pathologic process. Soft tissues and spinal canal: No prevertebral fluid or swelling. No visible canal hematoma. Disc levels: Mild degenerative disc disease throughout the cervical spine. Moderate facet arthropathy bilaterally in the cervical spine . No significant degenerative foraminal stenosis. Upper chest: Negative. Other: Visualized mastoid air cells appear clear. No discrete thyroid nodules. No pathologically enlarged cervical nodes. IMPRESSION: 1. No evidence of acute intracranial abnormality. No evidence of calvarial fracture . 2. Moderate chronic small vessel ischemia and generalized cerebral volume loss. 3. Mild right cheek contusion.  No maxillofacial fracture. 4. Chronic paranasal sinusitis. 5. No cervical spine fracture or subluxation. 6. Mild-to-moderate degenerative changes in the cervical spine as detailed . Electronically Signed   By: Delbert Phenix M.D.   On: 12/13/2016 19:56    EKG: Independently reviewed. Normal sinus rhythm. Acceptable QTC.  Assessment/Plan Active Problems:   Dementia, multi-infarct   Fracture of femoral neck, right (HCC)   Protein-calorie malnutrition, severe (HCC)   Fall   UTI (urinary tract infection)   Hip fracture (HCC)    1. Right hip periprosthetic fracture - Dr. Magnus Ivan will be seeing patient in consult for possible requirement for surgery. If surgery plan patient will be at moderate risk for intermediate risk procedure. 2. Elevated blood pressure - closely follow blood pressure trends. 3. Advanced dementia - no acute issues.  Urine shows nitrites positive but there are no WBCs. Urine culture has been sent.   DVT prophylaxis: SCDs. Code Status: DO NOT RESUSCITATE.  Family Communication: No family at the bedside.    Disposition Plan: Back to skilled  nursing facility.  Consults called: Orthopedics.  Admission status: Inpatient.    Eduard Clos MD [facility] Pager [phone].  If 7PM-7AM, please contact night-coverage [email or web address] Password [facility]  12/14/2016, 4:17 AM

## 2016-12-14 NOTE — Anesthesia Preprocedure Evaluation (Signed)
Anesthesia Evaluation  Patient identified by MRN, date of birth, ID band Patient awake    Reviewed: Allergy & Precautions, H&P , NPO status , Patient's Chart, lab work & pertinent test results  Airway Mallampati: II   Neck ROM: full    Dental   Pulmonary neg pulmonary ROS,    breath sounds clear to auscultation       Cardiovascular + Peripheral Vascular Disease   Rhythm:regular Rate:Normal     Neuro/Psych dementia TIA   GI/Hepatic   Endo/Other    Renal/GU      Musculoskeletal   Abdominal   Peds  Hematology  (+) Blood dyscrasia, anemia ,   Anesthesia Other Findings   Reproductive/Obstetrics                             Anesthesia Physical Anesthesia Plan  ASA: III  Anesthesia Plan: General   Post-op Pain Management:    Induction: Intravenous  Airway Management Planned: Oral ETT  Additional Equipment:   Intra-op Plan:   Post-operative Plan: Possible Post-op intubation/ventilation  Informed Consent: I have reviewed the patients History and Physical, chart, labs and discussed the procedure including the risks, benefits and alternatives for the proposed anesthesia with the patient or authorized representative who has indicated his/her understanding and acceptance.     Plan Discussed with: CRNA, Anesthesiologist and Surgeon  Anesthesia Plan Comments: (Discussed perioperative suspension of DNR with patient's daughter.  She understands and agrees that would be consistent with their wishes.)        Anesthesia Quick Evaluation

## 2016-12-15 ENCOUNTER — Encounter (HOSPITAL_COMMUNITY): Payer: Self-pay | Admitting: Orthopaedic Surgery

## 2016-12-15 DIAGNOSIS — N390 Urinary tract infection, site not specified: Secondary | ICD-10-CM

## 2016-12-15 DIAGNOSIS — E43 Unspecified severe protein-calorie malnutrition: Secondary | ICD-10-CM

## 2016-12-15 LAB — CBC
HEMATOCRIT: 22 % — AB (ref 36.0–46.0)
HEMOGLOBIN: 7.3 g/dL — AB (ref 12.0–15.0)
MCH: 30 pg (ref 26.0–34.0)
MCHC: 33.2 g/dL (ref 30.0–36.0)
MCV: 90.5 fL (ref 78.0–100.0)
Platelets: 254 10*3/uL (ref 150–400)
RBC: 2.43 MIL/uL — AB (ref 3.87–5.11)
RDW: 13.1 % (ref 11.5–15.5)
WBC: 8.2 10*3/uL (ref 4.0–10.5)

## 2016-12-15 LAB — BASIC METABOLIC PANEL
ANION GAP: 7 (ref 5–15)
BUN: 13 mg/dL (ref 6–20)
CALCIUM: 8.4 mg/dL — AB (ref 8.9–10.3)
CHLORIDE: 104 mmol/L (ref 101–111)
CO2: 26 mmol/L (ref 22–32)
Creatinine, Ser: 0.87 mg/dL (ref 0.44–1.00)
GFR calc non Af Amer: 59 mL/min — ABNORMAL LOW (ref >60)
Glucose, Bld: 136 mg/dL — ABNORMAL HIGH (ref 65–99)
Potassium: 4.2 mmol/L (ref 3.5–5.1)
Sodium: 137 mmol/L (ref 135–145)

## 2016-12-15 LAB — HEMOGLOBIN AND HEMATOCRIT, BLOOD
HCT: 23.9 % — ABNORMAL LOW (ref 36.0–46.0)
Hemoglobin: 7.8 g/dL — ABNORMAL LOW (ref 12.0–15.0)

## 2016-12-15 MED ORDER — FAMOTIDINE 20 MG PO TABS
20.0000 mg | ORAL_TABLET | Freq: Every day | ORAL | Status: DC
  Administered 2016-12-16 – 2016-12-17 (×2): 20 mg via ORAL
  Filled 2016-12-15 (×2): qty 1

## 2016-12-15 NOTE — Progress Notes (Signed)
OT Cancellation Note  Patient Details Name: Jill [name] MRN: [id number] DOB: 1929-07-28   Cancelled Treatment:     Reason Eval not completed/Pt not seen: Orders received, chart reviewed and screened pt for OT. Pt is from SNF, with advanced dementia & non-verbal at baseline. She is s/p R hip hemiarthroplasty revision after an unobserved fall at SNF. She has orders in her chart for bed rest HOB 30* with posterior hip precautions. Also Per chart review plan is to return to SNF when medically able. Defer further OT assessment and plan of care to SNF Rehab. Will sign off acute OT at this time.   Charletta Cousin, Amy Beth Dixon, OTR/L 12/15/2016, 8:19 AM

## 2016-12-15 NOTE — Progress Notes (Signed)
Subjective: 1 Day Post-Op Procedure(s) (LRB): RIGHT HIP HEMIARTHROPLASTY REVISION WITH CABLE PLACEMENT (Right) Patient reports pain as 0 on 0-10 scale.    Objective: Vital signs in last 24 hours: Temp:  [97.7 F (36.5 C)-99.3 F (37.4 C)] 99.3 F (37.4 C) (04/17 0839) Pulse Rate:  [48-100] 83 (04/17 0839) Resp:  [12-25] 16 (04/17 0839) BP: (121-155)/(50-90) 155/90 (04/17 0839) SpO2:  [77 %-100 %] 98 % (04/17 0839)  Intake/Output from previous day: 04/16 0701 - 04/17 0700 In: 3347.5 [I.V.:2497.5; IV Piggyback:50] Out: 570 [Urine:320; Blood:250] Intake/Output this shift: Total I/O In: 100 [P.O.:100] Out: 300 [Urine:300]   Recent Labs  12/13/16 1824 12/14/16 0433 12/15/16 0637 12/15/16 1134  HGB 10.3* 9.7* 7.3* 7.8*    Recent Labs  12/14/16 0433 12/15/16 0637 12/15/16 1134  WBC 6.8 8.2  --   RBC 3.28* 2.43*  --   HCT 30.0* 22.0* 23.9*  PLT 271 254  --     Recent Labs  12/14/16 0433 12/15/16 0637  NA 138 137  K 4.2 4.2  CL 101 104  CO2 30 26  BUN 13 13  CREATININE 0.81 0.87  GLUCOSE 110* 136*  CALCIUM 9.0 8.4*   No results for input(s): LABPT, INR in the last 72 hours.  Neurologically intact Dg Pelvis Portable  Result Date: 12/14/2016 CLINICAL DATA:  Right hip hemiarthroplasty revision, with cerclage wire placement. Initial encounter. EXAM: PORTABLE PELVIS 1-2 VIEWS COMPARISON:  CT of the right hip performed 12/13/2016 FINDINGS: The patient is status post right hip hemiarthroplasty revision, with placement of cerclage wires, transfixing the periprosthetic fracture in grossly anatomic alignment. No new fractures are seen. Overlying skin staples are seen, with scattered soft tissue air. The visualized bowel gas pattern is grossly unremarkable. The left hip joint is unremarkable in appearance. IMPRESSION: Status post right hip hemiarthroplasty revision, with placement of cerclage wires, transfixing the periprosthetic fracture in grossly anatomic alignment.  Electronically Signed   By: Roanna Raider M.D.   On: 12/14/2016 16:37   Dg C-arm 1-60 Min  Result Date: 12/14/2016 CLINICAL DATA:  Right hip hemiarthroplasty revision with cable placement. Fluoro time reported is 7 seconds. EXAM: DG C-ARM 61-120 MIN; OPERATIVE RIGHT HIP WITH PELVIS COMPARISON:  Preoperative CT scan through the right hip and femur FINDINGS: 3 fluoro spot images reveal the stem portion of the prosthesis. A fracture line in the native bone is noted along the medial aspect of the proximal third of the femur. Four metallic cables are placed circumferentially around the proximal humeral shaft. The fracture line extends below the lower most of the wire cables. IMPRESSION: 3 fluoro spot images from the operating room revealing placement of for wire cables around the fractured proximal femoral shaft. The inferior aspect of fracture line extends below the lowermost cable. Electronically Signed   By: David  [place] M.D.   On: 12/14/2016 15:10   Dg Hip Operative Unilat W Or W/o Pelvis Right  Result Date: 12/14/2016 CLINICAL DATA:  Right hip hemiarthroplasty revision with cable placement. Fluoro time reported is 7 seconds. EXAM: DG C-ARM 61-120 MIN; OPERATIVE RIGHT HIP WITH PELVIS COMPARISON:  Preoperative CT scan through the right hip and femur FINDINGS: 3 fluoro spot images reveal the stem portion of the prosthesis. A fracture line in the native bone is noted along the medial aspect of the proximal third of the femur. Four metallic cables are placed circumferentially around the proximal humeral shaft. The fracture line extends below the lower most of the wire cables. IMPRESSION: 3  fluoro spot images from the operating room revealing placement of for wire cables around the fractured proximal femoral shaft. The inferior aspect of fracture line extends below the lowermost cable. Electronically Signed   By: David  [place] M.D.   On: 12/14/2016 15:10    Assessment/Plan: 1 Day Post-Op Procedure(s)  (LRB): RIGHT HIP HEMIARTHROPLASTY REVISION WITH CABLE PLACEMENT (Right) Up with therapy, she can be WBAT with walker. With dementia when she had previous surgery for hemiarthroplasty it was a few days before she could tolerate getting up due to not being able to understand why she was having pain. Can work on transfers to [employer]. My cell [phone]  Eldred Manges 12/15/2016, 1:08 PM

## 2016-12-15 NOTE — Progress Notes (Signed)
Nutrition Follow-up  DOCUMENTATION CODES:   Severe malnutrition in context of chronic illness  INTERVENTION:  Continue Ensure Enlive po BID, each supplement provides 350 kcal and 20 grams of protein.  Encourage adequate PO intake.   NUTRITION DIAGNOSIS:   Malnutrition (severe) related to chronic illness as evidenced by severe depletion of body fat, severe depletion of muscle mass; ongoing  GOAL:   Patient will meet greater than or equal to 90% of their needs; progressing  MONITOR:   PO intake, Supplement acceptance, Labs, Weight trends, Skin, I & O's  REASON FOR ASSESSMENT:   Consult, Low Braden Hip fracture protocol  ASSESSMENT:   83 y.o. female with history of dementia and nonverbal was brought to the ER after patient had an unwitnessed fall at the skilled nursing facility. Patient is nonverbal at baseline and does not provide any history.  In the ER patient had CT of the head and maxillofacial C-spine and hip. Showed right hip periprosthetic fracture.   Procedure (4/16): RIGHT HIP HEMIARTHROPLASTY REVISION WITH CABLE PLACEMENT (Right)  No family at bedside. Pt nonverbal. Meal completion 100% this AM. Pt currently has Ensure ordered and has been consuming them. Will continue with orders. Nutrition-Focused physical exam completed. Findings are severe fat depletion, severe muscle depletion, and mild edema.   Labs and medications reviewed.   Diet Order:  Diet regular Room service appropriate? Yes; Fluid consistency: Thin  Skin:   (Incision on R hip/leg)  Last BM:  Unknown  Height:   Ht Readings from Last 1 Encounters:  02/27/15  (1.575 m)    Weight:   Wt Readings from Last 1 Encounters:  02/27/15 101 lb (45.8 kg)    Ideal Body Weight:  50 kg  BMI:  There is no height or weight on file to calculate BMI.  Estimated Nutritional Needs:   Kcal:  1400-1600  Protein:  55-65 grams   Fluid:  >/= 1.5 L/day  EDUCATION NEEDS:   No education needs  identified at this time  Roslyn Smiling, MS, RD, LDN Pager # [phone] After hours/ weekend pager # [phone]

## 2016-12-15 NOTE — Progress Notes (Signed)
Subjective: Eating breakfast with nursing student.     Objective: Vital signs in last 24 hours: Temp:  [97.7 F (36.5 C)-99.3 F (37.4 C)] 99.3 F (37.4 C) (04/17 0839) Pulse Rate:  [48-100] 83 (04/17 0839) Resp:  [12-25] 16 (04/17 0839) BP: (121-160)/(50-90) 155/90 (04/17 0839) SpO2:  [77 %-100 %] 98 % (04/17 0839)  Intake/Output from previous day: 04/16 0701 - 04/17 0700 In: 3347.5 [I.V.:2497.5; IV Piggyback:50] Out: 570 [Urine:320; Blood:250] Intake/Output this shift: Total I/O In: -  Out: 200 [Urine:200]   Recent Labs  12/13/16 1824 12/14/16 0433 12/15/16 0637  HGB 10.3* 9.7* 7.3*    Recent Labs  12/14/16 0433 12/15/16 0637  WBC 6.8 8.2  RBC 3.28* 2.43*  HCT 30.0* 22.0*  PLT 271 254    Recent Labs  12/14/16 0433 12/15/16 0637  NA 138 137  K 4.2 4.2  CL 101 104  CO2 30 26  BUN 13 13  CREATININE 0.81 0.87  GLUCOSE 110* 136*  CALCIUM 9.0 8.4*   No results for input(s): LABPT, INR in the last 72 hours.  Exam:  Awake eating.  Hx dementia and does not answer questions.   aquacel dressing intact.  bilat calves nontender.  NVI  Assessment/Plan: Continue present care.  Hgb 7.3 and may need transfusion PRBC's.  Will recheck H/H this afternoon.    Zonia Kief 12/15/2016, 9:02 AM

## 2016-12-15 NOTE — Evaluation (Signed)
Physical Therapy Evaluation Patient Details Name: Jill [name] MRN: [id number] DOB: 28-Dec-1928 Today's Date: 12/15/2016   History of Present Illness  Pt is a 83 yo female admitted with R hip pain experienced after a fall. Pt s/p R hip hemiarthroplasty revison with cable placement. Pt has dementia and is nonverbal   Clinical Impression  Patient is s/p above surgery resulting in functional limitations due to the deficits listed below (see PT Problem List). Pt is nonverbal with communication secondary to dementia. Pt initiates movement but then stops when she experiences pain in her R hip. Pt maxAx2 for bed mobility and stand pivot transfer to chair.  Patient will benefit from skilled PT to increase their independence and safety with mobility to allow discharge to the venue listed below.       Follow Up Recommendations SNF;Supervision/Assistance - 24 hour    Equipment Recommendations   (DME from [facility])    Recommendations for Other Services OT consult     Precautions / Restrictions Precautions Precautions: Posterior Hip Precaution Booklet Issued: Yes (comment) Precaution Comments: daughter educated on posterior hip precautions Required Braces or Orthoses: Knee Immobilizer - Right Restrictions Weight Bearing Restrictions: Yes RLE Weight Bearing: Weight bearing as tolerated      Mobility  Bed Mobility Overal bed mobility: Needs Assistance Bed Mobility: Supine to Sit     Supine to sit: Mod assist;+2 for physical assistance;HOB elevated     General bed mobility comments: pt able to initiate movement but stopped when hip became painful, daughter assisted with coming EoB  Transfers Overall transfer level: Needs assistance Equipment used: None Transfers: Stand Pivot Transfers   Stand pivot transfers: Max assist;+2 physical assistance       General transfer comment: pt able to initiate but when hip became painful required maxAx2 for stand pivot transfer to chair'      Balance Overall balance assessment: Needs assistance;History of Falls Sitting-balance support: Feet supported;Bilateral upper extremity supported Sitting balance-Leahy Scale: Poor Sitting balance - Comments: requires minA to maintain balance EoB Postural control: Posterior lean                                   Pertinent Vitals/Pain Pain Assessment: Faces Faces Pain Scale: Hurts a little bit Pain Descriptors / Indicators: Grimacing;Guarding Pain Intervention(s): Limited activity within patient's tolerance;Monitored during session;Patient requesting pain meds-RN notified  VSS    Home Living Family/patient expects to be discharged to:: Skilled nursing facility                      Prior Function Level of Independence: Needs assistance      ADL's / Homemaking Assistance Needed: lived with daughter who assisted in ADLs           Extremity/Trunk Assessment   Upper Extremity Assessment Upper Extremity Assessment: Overall WFL for tasks assessed    Lower Extremity Assessment Lower Extremity Assessment: RLE deficits/detail RLE Deficits / Details: R hip and knee ROM limited by pain  RLE: Unable to fully assess due to pain    Cervical / Trunk Assessment Cervical / Trunk Assessment: Kyphotic  Communication   Communication: Receptive difficulties;Expressive difficulties  Cognition Arousal/Alertness: Awake/alert Behavior During Therapy: WFL for tasks assessed/performed Overall Cognitive Status: History of cognitive impairments - at baseline  General Comments: pt has Alzheimer's with associated dementia      General Comments General comments (skin integrity, edema, etc.): daughter in room helped with bed mobility and transfer to recliner.     Assessment/Plan    PT Assessment Patient needs continued PT services  PT Problem List Decreased strength;Decreased range of motion;Decreased activity  tolerance;Decreased balance;Decreased mobility;Decreased knowledge of use of DME;Decreased safety awareness;Decreased knowledge of precautions;Decreased cognition;Decreased coordination;Pain       PT Treatment Interventions Gait training;Stair training;Functional mobility training;Therapeutic activities;Therapeutic exercise;Patient/family education    PT Goals (Current goals can be found in the Care Plan section)  Acute Rehab PT Goals PT Goal Formulation: With patient/family Time For Goal Achievement: 12/22/16 Potential to Achieve Goals: Fair    Frequency 7X/week    End of Session Equipment Utilized During Treatment: Right knee immobilizer;Gait belt Activity Tolerance: Patient limited by pain Patient left: in chair;with call bell/phone within reach;with chair alarm set;with family/visitor present Nurse Communication: Mobility status;Patient requests pain meds;Weight bearing status PT Visit Diagnosis: Other abnormalities of gait and mobility (R26.89);Unsteadiness on feet (R26.81);Repeated falls (R29.6);History of falling (Z91.81);Pain;Other symptoms and signs involving the nervous system (R29.898) Pain - Right/Left: Right Pain - part of body: Hip    Time: 0454-0981 PT Time Calculation (min) (ACUTE ONLY): 39 min   Charges:   PT Evaluation $PT Eval Moderate Complexity: 1 Procedure PT Treatments $Therapeutic Activity: 8-22 mins   PT G Codes:        [place] Hankin B. Beverely Risen PT, DPT Acute Rehabilitation  [phone] Pager [phone]    Elon Alas Fleet 12/15/2016, 3:20 PM

## 2016-12-15 NOTE — Progress Notes (Signed)
Orthopedic Tech Progress Note Patient Details:  [place] 1928/11/27 [id number]  Ortho Devices Type of Ortho Device: Knee Immobilizer Ortho Device/Splint Interventions: Application   Saul Fordyce 12/15/2016, 12:33 PM

## 2016-12-15 NOTE — Anesthesia Postprocedure Evaluation (Signed)
Anesthesia Post Note  Patient: Jill [name]  Procedure(s) Performed: Procedure(s) (LRB): RIGHT HIP HEMIARTHROPLASTY REVISION WITH CABLE PLACEMENT (Right)  Patient location during evaluation: PACU Anesthesia Type: General Level of consciousness: awake and alert and patient cooperative Pain management: pain level controlled Vital Signs Assessment: post-procedure vital signs reviewed and stable Respiratory status: spontaneous breathing and respiratory function stable Cardiovascular status: stable Anesthetic complications: no       Last Vitals:  Vitals:   12/15/16 0015 12/15/16 0517  BP: (!) 150/62 (!) 145/56  Pulse: 69 64  Resp: 15 15  Temp: 37 C 37.1 C    Last Pain:  Vitals:   12/15/16 0517  TempSrc: Axillary  PainSc:                  Shakeem Stern S

## 2016-12-15 NOTE — Progress Notes (Signed)
PROGRESS NOTE    Jill [name]  ZOX:[id number] DOB: March 09, 1929 DOA: 12/13/2016 PCP: No PCP Per Patient   Brief Narrative: 83 y.o. female with history of dementia and nonverbal was brought to the ER after patient had an unwitnessed fall at the skilled nursing facility. Patient is nonverbal at baseline and does not provide any history. Exact circumstances of the fall was not known.   In the ER, CT scan showed right hip periprosthetic fracture. Orthopedics was consulted and admitted for possible surgery.   Assessment & Plan:  #  Right hip fracture/periprosthetic nondisplaced acute fracture:  -as per CT scan, pt may have associated intramuscular hematoma in the anterior compartment. -s/p orthopedic surgery on 4/16 which include right hip hemiarthroplasty revision  -Continue pain management and supportive care -PT, OT evaluation. Social worker consult for possible skilled nursing facility discharge -Also consult appreciated -CT head with no acute finding.  #Acute blood loss anemia likely in the setting of surgery and it may also contributed by intramuscular hematoma. Repeat CBC with hemoglobin of 7.8. No plan to transfuse today. Repeat CBC in the morning.  #Mild sinus bradycardia: Likely contributed by morphine. Continue to monitor. Patient is not on beta blocker. Heart rate improved  #Elevated blood pressure likely contributed by pain: Not on antihypertensive medication. Continue to monitor.  #Dementia without behavioral disturbance: Continue supportive care. Discontinue IV fluid as she has good oral intake.  # ? UTI, site unspecified: UA has no WBC but nitrite is positive. Urine culture growing more than 100,000 gram-negative rods. Patient is nonverbal therefore unable to obtain history. I will start empiric antibiotics until final culture results.  DVT prophylaxis: SCD. No anticoagulant  because of intramuscular hematoma and anemia Code Status: DO NOT RESUSCITATE Family  Communication: No family at bedside Disposition Plan: Likely discharge to rehabilitation in 1-2 days depending on surgeon's plan    Consultants:   Orthopedics  Procedures: None Antimicrobials: None  Subjective: Patient was seen and examined at bedside. She was eating breakfast. She is nonverbal but looks comfortable. Objective: Vitals:   12/14/16 2045 12/15/16 0015 12/15/16 0517 12/15/16 0839  BP: 123/87 (!) 150/62 (!) 145/56 (!) 155/90  Pulse: 79 69 64 83  Resp: Temp: 98.6 F (37 C) 98.6 F (37 C) 98.7 F (37.1 C) 99.3 F (37.4 C)  TempSrc: Axillary Axillary Axillary Oral  SpO2: 100% 100% 100% 98%    Intake/Output Summary (Last 24 hours) at 12/15/16 1233 Last data filed at 12/15/16 1000  Gross per 24 hour  Intake           3447.5 ml  Output              770 ml  Net           2677.5 ml   There were no vitals filed for this visit.  Examination:  General exam: Elderly female sitting on bed and eating breakfast with the help, nonverbal Respiratory system: Clear bilateral. Respiratory effort normal. No wheezing or crackle Cardiovascular system: Regular rate rhythm, S1-S2 normal. Gastrointestinal system: Abdomen is nondistended, soft and nontender. Normal bowel sounds heard. Central nervous system: Alert and awake but nonverbal Skin: No rashes, lesions or ulcers Psychiatry: unable to assess    Data Reviewed: I have personally reviewed following labs and imaging studies  CBC:  Recent Labs Lab 12/13/16 1824 12/14/16 0433 12/15/16 0637 12/15/16 1134  WBC 11.8* 6.8 8.2  --   NEUTROABS 10.4* 4.9  --   --  HGB 10.3* 9.7* 7.3* 7.8*  HCT 31.1* 30.0* 22.0* 23.9*  MCV 91.5 91.5 90.5  --   PLT 318 271 254  --    Basic Metabolic Panel:  Recent Labs Lab 12/13/16 1824 12/14/16 0433 12/15/16 0637  NA 135 138 137  K 4.0 4.2 4.2  CL 100* 101 104  CO2 GLUCOSE 118* 110* 136*  BUN CREATININE 0.81 0.81 0.87  CALCIUM 9.0 9.0 8.4*    GFR: CrCl cannot be calculated (Unknown ideal weight.). Liver Function Tests:  Recent Labs Lab 12/13/16 1824 12/14/16 0433  AST 17 18  ALT 12* 12*  ALKPHOS 85 74  BILITOT 0.4 0.5  PROT 6.2* 6.0*  ALBUMIN 3.4* 3.3*   No results for input(s): LIPASE, AMYLASE in the last 168 hours. No results for input(s): AMMONIA in the last 168 hours. Coagulation Profile: No results for input(s): INR, PROTIME in the last 168 hours. Cardiac Enzymes: No results for input(s): CKTOTAL, CKMB, CKMBINDEX, TROPONINI in the last 168 hours. BNP (last 3 results) No results for input(s): PROBNP in the last 8760 hours. HbA1C: No results for input(s): HGBA1C in the last 72 hours. CBG: No results for input(s): GLUCAP in the last 168 hours. Lipid Profile: No results for input(s): CHOL, HDL, LDLCALC, TRIG, CHOLHDL, LDLDIRECT in the last 72 hours. Thyroid Function Tests: No results for input(s): TSH, T4TOTAL, FREET4, T3FREE, THYROIDAB in the last 72 hours. Anemia Panel: No results for input(s): VITAMINB12, FOLATE, FERRITIN, TIBC, IRON, RETICCTPCT in the last 72 hours. Sepsis Labs: No results for input(s): PROCALCITON, LATICACIDVEN in the last 168 hours.  Recent Results (from the past 240 hour(s))  Urine culture     Status: Abnormal (Preliminary result)   Collection Time: 12/13/16  8:00 PM  Result Value Ref Range Status   Specimen Description URINE, CATHETERIZED  Final   Special Requests NONE  Final   Culture >=100,000 COLONIES/mL GRAM NEGATIVE RODS (A)  Final   Report Status PENDING  Incomplete         Radiology Studies: Dg Chest 1 View  Result Date: 12/13/2016 CLINICAL DATA:  Recent fall EXAM: CHEST 1 VIEW COMPARISON:  02/25/2015 FINDINGS: Cardiac shadow is mildly prominent but accentuated by the frontal technique. Aortic calcifications are again seen. The lungs are well aerated bilaterally. Large skin fold is noted laterally on the right. No pneumothorax is seen. No focal infiltrate is noted.  No acute rib abnormality is seen. Chronic changes in the right eighth rib are seen posteriorly. T9 compression deformity is noted which is new from the prior exam but of uncertain chronicity. IMPRESSION: T9 compression deformity of uncertain chronicity. Chronic changes without other acute abnormality. Electronically Signed   By: Alcide Clever M.D.   On: 12/13/2016 19:41   Dg Pelvis 1-2 Views  Result Date: 12/13/2016 CLINICAL DATA:  Recent fall with pelvic pain, initial encounter EXAM: PELVIS - 1-2 VIEW COMPARISON:  None. FINDINGS: The pelvic ring is intact. Right hip prosthesis is noted. No soft tissue abnormality is seen. IMPRESSION: No acute abnormality noted. Electronically Signed   By: Alcide Clever M.D.   On: 12/13/2016 19:45   Ct Head Wo Contrast  Result Date: 12/13/2016 CLINICAL DATA:  Fall.  Confusion. EXAM: CT HEAD WITHOUT CONTRAST CT MAXILLOFACIAL WITHOUT CONTRAST CT CERVICAL SPINE WITHOUT CONTRAST TECHNIQUE: Multidetector CT imaging of the head, cervical spine, and maxillofacial structures were performed using the standard protocol without intravenous contrast. Multiplanar CT image reconstructions of the cervical spine and  maxillofacial structures were also generated. COMPARISON:  07/27/2014 head CT. FINDINGS: CT HEAD FINDINGS Brain: No evidence of parenchymal hemorrhage or extra-axial fluid collection. No mass lesion, mass effect, or midline shift. No CT evidence of acute infarction. Generalized cerebral volume loss. Nonspecific moderate subcortical and periventricular white matter hypodensity, most in keeping with chronic small vessel ischemic change. No ventriculomegaly. Vascular: Intracranial atherosclerosis.  No acute abnormality. Skull: No evidence of calvarial fracture. Sinuses/Orbits: No fluid levels. Mucoperiosteal thickening in the bilateral ethmoidal air cells and bilateral maxillary sinuses. Other:  The mastoid air cells are unopacified. CT MAXILLOFACIAL FINDINGS Motion degraded scan.  Osseous: No fracture or mandibular dislocation. No destructive process. Orbits: Negative. No traumatic or inflammatory finding. Sinuses: No fluid levels. Mucoperiosteal thickening in the bilateral ethmoidal air cells and bilateral maxillary sinuses. Soft tissues: Asymmetric right cheek contusion. Otherwise no acute abnormality. CT CERVICAL SPINE FINDINGS Motion degraded scan. Alignment: Straightening of the cervical spine. No subluxation. Dens is well positioned between the lateral masses of C1. Skull base and vertebrae: No acute fracture. No primary bone lesion or focal pathologic process. Soft tissues and spinal canal: No prevertebral fluid or swelling. No visible canal hematoma. Disc levels: Mild degenerative disc disease throughout the cervical spine. Moderate facet arthropathy bilaterally in the cervical spine . No significant degenerative foraminal stenosis. Upper chest: Negative. Other: Visualized mastoid air cells appear clear. No discrete thyroid nodules. No pathologically enlarged cervical nodes. IMPRESSION: 1. No evidence of acute intracranial abnormality. No evidence of calvarial fracture . 2. Moderate chronic small vessel ischemia and generalized cerebral volume loss. 3. Mild right cheek contusion.  No maxillofacial fracture. 4. Chronic paranasal sinusitis. 5. No cervical spine fracture or subluxation. 6. Mild-to-moderate degenerative changes in the cervical spine as detailed . Electronically Signed   By: Delbert Phenix M.D.   On: 12/13/2016 19:56   Ct Cervical Spine Wo Contrast  Result Date: 12/13/2016 CLINICAL DATA:  Fall.  Confusion. EXAM: CT HEAD WITHOUT CONTRAST CT MAXILLOFACIAL WITHOUT CONTRAST CT CERVICAL SPINE WITHOUT CONTRAST TECHNIQUE: Multidetector CT imaging of the head, cervical spine, and maxillofacial structures were performed using the standard protocol without intravenous contrast. Multiplanar CT image reconstructions of the cervical spine and maxillofacial structures were also  generated. COMPARISON:  07/27/2014 head CT. FINDINGS: CT HEAD FINDINGS Brain: No evidence of parenchymal hemorrhage or extra-axial fluid collection. No mass lesion, mass effect, or midline shift. No CT evidence of acute infarction. Generalized cerebral volume loss. Nonspecific moderate subcortical and periventricular white matter hypodensity, most in keeping with chronic small vessel ischemic change. No ventriculomegaly. Vascular: Intracranial atherosclerosis.  No acute abnormality. Skull: No evidence of calvarial fracture. Sinuses/Orbits: No fluid levels. Mucoperiosteal thickening in the bilateral ethmoidal air cells and bilateral maxillary sinuses. Other:  The mastoid air cells are unopacified. CT MAXILLOFACIAL FINDINGS Motion degraded scan. Osseous: No fracture or mandibular dislocation. No destructive process. Orbits: Negative. No traumatic or inflammatory finding. Sinuses: No fluid levels. Mucoperiosteal thickening in the bilateral ethmoidal air cells and bilateral maxillary sinuses. Soft tissues: Asymmetric right cheek contusion. Otherwise no acute abnormality. CT CERVICAL SPINE FINDINGS Motion degraded scan. Alignment: Straightening of the cervical spine. No subluxation. Dens is well positioned between the lateral masses of C1. Skull base and vertebrae: No acute fracture. No primary bone lesion or focal pathologic process. Soft tissues and spinal canal: No prevertebral fluid or swelling. No visible canal hematoma. Disc levels: Mild degenerative disc disease throughout the cervical spine. Moderate facet arthropathy bilaterally in the cervical spine . No significant degenerative foraminal stenosis.  Upper chest: Negative. Other: Visualized mastoid air cells appear clear. No discrete thyroid nodules. No pathologically enlarged cervical nodes. IMPRESSION: 1. No evidence of acute intracranial abnormality. No evidence of calvarial fracture . 2. Moderate chronic small vessel ischemia and generalized cerebral volume  loss. 3. Mild right cheek contusion.  No maxillofacial fracture. 4. Chronic paranasal sinusitis. 5. No cervical spine fracture or subluxation. 6. Mild-to-moderate degenerative changes in the cervical spine as detailed . Electronically Signed   By: Delbert Phenix M.D.   On: 12/13/2016 19:56   Dg Pelvis Portable  Result Date: 12/14/2016 CLINICAL DATA:  Right hip hemiarthroplasty revision, with cerclage wire placement. Initial encounter. EXAM: PORTABLE PELVIS 1-2 VIEWS COMPARISON:  CT of the right hip performed 12/13/2016 FINDINGS: The patient is status post right hip hemiarthroplasty revision, with placement of cerclage wires, transfixing the periprosthetic fracture in grossly anatomic alignment. No new fractures are seen. Overlying skin staples are seen, with scattered soft tissue air. The visualized bowel gas pattern is grossly unremarkable. The left hip joint is unremarkable in appearance. IMPRESSION: Status post right hip hemiarthroplasty revision, with placement of cerclage wires, transfixing the periprosthetic fracture in grossly anatomic alignment. Electronically Signed   By: Roanna Raider M.D.   On: 12/14/2016 16:37   Ct Hip Right Wo Contrast  Result Date: 12/13/2016 CLINICAL DATA:  Right hip pain after a fall. Unable to bear weight. Plain radiograph shows acute proximal right femoral fracture. Dementia. EXAM: CT OF THE RIGHT HIP WITHOUT CONTRAST TECHNIQUE: Multidetector CT imaging of the right hip was performed according to the standard protocol. Multiplanar CT image reconstructions were also generated. COMPARISON:  Pelvic and right femoral radiographs 12/14/2006 FINDINGS: Bones/Joint/Cartilage Postoperative changes with right hip hemiarthroplasty. Non cemented femoral component. There is an acute appearing. Prosthetic fracture extending from the distal tip of the proxy cysts proximally. Cerclage wires surround the inter trochanteric region with nondisplaced fracture of the greater trochanter, possibly  acute. No significant displacement of fractures. No dislocation of the hip joint. Ligaments Suboptimally assessed by CT. Muscles and Tendons Mild fullness in loss of fat planes in the anterior compartment musculature of the right thigh likely representing intramuscular hematoma. Soft tissues Vascular calcifications are present. Small lipoma demonstrated adjacent to the superior acetabulum laterally. Calcifications in the aorta and iliac arteries. Visualized pelvic bowel loops are not distended. No mass demonstrated in the pelvis although visualization is limited by streak artifact from the right hip arthroplasty. IMPRESSION: Right hip hemiarthroplasty with periprosthetic nondisplaced acute fractures. Probable associated intramuscular hematoma in the anterior compartment. Electronically Signed   By: Burman Nieves M.D.   On: 12/13/2016 23:49   Ct Femur Right Wo Contrast  Result Date: 12/13/2016 CLINICAL DATA:  Right hip pain after a fall. Unable to bear weight. Plain radiograph shows acute proximal right femoral fracture. Dementia. EXAM: CT OF THE RIGHT HIP WITHOUT CONTRAST TECHNIQUE: Multidetector CT imaging of the right hip was performed according to the standard protocol. Multiplanar CT image reconstructions were also generated. COMPARISON:  Pelvic and right femoral radiographs 12/14/2006 FINDINGS: Bones/Joint/Cartilage Postoperative changes with right hip hemiarthroplasty. Non cemented femoral component. There is an acute appearing. Prosthetic fracture extending from the distal tip of the proxy cysts proximally. Cerclage wires surround the inter trochanteric region with nondisplaced fracture of the greater trochanter, possibly acute. No significant displacement of fractures. No dislocation of the hip joint. Ligaments Suboptimally assessed by CT. Muscles and Tendons Mild fullness in loss of fat planes in the anterior compartment musculature of the right thigh likely  representing intramuscular hematoma. Soft  tissues Vascular calcifications are present. Small lipoma demonstrated adjacent to the superior acetabulum laterally. Calcifications in the aorta and iliac arteries. Visualized pelvic bowel loops are not distended. No mass demonstrated in the pelvis although visualization is limited by streak artifact from the right hip arthroplasty. IMPRESSION: Right hip hemiarthroplasty with periprosthetic nondisplaced acute fractures. Probable associated intramuscular hematoma in the anterior compartment. Electronically Signed   By: Burman Nieves M.D.   On: 12/13/2016 23:49   Dg C-arm 1-60 Min  Result Date: 12/14/2016 CLINICAL DATA:  Right hip hemiarthroplasty revision with cable placement. Fluoro time reported is 7 seconds. EXAM: DG C-ARM 61-120 MIN; OPERATIVE RIGHT HIP WITH PELVIS COMPARISON:  Preoperative CT scan through the right hip and femur FINDINGS: 3 fluoro spot images reveal the stem portion of the prosthesis. A fracture line in the native bone is noted along the medial aspect of the proximal third of the femur. Four metallic cables are placed circumferentially around the proximal humeral shaft. The fracture line extends below the lower most of the wire cables. IMPRESSION: 3 fluoro spot images from the operating room revealing placement of for wire cables around the fractured proximal femoral shaft. The inferior aspect of fracture line extends below the lowermost cable. Electronically Signed   By: David  [place] M.D.   On: 12/14/2016 15:10   Dg Hip Operative Unilat W Or W/o Pelvis Right  Result Date: 12/14/2016 CLINICAL DATA:  Right hip hemiarthroplasty revision with cable placement. Fluoro time reported is 7 seconds. EXAM: DG C-ARM 61-120 MIN; OPERATIVE RIGHT HIP WITH PELVIS COMPARISON:  Preoperative CT scan through the right hip and femur FINDINGS: 3 fluoro spot images reveal the stem portion of the prosthesis. A fracture line in the native bone is noted along the medial aspect of the proximal third of the  femur. Four metallic cables are placed circumferentially around the proximal humeral shaft. The fracture line extends below the lower most of the wire cables. IMPRESSION: 3 fluoro spot images from the operating room revealing placement of for wire cables around the fractured proximal femoral shaft. The inferior aspect of fracture line extends below the lowermost cable. Electronically Signed   By: David  [place] M.D.   On: 12/14/2016 15:10   Dg Femur Min 2 Views Left  Result Date: 12/13/2016 CLINICAL DATA:  Recent fall with left leg pain, initial encounter EXAM: LEFT FEMUR 2 VIEWS COMPARISON:  None FINDINGS: No acute fracture or dislocation is noted. Diffuse vascular calcifications are seen. The visualized pelvis is within normal limits. No soft tissue abnormality is noted. IMPRESSION: No acute abnormality seen. Electronically Signed   By: Alcide Clever M.D.   On: 12/13/2016 19:44   Dg Femur Min 2 Views Right  Result Date: 12/13/2016 CLINICAL DATA:  Recent fall with right leg pain, initial encounter EXAM: RIGHT FEMUR 2 VIEWS COMPARISON:  None. FINDINGS: Right hip prosthesis is noted. It appears well seated within the acetabulum. No acute fracture or dislocation is noted. Diffuse vascular calcifications are seen. The visualized pelvis is within normal limits. IMPRESSION: No acute abnormality noted. Electronically Signed   By: Alcide Clever M.D.   On: 12/13/2016 19:41   Ct Maxillofacial Wo Contrast  Result Date: 12/13/2016 CLINICAL DATA:  Fall.  Confusion. EXAM: CT HEAD WITHOUT CONTRAST CT MAXILLOFACIAL WITHOUT CONTRAST CT CERVICAL SPINE WITHOUT CONTRAST TECHNIQUE: Multidetector CT imaging of the head, cervical spine, and maxillofacial structures were performed using the standard protocol without intravenous contrast. Multiplanar CT image reconstructions of the cervical  spine and maxillofacial structures were also generated. COMPARISON:  07/27/2014 head CT. FINDINGS: CT HEAD FINDINGS Brain: No evidence of  parenchymal hemorrhage or extra-axial fluid collection. No mass lesion, mass effect, or midline shift. No CT evidence of acute infarction. Generalized cerebral volume loss. Nonspecific moderate subcortical and periventricular white matter hypodensity, most in keeping with chronic small vessel ischemic change. No ventriculomegaly. Vascular: Intracranial atherosclerosis.  No acute abnormality. Skull: No evidence of calvarial fracture. Sinuses/Orbits: No fluid levels. Mucoperiosteal thickening in the bilateral ethmoidal air cells and bilateral maxillary sinuses. Other:  The mastoid air cells are unopacified. CT MAXILLOFACIAL FINDINGS Motion degraded scan. Osseous: No fracture or mandibular dislocation. No destructive process. Orbits: Negative. No traumatic or inflammatory finding. Sinuses: No fluid levels. Mucoperiosteal thickening in the bilateral ethmoidal air cells and bilateral maxillary sinuses. Soft tissues: Asymmetric right cheek contusion. Otherwise no acute abnormality. CT CERVICAL SPINE FINDINGS Motion degraded scan. Alignment: Straightening of the cervical spine. No subluxation. Dens is well positioned between the lateral masses of C1. Skull base and vertebrae: No acute fracture. No primary bone lesion or focal pathologic process. Soft tissues and spinal canal: No prevertebral fluid or swelling. No visible canal hematoma. Disc levels: Mild degenerative disc disease throughout the cervical spine. Moderate facet arthropathy bilaterally in the cervical spine . No significant degenerative foraminal stenosis. Upper chest: Negative. Other: Visualized mastoid air cells appear clear. No discrete thyroid nodules. No pathologically enlarged cervical nodes. IMPRESSION: 1. No evidence of acute intracranial abnormality. No evidence of calvarial fracture . 2. Moderate chronic small vessel ischemia and generalized cerebral volume loss. 3. Mild right cheek contusion.  No maxillofacial fracture. 4. Chronic paranasal  sinusitis. 5. No cervical spine fracture or subluxation. 6. Mild-to-moderate degenerative changes in the cervical spine as detailed . Electronically Signed   By: Delbert Phenix M.D.   On: 12/13/2016 19:56        Scheduled Meds: . aspirin EC  325 mg Oral Q breakfast  . docusate sodium  100 mg Oral BID  . feeding supplement (ENSURE ENLIVE)  237 mL Oral BID BM   Continuous Infusions: . dextrose 5 % and 0.9 % NaCl with KCl 20 mEq/L 75 mL/hr (12/14/16 2054)  . famotidine (PEPCID) IV    . lactated ringers 50 mL/hr at 12/14/16 1232     LOS: 1 day    Yaritzi Craun Jaynie Collins, MD [facility] Pager [phone]  If 7PM-7AM, please contact night-coverage [email or web address] Password [facility] 12/15/2016, 12:33 PM

## 2016-12-15 NOTE — Progress Notes (Signed)
PT Cancellation Note  Patient Details Name: Jill [name] MRN: [id number] DOB: 05/19/1929   Cancelled Treatment:    Reason Eval/Treat Not Completed: Other (comment) (Waiting on clarification of prior bed rest order. )   Elon Alas Fleet 12/15/2016, 11:51 AM  Courtney Paris. Beverely Risen PT, DPT Acute Rehabilitation  [phone] Pager [phone]

## 2016-12-16 ENCOUNTER — Encounter (HOSPITAL_COMMUNITY): Payer: Self-pay | Admitting: General Practice

## 2016-12-16 DIAGNOSIS — Z9289 Personal history of other medical treatment: Secondary | ICD-10-CM

## 2016-12-16 HISTORY — DX: Personal history of other medical treatment: Z92.89

## 2016-12-16 LAB — CBC
HCT: 29.8 % — ABNORMAL LOW (ref 36.0–46.0)
HEMATOCRIT: 21.1 % — AB (ref 36.0–46.0)
HEMOGLOBIN: 9.7 g/dL — AB (ref 12.0–15.0)
Hemoglobin: 6.9 g/dL — CL (ref 12.0–15.0)
MCH: 30.1 pg (ref 26.0–34.0)
MCH: 29.6 pg (ref 26.0–34.0)
MCHC: 32.7 g/dL (ref 30.0–36.0)
MCHC: 32.6 g/dL (ref 30.0–36.0)
MCV: 92.1 fL (ref 78.0–100.0)
MCV: 90.9 fL (ref 78.0–100.0)
PLATELETS: 255 10*3/uL (ref 150–400)
Platelets: 239 10*3/uL (ref 150–400)
RBC: 2.29 MIL/uL — AB (ref 3.87–5.11)
RBC: 3.28 MIL/uL — ABNORMAL LOW (ref 3.87–5.11)
RDW: 13.5 % (ref 11.5–15.5)
RDW: 13.5 % (ref 11.5–15.5)
WBC: 8.4 10*3/uL (ref 4.0–10.5)
WBC: 10.3 10*3/uL (ref 4.0–10.5)

## 2016-12-16 LAB — PREPARE RBC (CROSSMATCH)

## 2016-12-16 LAB — URINE CULTURE: Culture: >=100000 — AB

## 2016-12-16 LAB — MRSA PCR SCREENING: MRSA BY PCR: NEGATIVE

## 2016-12-16 MED ORDER — POLYETHYLENE GLYCOL 3350 17 G PO PACK
17.0000 g | PACK | Freq: Every day | ORAL | Status: DC
  Administered 2016-12-16 – 2016-12-17 (×2): 17 g via ORAL
  Filled 2016-12-16 (×2): qty 1

## 2016-12-16 MED ORDER — SODIUM CHLORIDE 0.9 % IV SOLN
Freq: Once | INTRAVENOUS | Status: AC
  Administered 2016-12-16: 08:00:00 via INTRAVENOUS

## 2016-12-16 MED ORDER — FLEET ENEMA 7-19 GM/118ML RE ENEM: 1.0000 | ENEMA | Freq: Every day | RECTAL | Status: DC | PRN

## 2016-12-16 MED ORDER — CIPROFLOXACIN HCL 500 MG PO TABS
500.0000 mg | ORAL_TABLET | Freq: Two times a day (BID) | ORAL | Status: DC
  Administered 2016-12-16 – 2016-12-17 (×2): 500 mg via ORAL
  Filled 2016-12-16 (×2): qty 1

## 2016-12-16 NOTE — [place] FL2 (Signed)
[place] MEDICAID FL2 LEVEL OF CARE SCREENING TOOL     IDENTIFICATION  Patient Name: [place] Birthdate: 1929/06/14 Sex: female Admission Date (Current Location): 12/13/2016  [facility] and [place] Number:  Producer, television/film/video and Address:  [facility], [address] [place], [place] [address]      Provider Number: [id number]  Attending Physician Name and Address:  Osvaldo Shipper, MD  Relative Name and Phone Number:       Current Level of Care: Hospital Recommended Level of Care: Skilled Nursing Facility Prior Approval Number:    Date Approved/Denied:  12/16/16 PASRR Number:  [id number]  Discharge Plan: SNF    Current Diagnoses: Patient Active Problem List   Diagnosis Date Noted  . Hip fracture (HCC) 12/14/2016  . Peri-prosthetic femoral shaft fracture 12/14/2016  . UTI (urinary tract infection) 02/28/2015  . Protein-calorie malnutrition, severe (HCC) 02/27/2015  . Fall   . Postop check   . Fracture of femoral neck, right (HCC) 02/25/2015  . Diastolic dysfunction 02/25/2015  . Hyperlipidemia 02/25/2015  . Dementia, multi-infarct   . TIA (transient ischemic attack) 07/27/2014    Orientation RESPIRATION BLADDER Height & Weight      (Dementia, non verbal)  Normal Incontinent Weight:   Height:     BEHAVIORAL SYMPTOMS/MOOD NEUROLOGICAL BOWEL NUTRITION STATUS      Incontinent  (Please see d/c summary)  AMBULATORY STATUS COMMUNICATION OF NEEDS Skin   Extensive Assist Verbally Surgical wounds (Closed inicision right leg, Hydrocolloid dressing. Closed incision right hip )                       Personal Care Assistance Level of Assistance  Bathing, Feeding, Dressing Bathing Assistance: Maximum assistance Feeding assistance: Limited assistance Dressing Assistance: Maximum assistance     Functional Limitations Info  Sight, Hearing, Speech Sight Info: Adequate Hearing Info: Adequate Speech Info: Impaired (Non verbal)     SPECIAL CARE FACTORS FREQUENCY  PT (By licensed PT), OT (By licensed OT)     PT Frequency: 5x/week OT Frequency: 5x/week            Contractures Contractures Info: Not present    Additional Factors Info  Code Status, Allergies Code Status Info: DNR Allergies Info: Penicillins           Current Medications (12/16/2016):  This is the current hospital active medication list Current Facility-Administered Medications  Medication Dose Route Frequency Provider Last Rate Last Dose  . 0.9 %  sodium chloride infusion   Intravenous Once Leanne Chang, NP      . acetaminophen (TYLENOL) tablet 650 mg  650 mg Oral Q6H PRN Naida Sleight, PA-C       Or  . acetaminophen (TYLENOL) suppository 650 mg  650 mg Rectal Q6H PRN Naida Sleight, PA-C      . aspirin EC tablet 325 mg  325 mg Oral Q breakfast Naida Sleight, PA-C   325 mg at 12/15/16 0931  . docusate sodium (COLACE) capsule 100 mg  100 mg Oral BID Naida Sleight, PA-C   100 mg at 12/15/16 2305  . famotidine (PEPCID) tablet 20 mg  20 mg Oral Daily Darl Householder Masters, [facility]      . feeding supplement (ENSURE ENLIVE) (ENSURE ENLIVE) liquid 237 mL  237 mL Oral BID BM Dron Jaynie Collins, MD   237 mL at 12/15/16 1404  . HYDROcodone-acetaminophen (NORCO/VICODIN) 5-325 MG per tablet 1 tablet  1 tablet Oral  Q6H PRN Naida Sleight, PA-C   1 tablet at 12/15/16 1723  . lactated ringers infusion   Intravenous Continuous Eldred Manges, MD 50 mL/hr at 12/14/16 1232    . menthol-cetylpyridinium (CEPACOL) lozenge 3 mg  1 lozenge Oral PRN Naida Sleight, PA-C       Or  . phenol (CHLORASEPTIC) mouth spray 1 spray  1 spray Mouth/Throat PRN Naida Sleight, PA-C      . metoCLOPramide (REGLAN) tablet 5-10 mg  5-10 mg Oral Q8H PRN Naida Sleight, PA-C       Or  . metoCLOPramide (REGLAN) injection 5-10 mg  5-10 mg Intravenous Q8H PRN Naida Sleight, PA-C      . morphine 2 MG/ML injection 0.5 mg  0.5 mg Intravenous Q2H PRN Eduard Clos, MD   0.5 mg at  12/15/16 1516  . ondansetron (ZOFRAN) tablet 4 mg  4 mg Oral Q6H PRN Naida Sleight, PA-C       Or  . ondansetron [facility]) injection 4 mg  4 mg Intravenous Q6H PRN Naida Sleight, PA-C         Discharge Medications: Please see discharge summary for a list of discharge medications.  Relevant Imaging Results:  Relevant Lab Results:   Additional Information SSN: [id number]  Tresa Moore, LCSW

## 2016-12-16 NOTE — Progress Notes (Signed)
[facility] PROGRESS NOTE  Jill [name] ZOX:[id number] DOB: 1929-04-03 DOA: 12/13/2016  PCP: No PCP Per Patient  Brief History/Interval Summary: 83 y.o.femalewith history of dementia and nonverbal was brought to the ER after patient had an unwitnessed fall at the skilled nursing facility. Patient is nonverbal at baseline and did not provide any history. Exact circumstances of the fall was not known.In the ER, CT scan showed right hip periprosthetic fracture. Orthopedics was consulted and admitted for possible surgery  Reason for Visit: Right hip periprosthetic fracture  Consultants: Orthopedics  Procedures: Right hip hemiarthroplasty revision with placement of the longstem coated press-fit stem with cerclage wire fixation of femur fracture 4/16  Antibiotics: Oral Cipro  Subjective/Interval History: Patient very confused due to her dementia. Unable to answer any questions.  ROS: Unable to do due to her dementia  Objective:  Vital Signs  Vitals:   12/16/16 0559 12/16/16 0800 12/16/16 0839 12/16/16 1224  BP: (!) 150/75 (!) 134/53 137/61 134/60  Pulse: 82 71 70 70  Resp: Temp: 98 F (36.7 C) 99.8 F (37.7 C) 98.8 F (37.1 C) 98.2 F (36.8 C)  TempSrc: Oral Axillary Axillary Axillary  SpO2:  95% 97%     Intake/Output Summary (Last 24 hours) at 12/16/16 1434 Last data filed at 12/16/16 1224  Gross per 24 hour  Intake             2421 ml  Output             1200 ml  Net             1221 ml   There were no vitals filed for this visit.  General appearance: alert, distracted, appears stated age and no distress Resp: clear to auscultation bilaterally Cardio: regular rate and rhythm, S1, S2 normal, no murmur, click, rub or gallop GI: soft, non-tender; bowel sounds normal; no masses,  no organomegaly Extremities: extremities normal, atraumatic, no cyanosis or edema Neurologic: No focal deficits. Disoriented due to her dementia.  Lab  Results:  Data Reviewed: I have personally reviewed following labs and imaging studies  CBC:  Recent Labs Lab 12/13/16 1824 12/14/16 0433 12/15/16 0637 12/15/16 1134 12/16/16 0543  WBC 11.8* 6.8 8.2  --  8.4  NEUTROABS 10.4* 4.9  --   --   --   HGB 10.3* 9.7* 7.3* 7.8* 6.9*  HCT 31.1* 30.0* 22.0* 23.9* 21.1*  MCV 91.5 91.5 90.5  --  92.1  PLT 318 271 254  --  239    Basic Metabolic Panel:  Recent Labs Lab 12/13/16 1824 12/14/16 0433 12/15/16 0637  NA 135 138 137  K 4.0 4.2 4.2  CL 100* 101 104  CO2 GLUCOSE 118* 110* 136*  BUN CREATININE 0.81 0.81 0.87  CALCIUM 9.0 9.0 8.4*    GFR: CrCl cannot be calculated (Unknown ideal weight.).  Liver Function Tests:  Recent Labs Lab 12/13/16 1824 12/14/16 0433  AST 17 18  ALT 12* 12*  ALKPHOS 85 74  BILITOT 0.4 0.5  PROT 6.2* 6.0*  ALBUMIN 3.4* 3.3*     Recent Results (from the past 240 hour(s))  Urine culture     Status: Abnormal   Collection Time: 12/13/16  8:00 PM  Result Value Ref Range Status   Specimen Description URINE, CATHETERIZED  Final   Special Requests NONE  Final   Culture >=100,000 COLONIES/mL KLEBSIELLA PNEUMONIAE (A)  Final   Report Status  12/16/2016 FINAL  Final   Organism ID, Bacteria KLEBSIELLA PNEUMONIAE (A)  Final      Susceptibility   Klebsiella pneumoniae - MIC*    AMPICILLIN RESISTANT Resistant     CEFAZOLIN <=4 SENSITIVE Sensitive     CEFTRIAXONE <=1 SENSITIVE Sensitive     CIPROFLOXACIN <=0.25 SENSITIVE Sensitive     GENTAMICIN <=1 SENSITIVE Sensitive     IMIPENEM <=0.25 SENSITIVE Sensitive     NITROFURANTOIN <=16 SENSITIVE Sensitive     TRIMETH/SULFA <=20 SENSITIVE Sensitive     AMPICILLIN/SULBACTAM <=2 SENSITIVE Sensitive     PIP/TAZO <=4 SENSITIVE Sensitive     Extended ESBL NEGATIVE Sensitive     * >=100,000 COLONIES/mL KLEBSIELLA PNEUMONIAE      Radiology Studies: Dg Pelvis Portable  Result Date: 12/14/2016 CLINICAL DATA:  Right hip  hemiarthroplasty revision, with cerclage wire placement. Initial encounter. EXAM: PORTABLE PELVIS 1-2 VIEWS COMPARISON:  CT of the right hip performed 12/13/2016 FINDINGS: The patient is status post right hip hemiarthroplasty revision, with placement of cerclage wires, transfixing the periprosthetic fracture in grossly anatomic alignment. No new fractures are seen. Overlying skin staples are seen, with scattered soft tissue air. The visualized bowel gas pattern is grossly unremarkable. The left hip joint is unremarkable in appearance. IMPRESSION: Status post right hip hemiarthroplasty revision, with placement of cerclage wires, transfixing the periprosthetic fracture in grossly anatomic alignment. Electronically Signed   By: Roanna Raider M.D.   On: 12/14/2016 16:37     Medications:  Scheduled: . aspirin EC  325 mg Oral Q breakfast  . ciprofloxacin  500 mg Oral BID  . docusate sodium  100 mg Oral BID  . famotidine  20 mg Oral Daily  . feeding supplement (ENSURE ENLIVE)  237 mL Oral BID BM   Continuous: . lactated ringers 50 mL/hr at 12/14/16 1232   ZOX:[id number] **OR** acetaminophen, HYDROcodone-acetaminophen, menthol-cetylpyridinium **OR** phenol, metoCLOPramide **OR** metoCLOPramide (REGLAN) injection, morphine injection, ondansetron **OR** ondansetron (ZOFRAN) IV  Assessment/Plan:  Active Problems:   Dementia, multi-infarct   Protein-calorie malnutrition, severe (HCC)   Fall   UTI (urinary tract infection)   Peri-prosthetic femoral shaft fracture    Right hip fracture/periprosthetic nondisplaced acute fracture As per CT scan, pt may have associated intramuscular hematoma in the anterior compartment. S/p orthopedic surgery on 4/16 which include right hip hemiarthroplasty revision. Continue pain management and supportive care. He will need to go to skilled nursing facility for rehabilitation. CT head with no acute finding.  Acute blood loss anemia Likely in the setting of  surgery and it may also contributed by intramuscular hematoma. Hemoglobin 6.9 today. Transfuse 1 unit of blood. Repeat hemoglobin later today. Repeat tomorrow.   Mild sinus bradycardia Likely contributed by morphine. Continue to monitor. Patient is not on beta blocker. Heart rate improved.  Elevated blood pressure likely contributed by pain Not on antihypertensive medication. Continue to monitor. Blood pressure has improved.  Dementia without behavioral disturbance Mental status appears to be at baseline. Continue supportive care.   UTI, site unspecified UA has no WBC but nitrite is positive. Urine culture growing more than 100,000 Klebsiella. Patient is nonverbal therefore unable to obtain history. Initiate ciprofloxacin orally for a 5 day course.    DVT Prophylaxis: Aspirin    Code Status: DO NOT RESUSCITATE  Family Communication: No family at bedside  Disposition Plan: Management as outlined above. Anticipate discharge to skilled nursing facility tomorrow.    LOS: 2 days   [facility]  [facility] Pager [phone] 12/16/2016, 2:34 PM  If 7PM-7AM, please contact night-coverage at [email or web address], password [email or web address]   

## 2016-12-16 NOTE — Clinical Social Work Note (Signed)
Clinical Social Work Assessment  Patient Details  Name: Jill [name] MRN: [id number] Date of Birth: 1929-07-18  Date of referral:  12/16/16               Reason for consult:  Facility Placement                Permission sought to share information with:  Oceanographer granted to share information::  Yes, Verbal Permission Granted, Yes, Release of Information Signed  Name::        Agency::  SNF  Relationship::     Contact Information:     Housing/Transportation Living arrangements for the past 2 months:  Skilled Nursing Facility Source of Information:  Adult Children Patient Interpreter Needed:  None Criminal Activity/Legal Involvement Pertinent to Current Situation/Hospitalization:  No - Comment as needed Significant Relationships:  Adult Children Lives with:  Adult Children Do you feel safe going back to the place where you live?  No Need for family participation in patient care:  Yes (Comment)  Care giving concerns:  Per daughter, patient was in a facility prior to injury and would want patient to return to a skilled facility due to her immobility and impairment issues. Daughter understands that patient unable to move independently at this time and needs further care.   Social Worker assessment / plan:  CSW contacted daughter to discuss treatment  plan for DC as pt has dementia and is non verbal at this time and CSW will collaborate with daughter on care.  Daughter confirmed that patient has gone to short term rehabilitation in the past and she understands the SNF process. CSW explained next steps in the DC process and daughter agreeable with [place] area for DC placement.  Completed FL2 and sent pt information to area SNFs per daughter's request.  Employment status:  Retired Community education officer information:  Medicare PT Recommendations:  Skilled Nursing Facility Information / Referral to community resources:  Skilled Nursing Facility  Patient/Family's  Response to care:  Daughter responded positively to plan for SNF search in the [place] area and is agreeable.  No concerns identified at this time.  Patient/Family's Understanding of and Emotional Response to Diagnosis, Current Treatment, and Prognosis: Daughter demonstrates adequate understanding of pt's treatment and plan. Daughter expressed appreciation for hospital staff support and is positive pt will receive care she needs following plan above. No issues reported at this time.   Emotional Assessment Appearance:  Other (Comment Required Attitude/Demeanor/Rapport:  Unable to Assess Affect (typically observed):  Unable to Assess Orientation:    Alcohol / Substance use:  Not Applicable Psych involvement (Current and /or in the community):  No (Comment)  Discharge Needs  Concerns to be addressed:  Care Coordination Readmission within the last 30 days:  No Current discharge risk:  Physical Impairment Barriers to Discharge:  Continued Medical Work up   [employer], LCSW 12/16/2016, 10:50 AM

## 2016-12-16 NOTE — Progress Notes (Signed)
CRITICAL VALUE ALERT  Critical value received:  Hgb 6.9  Date of notification:  12/16/16  Time of notification:  0654  Critical value read back:yes  Nurse who received alert:  Wendelyn Breslow, RN  MD notified (1st page):  Kirtland Bouchard Schorr  Time of first page:  0654  MD notified (2nd page):  Time of second page:  Responding MD:  Merdis Delay  Time MD responded:  [phone]

## 2016-12-16 NOTE — Progress Notes (Signed)
Pt pulled IV out. MD aware and orders to not replace given. Pt back to bed with tech assistance.

## 2016-12-16 NOTE — Progress Notes (Signed)
Physical Therapy Treatment Patient Details Name: Jill [name] MRN: [id number] DOB: [birth year] Today's Date: 12/16/2016    History of Present Illness Pt is a 83 yo female admitted with R hip pain experienced after a fall. Pt s/p R hip hemiarthroplasty revison with cable placement. Pt has dementia and is nonverbal     PT Comments    Pt making slow progress towards goals and continues to be limited by R hip pain. Pt is maxAx2 for bed mobility and transfers to recliner. Pt able to initiate mobility but stops assistance with pain making her maxAx2. Pt requires skilled PT to continue bed mobility and transfer training and to maintain strength to improve her mobility in her discharge environment.   Follow Up Recommendations  SNF     Equipment Recommendations  None recommended by PT    Recommendations for Other Services OT consult     Precautions / Restrictions Precautions Precautions: Posterior Hip Precaution Booklet Issued: Yes (comment) Precaution Comments: daughter educated on posterior hip precautions Required Braces or Orthoses: Knee Immobilizer - Right Knee Immobilizer - Right: On at all times Restrictions Weight Bearing Restrictions: Yes RLE Weight Bearing: Weight bearing as tolerated    Mobility  Bed Mobility Overal bed mobility: Needs Assistance Bed Mobility: Supine to Sit     Supine to sit: +2 for physical assistance;HOB elevated;Max assist     General bed mobility comments: pt able to initiate movement but stopped when hip became painful   Transfers Overall transfer level: Needs assistance Equipment used: None Transfers: Stand Pivot Transfers   Stand pivot transfers: Max assist;+2 physical assistance       General transfer comment: Pt able to come all the way to standing with modAx2 but became maxAx2 when she weightshifted through her R LE.         Balance Overall balance assessment: Needs assistance Sitting-balance support: Feet supported;Bilateral  upper extremity supported Sitting balance-Leahy Scale: Zero Sitting balance - Comments: requires modA to correct posterior lean Postural control: Posterior lean                                  Cognition Arousal/Alertness: Awake/alert Behavior During Therapy: WFL for tasks assessed/performed Overall Cognitive Status: History of cognitive impairments - at baseline                                 General Comments: pt has Alzheimer's with associated dementia      Exercises      General Comments General comments (skin integrity, edema, etc.): daughter in room      Pertinent Vitals/Pain Pain Assessment: Faces Faces Pain Scale: Hurts a little bit Pain Location: Rhip Pain Descriptors / Indicators: Grimacing;Guarding Pain Intervention(s): Repositioned;Monitored during session  VSS    Home Living Family/patient expects to be discharged to:: Unsure                        PT Goals (current goals can now be found in the care plan section) Acute Rehab PT Goals PT Goal Formulation: With patient/family Time For Goal Achievement: 12/22/16 Potential to Achieve Goals: Fair Progress towards PT goals: Progressing toward goals    Frequency    Min 3X/week      PT Plan Current plan remains appropriate       End of Session Equipment Utilized During Treatment: Right knee  immobilizer;Gait belt Activity Tolerance: Patient limited by pain Patient left: in chair;with call bell/phone within reach;with chair alarm set;with family/visitor present Nurse Communication: Mobility status (check IV) PT Visit Diagnosis: Unsteadiness on feet (R26.81);Other abnormalities of gait and mobility (R26.89);Pain;Other symptoms and signs involving the nervous system (R29.898);Repeated falls (R29.6);History of falling (Z91.81) Pain - Right/Left: Right Pain - part of body: Hip     Time: 4098-1191 PT Time Calculation (min) (ACUTE ONLY): 20 min  Charges:   $Therapeutic Activity: 8-22 mins                    G Codes:       Jill [name] PT, DPT Acute Rehabilitation  [phone] Pager [phone]     Jill [name] [facility] 12/16/2016, 3:43 PM

## 2016-12-16 NOTE — Progress Notes (Signed)
Subjective: Patient resting comfortably.  Medicine team ordered transfusion PRBC's for Hgb 6.9.      Objective: Vital signs in last 24 hours: Temp:  [98 F (36.7 C)-99.8 F (37.7 C)] 98.8 F (37.1 C) (04/18 0839) Pulse Rate:  [70-107] 70 (04/18 0839) Resp:  [16] 16 (04/18 0839) BP: (128-150)/(53-85) 137/61 (04/18 0839) SpO2:  [90 %-98 %] 97 % (04/18 0839)  Intake/Output from previous day: 04/17 0701 - 04/18 0700 In: 1516 [P.O.:336; I.V.:1180] Out: 800 [Urine:800] Intake/Output this shift: Total I/O In: 670 [Blood:670] Out: -    Recent Labs  12/13/16 1824 12/14/16 0433 12/15/16 0637 12/15/16 1134 12/16/16 0543  HGB 10.3* 9.7* 7.3* 7.8* 6.9*    Recent Labs  12/15/16 0637 12/15/16 1134 12/16/16 0543  WBC 8.2  --  8.4  RBC 2.43*  --  2.29*  HCT 22.0* 23.9* 21.1*  PLT 254  --  239    Recent Labs  12/14/16 0433 12/15/16 0637  NA 138 137  K 4.2 4.2  CL 101 104  CO2 30 26  BUN 13 13  CREATININE 0.81 0.87  GLUCOSE 110* 136*  CALCIUM 9.0 8.4*   No results for input(s): LABPT, INR in the last 72 hours.  Exam:  NAD.  Right hip wound looks good.  Staples intact.  No drainage or signs of infection.  NVI.   Assessment/Plan: Transfusion today.  Dressing change right hip.  Stable from ortho standpoint.    Zonia Kief 12/16/2016, 8:53 AM

## 2016-12-17 LAB — BPAM RBC
BLOOD PRODUCT EXPIRATION DATE: 201805132359
ISSUE DATE / TIME: 201804180812
UNIT TYPE AND RH: 9500

## 2016-12-17 LAB — TYPE AND SCREEN
ABO/RH(D): O NEG
ANTIBODY SCREEN: NEGATIVE
UNIT DIVISION: 0

## 2016-12-17 LAB — CBC
HCT: 28.3 % — ABNORMAL LOW (ref 36.0–46.0)
HEMOGLOBIN: 9.2 g/dL — AB (ref 12.0–15.0)
MCH: 29.8 pg (ref 26.0–34.0)
MCHC: 32.5 g/dL (ref 30.0–36.0)
MCV: 91.6 fL (ref 78.0–100.0)
PLATELETS: 256 10*3/uL (ref 150–400)
RBC: 3.09 MIL/uL — ABNORMAL LOW (ref 3.87–5.11)
RDW: 13.7 % (ref 11.5–15.5)
WBC: 9 10*3/uL (ref 4.0–10.5)

## 2016-12-17 MED ORDER — FLEET ENEMA 7-19 GM/118ML RE ENEM: 1.0000 | ENEMA | Freq: Every day | RECTAL | 0 refills | Status: DC | PRN

## 2016-12-17 MED ORDER — HYDROCODONE-ACETAMINOPHEN 5-325 MG PO TABS
1.0000 | ORAL_TABLET | Freq: Four times a day (QID) | ORAL | 0 refills | Status: DC | PRN
Start: 2016-12-17 — End: 2016-12-25

## 2016-12-17 MED ORDER — ENSURE ENLIVE PO LIQD: 237.0000 mL | Freq: Two times a day (BID) | ORAL | 12 refills | Status: DC

## 2016-12-17 MED ORDER — TRAZODONE HCL 50 MG PO TABS: 50.0000 mg | ORAL_TABLET | Freq: Every evening | ORAL | 0 refills | Status: DC | PRN

## 2016-12-17 MED ORDER — ASPIRIN 325 MG PO TBEC: 325.0000 mg | DELAYED_RELEASE_TABLET | Freq: Every day | ORAL | 0 refills | Status: DC

## 2016-12-17 MED ORDER — DOCUSATE SODIUM 100 MG PO CAPS: 100.0000 mg | ORAL_CAPSULE | Freq: Two times a day (BID) | ORAL | 0 refills | Status: DC

## 2016-12-17 MED ORDER — CIPROFLOXACIN HCL 500 MG PO TABS: 500.0000 mg | ORAL_TABLET | Freq: Two times a day (BID) | ORAL | 0 refills | Status: AC

## 2016-12-17 MED ORDER — ACETAMINOPHEN 325 MG PO TABS: 650.0000 mg | ORAL_TABLET | Freq: Four times a day (QID) | ORAL | Status: DC | PRN

## 2016-12-17 MED ORDER — ORAL CARE MOUTH RINSE
15.0000 mL | Freq: Two times a day (BID) | OROMUCOSAL | Status: DC
  Administered 2016-12-17: 15 mL via OROMUCOSAL

## 2016-12-17 MED ORDER — CLONAZEPAM 0.5 MG PO TABS: 0.5000 mg | ORAL_TABLET | Freq: Two times a day (BID) | ORAL | 0 refills | Status: DC | PRN

## 2016-12-17 NOTE — Discharge Summary (Addendum)
[facility]  Physician Discharge Summary   Patient ID: Jill [name] MRN: [id number] DOB/AGE: Mar 06, 1929 83 y.o.  Admit date: 12/13/2016 Discharge date: 12/17/2016  PCP: Marcell Anger, NP  DISCHARGE DIAGNOSES:  Active Problems:   Dementia, multi-infarct   Protein-calorie malnutrition, severe (HCC)   Fall   UTI (urinary tract infection)   Peri-prosthetic femoral shaft fracture   RECOMMENDATIONS FOR OUTPATIENT FOLLOW UP: 1. CBC and basic metabolic panel in 1 week   DISCHARGE CONDITION: fair  Diet recommendation: Regular as tolerated  INITIAL HISTORY: 83 y.o.femalewith history of dementia and nonverbal was brought to the ER after patient had an unwitnessed fall at the skilled nursing facility. Patient is nonverbal at baseline and did not provide any history. Exact circumstances of the fall was not known.In the ER,CT scan showed right hip periprosthetic fracture. Orthopedics was consulted and admitted for possible surgery  Consultations:  Dr. Ophelia Charter with orthopedics  Procedures: Right hip hemiarthroplasty revision with placement of the longstem coated press-fit stem with cerclage wire fixation of femur fracture 4/16   HOSPITAL COURSE:   Right hip fracture/periprosthetic nondisplaced acute fracture As per CT scan, pt may have associated intramuscular hematoma in the anterior compartment. S/p orthopedic surgery on 4/16 which included right hip hemiarthroplasty revision. Patient seems to be doing well. Pain appears to be reasonably well controlled. Seen by physical therapy. She will need to go to skilled nursing facility for short-term rehabilitation.     Acute blood loss anemia Likely in the setting of surgery and it may also contributed by intramuscular hematoma. Hemoglobin dropped to 6.9. Patient was transfused 1 unit of blood. Hemoglobin has responded. Stable.    Mild sinus bradycardia Likely contributed by morphine. Heart rate improved.   Elevated blood  pressure likely contributed by pain Not on antihypertensive medication. Blood pressure has improved.   Dementia without behavioral disturbance Mental status appears to be at baseline. Continue supportive care.    UTI, site unspecified UA has no WBC but nitrite is positive. Urine culture growing more than 100,000 Klebsiella. Patient is nonverbal therefore unable to obtain history. Ciprofloxacin orally for a 5 day course.  Overall, stable. Okay for discharge to SNF today.   PERTINENT LABS:  The results of significant diagnostics from this hospitalization (including imaging, microbiology, ancillary and laboratory) are listed below for reference.    Microbiology: Recent Results (from the past 240 hour(s))  Urine culture     Status: Abnormal   Collection Time: 12/13/16  8:00 PM  Result Value Ref Range Status   Specimen Description URINE, CATHETERIZED  Final   Special Requests NONE  Final   Culture >=100,000 COLONIES/mL KLEBSIELLA PNEUMONIAE (A)  Final   Report Status 12/16/2016 FINAL  Final   Organism ID, Bacteria KLEBSIELLA PNEUMONIAE (A)  Final      Susceptibility   Klebsiella pneumoniae - MIC*    AMPICILLIN RESISTANT Resistant     CEFAZOLIN <=4 SENSITIVE Sensitive     CEFTRIAXONE <=1 SENSITIVE Sensitive     CIPROFLOXACIN <=0.25 SENSITIVE Sensitive     GENTAMICIN <=1 SENSITIVE Sensitive     IMIPENEM <=0.25 SENSITIVE Sensitive     NITROFURANTOIN <=16 SENSITIVE Sensitive     TRIMETH/SULFA <=20 SENSITIVE Sensitive     AMPICILLIN/SULBACTAM <=2 SENSITIVE Sensitive     PIP/TAZO <=4 SENSITIVE Sensitive     Extended ESBL NEGATIVE Sensitive     * >=100,000 COLONIES/mL KLEBSIELLA PNEUMONIAE  MRSA PCR Screening     Status: None   Collection Time: 12/16/16  2:51 PM  Result Value Ref Range Status   MRSA by PCR NEGATIVE NEGATIVE Final    Comment:        The GeneXpert MRSA Assay (FDA approved for NASAL specimens only), is one component of a comprehensive MRSA colonization surveillance  program. It is not intended to diagnose MRSA infection nor to guide or monitor treatment for MRSA infections.      Labs: Basic Metabolic Panel:  Recent Labs Lab 12/13/16 1824 12/14/16 0433 12/15/16 0637  NA 135 138 137  K 4.0 4.2 4.2  CL 100* 101 104  CO2 27 30 26   GLUCOSE 118* 110* 136*  BUN 17 13 13   CREATININE 0.81 0.81 0.87  CALCIUM 9.0 9.0 8.4*   Liver Function Tests:  Recent Labs Lab 12/13/16 1824 12/14/16 0433  AST 17 18  ALT 12* 12*  ALKPHOS 85 74  BILITOT 0.4 0.5  PROT 6.2* 6.0*  ALBUMIN 3.4* 3.3*   CBC:  Recent Labs Lab 12/13/16 1824 12/14/16 0433 12/15/16 0637 12/15/16 1134 12/16/16 0543 12/16/16 1538 12/17/16 0515  WBC 11.8* 6.8 8.2  --  8.4 10.3 9.0  NEUTROABS 10.4* 4.9  --   --   --   --   --   HGB 10.3* 9.7* 7.3* 7.8* 6.9* 9.7* 9.2*  HCT 31.1* 30.0* 22.0* 23.9* 21.1* 29.8* 28.3*  MCV 91.5 91.5 90.5  --  92.1 90.9 91.6  PLT 318 271 254  --  239 255 256    IMAGING STUDIES Dg Chest 1 View  Result Date: 12/13/2016 CLINICAL DATA:  Recent fall EXAM: CHEST 1 VIEW COMPARISON:  02/25/2015 FINDINGS: Cardiac shadow is mildly prominent but accentuated by the frontal technique. Aortic calcifications are again seen. The lungs are well aerated bilaterally. Large skin fold is noted laterally on the right. No pneumothorax is seen. No focal infiltrate is noted. No acute rib abnormality is seen. Chronic changes in the right eighth rib are seen posteriorly. T9 compression deformity is noted which is new from the prior exam but of uncertain chronicity. IMPRESSION: T9 compression deformity of uncertain chronicity. Chronic changes without other acute abnormality. Electronically Signed   By: Alcide Clever M.D.   On: 12/13/2016 19:41   Dg Pelvis 1-2 Views  Result Date: 12/13/2016 CLINICAL DATA:  Recent fall with pelvic pain, initial encounter EXAM: PELVIS - 1-2 VIEW COMPARISON:  None. FINDINGS: The pelvic ring is intact. Right hip prosthesis is noted. No soft  tissue abnormality is seen. IMPRESSION: No acute abnormality noted. Electronically Signed   By: Alcide Clever M.D.   On: 12/13/2016 19:45   Ct Head Wo Contrast  Result Date: 12/13/2016 CLINICAL DATA:  Fall.  Confusion. EXAM: CT HEAD WITHOUT CONTRAST CT MAXILLOFACIAL WITHOUT CONTRAST CT CERVICAL SPINE WITHOUT CONTRAST TECHNIQUE: Multidetector CT imaging of the head, cervical spine, and maxillofacial structures were performed using the standard protocol without intravenous contrast. Multiplanar CT image reconstructions of the cervical spine and maxillofacial structures were also generated. COMPARISON:  07/27/2014 head CT. FINDINGS: CT HEAD FINDINGS Brain: No evidence of parenchymal hemorrhage or extra-axial fluid collection. No mass lesion, mass effect, or midline shift. No CT evidence of acute infarction. Generalized cerebral volume loss. Nonspecific moderate subcortical and periventricular white matter hypodensity, most in keeping with chronic small vessel ischemic change. No ventriculomegaly. Vascular: Intracranial atherosclerosis.  No acute abnormality. Skull: No evidence of calvarial fracture. Sinuses/Orbits: No fluid levels. Mucoperiosteal thickening in the bilateral ethmoidal air cells and bilateral maxillary sinuses. Other:  The mastoid air cells are unopacified. CT MAXILLOFACIAL FINDINGS  Motion degraded scan. Osseous: No fracture or mandibular dislocation. No destructive process. Orbits: Negative. No traumatic or inflammatory finding. Sinuses: No fluid levels. Mucoperiosteal thickening in the bilateral ethmoidal air cells and bilateral maxillary sinuses. Soft tissues: Asymmetric right cheek contusion. Otherwise no acute abnormality. CT CERVICAL SPINE FINDINGS Motion degraded scan. Alignment: Straightening of the cervical spine. No subluxation. Dens is well positioned between the lateral masses of C1. Skull base and vertebrae: No acute fracture. No primary bone lesion or focal pathologic process. Soft  tissues and spinal canal: No prevertebral fluid or swelling. No visible canal hematoma. Disc levels: Mild degenerative disc disease throughout the cervical spine. Moderate facet arthropathy bilaterally in the cervical spine . No significant degenerative foraminal stenosis. Upper chest: Negative. Other: Visualized mastoid air cells appear clear. No discrete thyroid nodules. No pathologically enlarged cervical nodes. IMPRESSION: 1. No evidence of acute intracranial abnormality. No evidence of calvarial fracture . 2. Moderate chronic small vessel ischemia and generalized cerebral volume loss. 3. Mild right cheek contusion.  No maxillofacial fracture. 4. Chronic paranasal sinusitis. 5. No cervical spine fracture or subluxation. 6. Mild-to-moderate degenerative changes in the cervical spine as detailed . Electronically Signed   By: Delbert Phenix M.D.   On: 12/13/2016 19:56   Ct Cervical Spine Wo Contrast  Result Date: 12/13/2016 CLINICAL DATA:  Fall.  Confusion. EXAM: CT HEAD WITHOUT CONTRAST CT MAXILLOFACIAL WITHOUT CONTRAST CT CERVICAL SPINE WITHOUT CONTRAST TECHNIQUE: Multidetector CT imaging of the head, cervical spine, and maxillofacial structures were performed using the standard protocol without intravenous contrast. Multiplanar CT image reconstructions of the cervical spine and maxillofacial structures were also generated. COMPARISON:  07/27/2014 head CT. FINDINGS: CT HEAD FINDINGS Brain: No evidence of parenchymal hemorrhage or extra-axial fluid collection. No mass lesion, mass effect, or midline shift. No CT evidence of acute infarction. Generalized cerebral volume loss. Nonspecific moderate subcortical and periventricular white matter hypodensity, most in keeping with chronic small vessel ischemic change. No ventriculomegaly. Vascular: Intracranial atherosclerosis.  No acute abnormality. Skull: No evidence of calvarial fracture. Sinuses/Orbits: No fluid levels. Mucoperiosteal thickening in the bilateral  ethmoidal air cells and bilateral maxillary sinuses. Other:  The mastoid air cells are unopacified. CT MAXILLOFACIAL FINDINGS Motion degraded scan. Osseous: No fracture or mandibular dislocation. No destructive process. Orbits: Negative. No traumatic or inflammatory finding. Sinuses: No fluid levels. Mucoperiosteal thickening in the bilateral ethmoidal air cells and bilateral maxillary sinuses. Soft tissues: Asymmetric right cheek contusion. Otherwise no acute abnormality. CT CERVICAL SPINE FINDINGS Motion degraded scan. Alignment: Straightening of the cervical spine. No subluxation. Dens is well positioned between the lateral masses of C1. Skull base and vertebrae: No acute fracture. No primary bone lesion or focal pathologic process. Soft tissues and spinal canal: No prevertebral fluid or swelling. No visible canal hematoma. Disc levels: Mild degenerative disc disease throughout the cervical spine. Moderate facet arthropathy bilaterally in the cervical spine . No significant degenerative foraminal stenosis. Upper chest: Negative. Other: Visualized mastoid air cells appear clear. No discrete thyroid nodules. No pathologically enlarged cervical nodes. IMPRESSION: 1. No evidence of acute intracranial abnormality. No evidence of calvarial fracture . 2. Moderate chronic small vessel ischemia and generalized cerebral volume loss. 3. Mild right cheek contusion.  No maxillofacial fracture. 4. Chronic paranasal sinusitis. 5. No cervical spine fracture or subluxation. 6. Mild-to-moderate degenerative changes in the cervical spine as detailed . Electronically Signed   By: Delbert Phenix M.D.   On: 12/13/2016 19:56   Dg Pelvis Portable  Result Date: 12/14/2016 CLINICAL DATA:  Right hip hemiarthroplasty revision, with cerclage wire placement. Initial encounter. EXAM: PORTABLE PELVIS 1-2 VIEWS COMPARISON:  CT of the right hip performed 12/13/2016 FINDINGS: The patient is status post right hip hemiarthroplasty revision, with  placement of cerclage wires, transfixing the periprosthetic fracture in grossly anatomic alignment. No new fractures are seen. Overlying skin staples are seen, with scattered soft tissue air. The visualized bowel gas pattern is grossly unremarkable. The left hip joint is unremarkable in appearance. IMPRESSION: Status post right hip hemiarthroplasty revision, with placement of cerclage wires, transfixing the periprosthetic fracture in grossly anatomic alignment. Electronically Signed   By: Roanna Raider M.D.   On: 12/14/2016 16:37   Ct Hip Right Wo Contrast  Result Date: 12/13/2016 CLINICAL DATA:  Right hip pain after a fall. Unable to bear weight. Plain radiograph shows acute proximal right femoral fracture. Dementia. EXAM: CT OF THE RIGHT HIP WITHOUT CONTRAST TECHNIQUE: Multidetector CT imaging of the right hip was performed according to the standard protocol. Multiplanar CT image reconstructions were also generated. COMPARISON:  Pelvic and right femoral radiographs 12/14/2006 FINDINGS: Bones/Joint/Cartilage Postoperative changes with right hip hemiarthroplasty. Non cemented femoral component. There is an acute appearing. Prosthetic fracture extending from the distal tip of the proxy cysts proximally. Cerclage wires surround the inter trochanteric region with nondisplaced fracture of the greater trochanter, possibly acute. No significant displacement of fractures. No dislocation of the hip joint. Ligaments Suboptimally assessed by CT. Muscles and Tendons Mild fullness in loss of fat planes in the anterior compartment musculature of the right thigh likely representing intramuscular hematoma. Soft tissues Vascular calcifications are present. Small lipoma demonstrated adjacent to the superior acetabulum laterally. Calcifications in the aorta and iliac arteries. Visualized pelvic bowel loops are not distended. No mass demonstrated in the pelvis although visualization is limited by streak artifact from the right  hip arthroplasty. IMPRESSION: Right hip hemiarthroplasty with periprosthetic nondisplaced acute fractures. Probable associated intramuscular hematoma in the anterior compartment. Electronically Signed   By: Burman Nieves M.D.   On: 12/13/2016 23:49   Ct Femur Right Wo Contrast  Result Date: 12/13/2016 CLINICAL DATA:  Right hip pain after a fall. Unable to bear weight. Plain radiograph shows acute proximal right femoral fracture. Dementia. EXAM: CT OF THE RIGHT HIP WITHOUT CONTRAST TECHNIQUE: Multidetector CT imaging of the right hip was performed according to the standard protocol. Multiplanar CT image reconstructions were also generated. COMPARISON:  Pelvic and right femoral radiographs 12/14/2006 FINDINGS: Bones/Joint/Cartilage Postoperative changes with right hip hemiarthroplasty. Non cemented femoral component. There is an acute appearing. Prosthetic fracture extending from the distal tip of the proxy cysts proximally. Cerclage wires surround the inter trochanteric region with nondisplaced fracture of the greater trochanter, possibly acute. No significant displacement of fractures. No dislocation of the hip joint. Ligaments Suboptimally assessed by CT. Muscles and Tendons Mild fullness in loss of fat planes in the anterior compartment musculature of the right thigh likely representing intramuscular hematoma. Soft tissues Vascular calcifications are present. Small lipoma demonstrated adjacent to the superior acetabulum laterally. Calcifications in the aorta and iliac arteries. Visualized pelvic bowel loops are not distended. No mass demonstrated in the pelvis although visualization is limited by streak artifact from the right hip arthroplasty. IMPRESSION: Right hip hemiarthroplasty with periprosthetic nondisplaced acute fractures. Probable associated intramuscular hematoma in the anterior compartment. Electronically Signed   By: Burman Nieves M.D.   On: 12/13/2016 23:49   Dg C-arm 1-60 Min  Result  Date: 12/14/2016 CLINICAL DATA:  Right hip hemiarthroplasty revision with cable  placement. Fluoro time reported is 7 seconds. EXAM: DG C-ARM 61-120 MIN; OPERATIVE RIGHT HIP WITH PELVIS COMPARISON:  Preoperative CT scan through the right hip and femur FINDINGS: 3 fluoro spot images reveal the stem portion of the prosthesis. A fracture line in the native bone is noted along the medial aspect of the proximal third of the femur. Four metallic cables are placed circumferentially around the proximal humeral shaft. The fracture line extends below the lower most of the wire cables. IMPRESSION: 3 fluoro spot images from the operating room revealing placement of for wire cables around the fractured proximal femoral shaft. The inferior aspect of fracture line extends below the lowermost cable. Electronically Signed   By: David  [place] M.D.   On: 12/14/2016 15:10   Dg Hip Operative Unilat W Or W/o Pelvis Right  Result Date: 12/14/2016 CLINICAL DATA:  Right hip hemiarthroplasty revision with cable placement. Fluoro time reported is 7 seconds. EXAM: DG C-ARM 61-120 MIN; OPERATIVE RIGHT HIP WITH PELVIS COMPARISON:  Preoperative CT scan through the right hip and femur FINDINGS: 3 fluoro spot images reveal the stem portion of the prosthesis. A fracture line in the native bone is noted along the medial aspect of the proximal third of the femur. Four metallic cables are placed circumferentially around the proximal humeral shaft. The fracture line extends below the lower most of the wire cables. IMPRESSION: 3 fluoro spot images from the operating room revealing placement of for wire cables around the fractured proximal femoral shaft. The inferior aspect of fracture line extends below the lowermost cable. Electronically Signed   By: David  [place] M.D.   On: 12/14/2016 15:10   Dg Femur Min 2 Views Left  Result Date: 12/13/2016 CLINICAL DATA:  Recent fall with left leg pain, initial encounter EXAM: LEFT FEMUR 2 VIEWS COMPARISON:   None FINDINGS: No acute fracture or dislocation is noted. Diffuse vascular calcifications are seen. The visualized pelvis is within normal limits. No soft tissue abnormality is noted. IMPRESSION: No acute abnormality seen. Electronically Signed   By: Alcide Clever M.D.   On: 12/13/2016 19:44   Dg Femur Min 2 Views Right  Result Date: 12/13/2016 CLINICAL DATA:  Recent fall with right leg pain, initial encounter EXAM: RIGHT FEMUR 2 VIEWS COMPARISON:  None. FINDINGS: Right hip prosthesis is noted. It appears well seated within the acetabulum. No acute fracture or dislocation is noted. Diffuse vascular calcifications are seen. The visualized pelvis is within normal limits. IMPRESSION: No acute abnormality noted. Electronically Signed   By: Alcide Clever M.D.   On: 12/13/2016 19:41   Ct Maxillofacial Wo Contrast  Result Date: 12/13/2016 CLINICAL DATA:  Fall.  Confusion. EXAM: CT HEAD WITHOUT CONTRAST CT MAXILLOFACIAL WITHOUT CONTRAST CT CERVICAL SPINE WITHOUT CONTRAST TECHNIQUE: Multidetector CT imaging of the head, cervical spine, and maxillofacial structures were performed using the standard protocol without intravenous contrast. Multiplanar CT image reconstructions of the cervical spine and maxillofacial structures were also generated. COMPARISON:  07/27/2014 head CT. FINDINGS: CT HEAD FINDINGS Brain: No evidence of parenchymal hemorrhage or extra-axial fluid collection. No mass lesion, mass effect, or midline shift. No CT evidence of acute infarction. Generalized cerebral volume loss. Nonspecific moderate subcortical and periventricular white matter hypodensity, most in keeping with chronic small vessel ischemic change. No ventriculomegaly. Vascular: Intracranial atherosclerosis.  No acute abnormality. Skull: No evidence of calvarial fracture. Sinuses/Orbits: No fluid levels. Mucoperiosteal thickening in the bilateral ethmoidal air cells and bilateral maxillary sinuses. Other:  The mastoid air cells are  unopacified.  CT MAXILLOFACIAL FINDINGS Motion degraded scan. Osseous: No fracture or mandibular dislocation. No destructive process. Orbits: Negative. No traumatic or inflammatory finding. Sinuses: No fluid levels. Mucoperiosteal thickening in the bilateral ethmoidal air cells and bilateral maxillary sinuses. Soft tissues: Asymmetric right cheek contusion. Otherwise no acute abnormality. CT CERVICAL SPINE FINDINGS Motion degraded scan. Alignment: Straightening of the cervical spine. No subluxation. Dens is well positioned between the lateral masses of C1. Skull base and vertebrae: No acute fracture. No primary bone lesion or focal pathologic process. Soft tissues and spinal canal: No prevertebral fluid or swelling. No visible canal hematoma. Disc levels: Mild degenerative disc disease throughout the cervical spine. Moderate facet arthropathy bilaterally in the cervical spine . No significant degenerative foraminal stenosis. Upper chest: Negative. Other: Visualized mastoid air cells appear clear. No discrete thyroid nodules. No pathologically enlarged cervical nodes. IMPRESSION: 1. No evidence of acute intracranial abnormality. No evidence of calvarial fracture . 2. Moderate chronic small vessel ischemia and generalized cerebral volume loss. 3. Mild right cheek contusion.  No maxillofacial fracture. 4. Chronic paranasal sinusitis. 5. No cervical spine fracture or subluxation. 6. Mild-to-moderate degenerative changes in the cervical spine as detailed . Electronically Signed   By: Delbert Phenix M.D.   On: 12/13/2016 19:56    DISCHARGE EXAMINATION: Vitals:   12/16/16 1224 12/16/16 1943 12/17/16 0300 12/17/16 0907  BP: 134/60 139/74 (!) 146/52 (!) 154/55  Pulse: 70 79 63 70  Resp: Temp: 98.2 F (36.8 C) 98.6 F (37 C) 98.3 F (36.8 C) 98.6 F (37 C)  TempSrc: Axillary Axillary Axillary Oral  SpO2:  100%  98%   General appearance: alert, distracted and no distress Resp: clear to auscultation  bilaterally Cardio: regular rate and rhythm, S1, S2 normal, no murmur, click, rub or gallop GI: soft, non-tender; bowel sounds normal; no masses,  no organomegaly Extremities: extremities normal, atraumatic, no cyanosis or edema  DISPOSITION: SNF  Discharge Instructions    Call MD for:  difficulty breathing, headache or visual disturbances    Complete by:  As directed    Call MD for:  extreme fatigue    Complete by:  As directed    Call MD for:  persistant dizziness or light-headedness    Complete by:  As directed    Call MD for:  persistant nausea and vomiting    Complete by:  As directed    Call MD for:  redness, tenderness, or signs of infection (pain, swelling, redness, odor or green/yellow discharge around incision site)    Complete by:  As directed    Call MD for:  severe uncontrolled pain    Complete by:  As directed    Call MD for:  temperature >100.4    Complete by:  As directed    Discharge instructions    Complete by:  As directed    Check CBC and basic metabolic panel in 1 week. Follow-up with orthopedics in 2 weeks.  You were cared for by a hospitalist during your hospital stay. If you have any questions about your discharge medications or the care you received while you were in the hospital after you are discharged, you can call the unit and asked to speak with the hospitalist on call if the hospitalist that took care of you is not available. Once you are discharged, your primary care physician will handle any further medical issues. Please note that NO REFILLS for any discharge medications will be authorized once you are discharged, as  it is imperative that you return to your primary care physician (or establish a relationship with a primary care physician if you do not have one) for your aftercare needs so that they can reassess your need for medications and monitor your lab values. If you do not have a primary care physician, you can call [phone] for a physician referral.     Increase activity slowly    Complete by:  As directed       ALLERGIES:  Allergies  Allergen Reactions  . Penicillins Rash     Current Discharge Medication List    START taking these medications   Details  acetaminophen (TYLENOL) 325 MG tablet Take 2 tablets (650 mg total) by mouth every 6 (six) hours as needed for mild pain (or Fever >/= 101).    ciprofloxacin (CIPRO) 500 MG tablet Take 1 tablet (500 mg total) by mouth 2 (two) times daily. For 4 more days Qty: 8 tablet, Refills: 0    docusate sodium (COLACE) 100 MG capsule Take 1 capsule (100 mg total) by mouth 2 (two) times daily. Qty: 10 capsule, Refills: 0    feeding supplement, ENSURE ENLIVE, (ENSURE ENLIVE) LIQD Take 237 mLs by mouth 2 (two) times daily between meals. Qty: 237 mL, Refills: 12    HYDROcodone-acetaminophen (NORCO/VICODIN) 5-325 MG tablet Take 1 tablet by mouth every 6 (six) hours as needed for moderate pain. Qty: 20 tablet, Refills: 0    sodium phosphate (FLEET) 7-19 GM/118ML ENEM Place 133 mLs (1 enema total) rectally daily as needed for severe constipation. Refills: 0      CONTINUE these medications which have CHANGED   Details  aspirin EC 325 MG EC tablet Take 1 tablet (325 mg total) by mouth daily with breakfast. Qty: 30 tablet, Refills: 0    clonazePAM (KLONOPIN) 0.5 MG tablet Take 1 tablet (0.5 mg total) by mouth 2 (two) times daily as needed for anxiety. Qty: 20 tablet, Refills: 0    traZODone (DESYREL) 50 MG tablet Take 1 tablet (50 mg total) by mouth at bedtime as needed for sleep. Qty: 30 tablet, Refills: 0      CONTINUE these medications which have NOT CHANGED   Details  polyethylene glycol (MIRALAX / GLYCOLAX) packet Take 17 g by mouth at bedtime.    ranitidine (ZANTAC) 150 MG tablet Take 150 mg by mouth 2 (two) times daily.          Contact information for follow-up providers    Eldred Manges, MD Follow up in 2 week(s).   Specialty:  Orthopedic Surgery Contact  information: [address] [place] [address] [phone]            Contact information for after-discharge care    Destination    [facility] Follow up.   Specialty:  Skilled Nursing Facility Contact information: [address] [place] [address] [phone]                  TOTAL DISCHARGE TIME: 35 mins  [facility]  [facility] Pager [phone]  12/17/2016, 10:36 AM

## 2016-12-17 NOTE — Progress Notes (Signed)
   Subjective: 3 Days Post-Op Procedure(s) (LRB): RIGHT HIP HEMIARTHROPLASTY REVISION WITH CABLE PLACEMENT (Right) Patient reports pain as  None likely from dementia. Did shake her head no for question of pain.   Objective: Vital signs in last 24 hours: Temp:  [98.2 F (36.8 C)-99.8 F (37.7 C)] 98.3 F (36.8 C) (04/19 0300) Pulse Rate:  [63-79] 63 (04/19 0300) Resp:  [16] 16 (04/19 0300) BP: (134-146)/(52-74) 146/52 (04/19 0300) SpO2:  [95 %-100 %] 100 % (04/18 1943)  Intake/Output from previous day: 04/18 0701 - 04/19 0700 In: 1365 [P.O.:360; Blood:1005] Out: 700 [Urine:700] Intake/Output this shift: No intake/output data recorded.   Recent Labs  12/15/16 0637 12/15/16 1134 12/16/16 0543 12/16/16 1538 12/17/16 0515  HGB 7.3* 7.8* 6.9* 9.7* 9.2*    Recent Labs  12/16/16 1538 12/17/16 0515  WBC 10.3 9.0  RBC 3.28* 3.09*  HCT 29.8* 28.3*  PLT 255 256    Recent Labs  12/15/16 0637  NA 137  K 4.2  CL 104  CO2 26  BUN 13  CREATININE 0.87  GLUCOSE 136*  CALCIUM 8.4*   No results for input(s): LABPT, INR in the last 72 hours.  Neurologically intact No results found.  Assessment/Plan: 3 Days Post-Op Procedure(s) (LRB): RIGHT HIP HEMIARTHROPLASTY REVISION WITH CABLE PLACEMENT (Right) Plan:  OOB recliner . followup with me in 2 wks  Eldred Manges 12/17/2016, 7:34 AM

## 2016-12-17 NOTE — Clinical Social Work Placement (Signed)
   CLINICAL SOCIAL WORK PLACEMENT  NOTE  Date:  12/17/2016  Patient Details  Name: Jill [name] MRN: [id number] Date of Birth: 1928-10-24  Clinical Social Work is seeking post-discharge placement for this patient at the Skilled  Nursing Facility level of care (*CSW will initial, date and re-position this form in  chart as items are completed):  Yes   Patient/family provided with [facility] Clinical Social Work Department's list of facilities offering this level of care within the geographic area requested by the patient (or if unable, by the patient's family).  Yes   Patient/family informed of their freedom to choose among providers that offer the needed level of care, that participate in Medicare, Medicaid or managed care program needed by the patient, have an available bed and are willing to accept the patient.      Patient/family informed of 's ownership interest in [facility] and [facility], as well as of the fact that they are under no obligation to receive care at these facilities.  PASRR submitted to EDS on       PASRR number received on 12/16/16     Existing PASRR number confirmed on       FL2 transmitted to all facilities in geographic area requested by pt/family on 12/16/16     FL2 transmitted to all facilities within larger geographic area on 12/16/16     Patient informed that his/her managed care company has contracts with or will negotiate with certain facilities, including the following:        Yes   Patient/family informed of bed offers received.  Patient chooses bed at [facility]     Physician recommends and patient chooses bed at      Patient to be transferred to [facility] on 12/17/16.  Patient to be transferred to facility by [facility]     Patient family notified on 12/17/16 of transfer.  Name of family member notified:  Ms. Rush Landmark, daughter     PHYSICIAN Please sign FL2,  Please prepare prescriptions, Please prepare priority discharge summary, including medications, Please sign DNR     Additional Comment:    _______________________________________________ Tresa Moore, LCSW 12/17/2016, 11:43 AM

## 2016-12-17 NOTE — Discharge Instructions (Signed)
Hip Fracture A hip fracture is a fracture of the upper part of your thigh bone (femur). What are the causes? A hip fracture is caused by a direct blow to the side of your hip. This is usually the result of a fall but can occur in other circumstances, such as an automobile accident. What increases the risk? There is an increased risk of hip fractures in people with:  An unsteady walking pattern (gait) and those with conditions that contribute to poor balance, such as Parkinson's disease or dementia.  Osteopenia and osteoporosis.  Cancer that spreads to the leg bones.  Certain metabolic diseases.  What are the signs or symptoms? Symptoms of hip fracture include:  Pain over the injured hip.  Inability to put weight on the leg in which the fracture occurred (although, some patients are able to walk after a hip fracture).  Toes and foot of the affected leg point outward when you lie down.  How is this diagnosed? A physical exam can determine if a hip fracture is likely to have occurred. X-ray exams are needed to confirm the fracture and to look for other injuries. The X-ray exam can help to determine the type of hip fracture. Rarely, the fracture is not visible on an X-ray image and a CT scan or MRI will have to be done. How is this treated? The treatment for a fracture is usually surgery. This means using a screw, nail, or rod to hold the bones in place. Follow these instructions at home: Take all medicines as directed by your health care provider. Contact a health care provider if: Pain continues, even after taking pain medicine. This information is not intended to replace advice given to you by your health care provider. Make sure you discuss any questions you have with your health care provider. Document Released: 08/17/2005 Document Revised: 01/23/2016 Document Reviewed: 03/29/2013 Elsevier Interactive Patient Education  2017 Elsevier Inc.  

## 2016-12-17 NOTE — Progress Notes (Signed)
Pt for d/c today, [facility] transportation here to pick pt up. D/c instructions reviewed with pt's daughter. Attempted to call report to [facility] facility where pt is going for rehab, call sent to unit/desk where pt going but no answer. Will attempt again. Daughter took pt's belongings with her. Transporters took information packet with them to give facility which included scripts, DNR out of facility form, copy of d/c instructions and other forms SW has placed with d/c packet.

## 2016-12-17 NOTE — Social Work (Signed)
Clinical Social Worker facilitated patient discharge including contacting patient family and facility to confirm patient discharge plans.  Clinical information faxed to facility and family agreeable with plan.  CSW arranged ambulance transport via [facility] to [facility].  RN to call [phone] report prior to discharge. Pt going to Rm 160.  Clinical Social Worker will sign off for now as social work intervention is no longer needed. Please consult [place] again if new need arises.  Keene Breath, LCSW Clinical Social Worker [phone]

## 2016-12-17 NOTE — Progress Notes (Signed)
Physical Therapy Treatment Patient Details Name: Jill [name] MRN: [id number] DOB: September 11, 1928 Today's Date: 12/17/2016    History of Present Illness Pt is a 83 yo female admitted with R hip pain experienced after a fall. Pt s/p R hip hemiarthroplasty revison with cable placement. Pt has dementia and is nonverbal     PT Comments    Patient continues to require max/total A +2 for mobility. Current plan remains appropriate.    Follow Up Recommendations  SNF     Equipment Recommendations  None recommended by PT    Recommendations for Other Services OT consult     Precautions / Restrictions Precautions Precautions: Posterior Hip Precaution Booklet Issued: Yes (comment) Required Braces or Orthoses: Knee Immobilizer - Right Knee Immobilizer - Right: On at all times Restrictions Weight Bearing Restrictions: Yes RLE Weight Bearing: Weight bearing as tolerated    Mobility  Bed Mobility Overal bed mobility: Needs Assistance Bed Mobility: Supine to Sit     Supine to sit: +2 for physical assistance;HOB elevated;Max assist;Total assist     General bed mobility comments: total A +2initially and max A +2 once task initiated; pt assisted with bilat UE when coming up into sitting however not following commands; bed pad used to scoot hips to EOB and position  Transfers Overall transfer level: Needs assistance Equipment used: None Transfers: Stand Pivot Transfers   Stand pivot transfers: Max assist;+2 physical assistance       General transfer comment: +2 to come into standing and to pivot bilat feet  Ambulation/Gait                 Stairs            Wheelchair Mobility    Modified Rankin (Stroke Patients Only)       Balance Overall balance assessment: Needs assistance Sitting-balance support: Feet supported;Bilateral upper extremity supported Sitting balance-Leahy Scale: Fair       Standing balance-Leahy Scale: Zero                               Cognition Arousal/Alertness: Awake/alert Behavior During Therapy: WFL for tasks assessed/performed Overall Cognitive Status: History of cognitive impairments - at baseline                                 General Comments: pt has Alzheimer's with associated dementia--nonverbal      Exercises      General Comments        Pertinent Vitals/Pain Pain Assessment: Faces Faces Pain Scale: Hurts little more Pain Location: R hip with transitional movements Pain Descriptors / Indicators: Grimacing;Guarding Pain Intervention(s): Limited activity within patient's tolerance;Monitored during session;Repositioned    Home Living                      Prior Function            PT Goals (current goals can now be found in the care plan section) Acute Rehab PT Goals PT Goal Formulation: With patient/family Time For Goal Achievement: 12/22/16 Potential to Achieve Goals: Fair Progress towards PT goals: Progressing toward goals    Frequency    Min 3X/week      PT Plan Current plan remains appropriate    Co-evaluation             End of Session Equipment Utilized During Treatment: Right knee immobilizer;Gait  belt Activity Tolerance: Patient tolerated treatment well Patient left: in chair;with call bell/phone within reach;with chair alarm set Nurse Communication: Mobility status PT Visit Diagnosis: Unsteadiness on feet (R26.81);Other abnormalities of gait and mobility (R26.89);Pain;Other symptoms and signs involving the nervous system (R29.898);Repeated falls (R29.6);History of falling (Z91.81) Pain - Right/Left: Right Pain - part of body: Hip     Time: 1010-1028 PT Time Calculation (min) (ACUTE ONLY): 18 min  Charges:  $Therapeutic Activity: 8-22 mins                    G Codes:       Erline Levine, PTA Pager: [phone]     Carolynne Edouard 12/17/2016, 1:27 PM

## 2016-12-17 NOTE — Progress Notes (Signed)
Report called to [facility], report given to nurse Brittney on [facility], pt going to room 160.

## 2016-12-18 ENCOUNTER — Other Ambulatory Visit: Payer: Self-pay

## 2016-12-18 MED ORDER — CLONAZEPAM 0.5 MG PO TABS: 0.5000 mg | ORAL_TABLET | Freq: Two times a day (BID) | ORAL | 0 refills | Status: DC

## 2016-12-18 MED ORDER — CLONAZEPAM 0.25 MG PO TBDP: 0.2500 mg | ORAL_TABLET | Freq: Two times a day (BID) | ORAL | 0 refills | Status: DC

## 2016-12-21 ENCOUNTER — Encounter: Payer: Self-pay | Admitting: Adult Health

## 2016-12-21 ENCOUNTER — Telehealth (INDEPENDENT_AMBULATORY_CARE_PROVIDER_SITE_OTHER): Payer: Self-pay | Admitting: Orthopaedic Surgery

## 2016-12-21 ENCOUNTER — Non-Acute Institutional Stay (SKILLED_NURSING_FACILITY): Payer: Medicare Other | Admitting: Adult Health

## 2016-12-21 DIAGNOSIS — K5901 Slow transit constipation: Secondary | ICD-10-CM | POA: Diagnosis not present

## 2016-12-21 DIAGNOSIS — N390 Urinary tract infection, site not specified: Secondary | ICD-10-CM

## 2016-12-21 DIAGNOSIS — E440 Moderate protein-calorie malnutrition: Secondary | ICD-10-CM | POA: Diagnosis not present

## 2016-12-21 DIAGNOSIS — K219 Gastro-esophageal reflux disease without esophagitis: Secondary | ICD-10-CM | POA: Diagnosis not present

## 2016-12-21 DIAGNOSIS — E46 Unspecified protein-calorie malnutrition: Secondary | ICD-10-CM | POA: Insufficient documentation

## 2016-12-21 DIAGNOSIS — Z96649 Presence of unspecified artificial hip joint: Secondary | ICD-10-CM | POA: Diagnosis not present

## 2016-12-21 DIAGNOSIS — M978XXA Periprosthetic fracture around other internal prosthetic joint, initial encounter: Secondary | ICD-10-CM

## 2016-12-21 NOTE — Telephone Encounter (Signed)
Please advise 

## 2016-12-21 NOTE — Progress Notes (Signed)
Location:   [facility] Room Number: 160 B Place of Service:  SNF (31)   CODE STATUS: DNR  Allergies  Allergen Reactions  . Penicillins Rash    Chief Complaint  Patient presents with  . Hospitalization Follow-up    Hospital Follow up    HPI:  She has been hospitalized after sustaining a fall with a right hip fracture. She is here for short term rehab. She is unable to participate in the hpi or ros. There are no nursing concerns at this time. More than likely this does represent a long term placement for her.    Past Medical History:  Diagnosis Date  . Anemia   . Anxiety   . Depression   . GERD (gastroesophageal reflux disease)   . History of blood transfusion 12/16/2016  . Myocardial infarction [facility]) ?2013   "silent"  . Stroke [facility]) 07/2014   "vascular dementia from undetected strokes/MD report" (12/16/2016  . Vascular dementia     Past Surgical History:  Procedure Laterality Date  . CATARACT EXTRACTION W/ INTRAOCULAR LENS IMPLANT     "? side"  . FRACTURE SURGERY    . HIP ARTHROPLASTY Right 02/25/2015   Procedure: ARTHROPLASTY BIPOLAR HIP (HEMIARTHROPLASTY);  Surgeon: Eldred Manges, MD;  Location: [facility] OR;  Service: Orthopedics;  Laterality: Right;  . TOTAL HIP REVISION Right 12/14/2016   Procedure: RIGHT HIP HEMIARTHROPLASTY REVISION WITH CABLE PLACEMENT;  Surgeon: Eldred Manges, MD;  Location: [facility] OR;  Service: Orthopedics;  Laterality: Right;  Marland Kitchen VAGINAL HYSTERECTOMY      Social History   Social History  . Marital status: Widowed    Spouse name: N/A  . Number of children: N/A  . Years of education: N/A   Occupational History  . Not on file.   Social History Main Topics  . Smoking status: Former Games developer  . Smokeless tobacco: Never Used     Comment: "social smoker; quit in the 1970s"  . Alcohol use No  . Drug use: No  . Sexual activity: Not on file   Other Topics Concern  . Not on file   Social History Narrative  . No narrative on file    Family History  Problem Relation Age of Onset  . Family history unknown: Yes      VITAL SIGNS BP 100/61   Pulse 68   Temp 97.4 F (36.3 C)   Resp 20   SpO2 94%   No weight or height available   Patient's Medications  New Prescriptions   No medications on file  Previous Medications   ACETAMINOPHEN (TYLENOL) 325 MG TABLET    Take 2 tablets (650 mg total) by mouth every 6 (six) hours as needed for mild pain (or Fever >/= 101).   ASPIRIN EC 325 MG EC TABLET    Take 1 tablet (325 mg total) by mouth daily with breakfast.   CIPROFLOXACIN (CIPRO) 500 MG TABLET    Take 1 tablet (500 mg total) by mouth 2 (two) times daily. For 4 more days   CLONAZEPAM (KLONOPIN) 0.25 MG DISINTEGRATING TABLET    Take 1 tablet (0.25 mg total) by mouth every 12 (twelve) hours. X 5 days   CLONAZEPAM (KLONOPIN) 0.5 MG TABLET    Take 1 tablet (0.5 mg total) by mouth every 12 (twelve) hours. For 5 days   DOCUSATE SODIUM (COLACE) 100 MG CAPSULE    Take 1 capsule (100 mg total) by mouth 2 (two) times daily.   HYDROCODONE-ACETAMINOPHEN (NORCO/VICODIN) 5-325  MG TABLET    Take 1 tablet by mouth every 6 (six) hours as needed for moderate pain.   NUTRITIONAL SUPPLEMENTS (NUTRITIONAL SUPPLEMENT PO)    Take by mouth. House 2.0 - give orally two times daily between meals   POLYETHYLENE GLYCOL (MIRALAX / GLYCOLAX) PACKET    Take 17 g by mouth at bedtime.   RANITIDINE (ZANTAC) 150 MG TABLET    Take 150 mg by mouth 2 (two) times daily.   SODIUM PHOSPHATE (FLEET) 7-19 GM/118ML ENEM    Place 133 mLs (1 enema total) rectally daily as needed for severe constipation.   TRAZODONE (DESYREL) 50 MG TABLET    Take 1 tablet (50 mg total) by mouth at bedtime as needed for sleep.   UNABLE TO FIND    HSG Regular Diet - HSG Mech Soft Texture  Modified Medications   No medications on file  Discontinued Medications   FEEDING SUPPLEMENT, ENSURE ENLIVE, (ENSURE ENLIVE) LIQD    Take 237 mLs by mouth 2 (two) times daily between meals.      SIGNIFICANT DIAGNOSTIC EXAMS  12-13-16 right femur x-ray: No acute abnormality noted.  12-13-16: left femur x-ray: No acute abnormality seen.  12-13-16: pelvic x-ray: No acute abnormality noted.  12-13-16: chest x-ray: T9 compression deformity of uncertain chronicity. Chronic changes without other acute abnormality.  12-13-16: ct of head, maxillofacial, cervical spine x-ray: 1. No evidence of acute intracranial abnormality. No evidence of calvarial fracture . 2. Moderate chronic small vessel ischemia and generalized cerebral volume loss. 3. Mild right cheek contusion.  No maxillofacial fracture. 4. Chronic paranasal sinusitis. 5. No cervical spine fracture or subluxation. 6. Mild-to-moderate degenerative changes in the cervical spine as detailed .  12-13-16: right hip x-ray: Right hip hemiarthroplasty with periprosthetic nondisplaced acute fractures. Probable associated intramuscular hematoma in the anterior compartment.   LABS REVIEWED:   12-13-16: wbc 11.8; hgb 10.3; hct 31.1; mcv 91.5; plt 318; glucose 118; bun 17; creat 0.81; k+ 4.0 na++ 135; liver normal albumin 3.4; urine culture: klebsiella pneumoniae 12-15-16; wbc 8.2; hgb 7.3; hct 22.0; mcv 90.5; plt 254; glucose 136; bun 13; creat 0.81; k+ 4.2; na++ 137 12-17-16: wbc 9.0; hgb 9.2; hct 28.3; mcv 91.6; plt 256    Review of Systems  Unable to perform ROS: Dementia      Physical Exam  Constitutional: No distress.  Frail   Eyes: Conjunctivae are normal.  Neck: Neck supple. No JVD present. No thyromegaly present.  Cardiovascular: Normal rate, regular rhythm and intact distal pulses.   Respiratory: Effort normal and breath sounds normal. No respiratory distress. She has no wheezes.  GI: Soft. Bowel sounds are normal. She exhibits no distension. There is no tenderness.  Musculoskeletal: She exhibits no edema.  Able to move all extremities  Is status post right hip fracture has knee immobilizer in place    Lymphadenopathy:     She has no cervical adenopathy.  Neurological: She is alert.  Skin: Skin is warm and dry. She is not diaphoretic.  Right hip incision line without signs of infection present   Psychiatric:  Is anxious      ASSESSMENT/ PLAN:  1.  Constipation: will continue colace twice daily and miralax daily   2. gerd: will continue zantac 150 mg twice daily   3. Anxiety: will continue to wean her off klonopin; will continue trazodone 50 mg nightly as needed  4. Protein calorie malnutrition: albumin 3.4; will continue supplements per facility protocol  5. Right femoral neck fracture: is  status post right hip hemiarthroplasty: will continue therapy as directed; will follow up orthopedics. Will continue asa 325 mg daily will continue vicodin 5/325 mg every 6 hours as needed   6. UTI: will complete cipro and will monitor   7. Dementia: multi-infarct: no significant change in status; is presently not on medications; will continue to monitor her status.   Will need to follow up with Dr. Ophelia Charter in orthopedics in 2 weeks.  Will check bmp and cbc    Time spent with patient  50   minutes >50% time spent counseling; reviewing medical record; tests; labs; and developing future plan of care   MD is aware of resident's narcotic use and is in agreement with current plan of care. We will attempt to wean resident as apropriate   Synthia Innocent NP [facility]  Contact [phone] Monday through Friday 8am- 5pm  After hours call [phone]

## 2016-12-21 NOTE — Telephone Encounter (Signed)
Pt nursing home [facility] requesting something stating pt weight bearing status and what he is wanting to accomplish faxed over please.   Fax to [phone]

## 2016-12-21 NOTE — Telephone Encounter (Signed)
Also states that dressing came off and they been applying dry dressing --please advise on this also.

## 2016-12-21 NOTE — Telephone Encounter (Signed)
Betadine swab and dry dressing change.  OK to [facility]

## 2016-12-22 NOTE — Telephone Encounter (Signed)
Per Dr. Ophelia Charter, patient has dementia and she will get up and walk as she wants to. PT work on Print production planner.  Faxed dressing changes and PT info

## 2016-12-23 ENCOUNTER — Encounter: Payer: Self-pay | Admitting: Internal Medicine

## 2016-12-23 ENCOUNTER — Non-Acute Institutional Stay (SKILLED_NURSING_FACILITY): Payer: Medicare Other | Admitting: Internal Medicine

## 2016-12-23 DIAGNOSIS — N390 Urinary tract infection, site not specified: Secondary | ICD-10-CM | POA: Diagnosis not present

## 2016-12-23 DIAGNOSIS — W19XXXS Unspecified fall, sequela: Secondary | ICD-10-CM | POA: Diagnosis not present

## 2016-12-23 DIAGNOSIS — Z96649 Presence of unspecified artificial hip joint: Secondary | ICD-10-CM

## 2016-12-23 DIAGNOSIS — R0681 Apnea, not elsewhere classified: Secondary | ICD-10-CM | POA: Diagnosis not present

## 2016-12-23 DIAGNOSIS — F0150 Vascular dementia without behavioral disturbance: Secondary | ICD-10-CM | POA: Diagnosis not present

## 2016-12-23 DIAGNOSIS — M978XXA Periprosthetic fracture around other internal prosthetic joint, initial encounter: Secondary | ICD-10-CM | POA: Diagnosis not present

## 2016-12-23 NOTE — Assessment & Plan Note (Addendum)
Ortho F/U with Dr Ophelia Charter week of 4/30 Wean opioids due to intermittent apnea; this will also address the potential risk of excess Tylenol administration

## 2016-12-23 NOTE — Assessment & Plan Note (Signed)
Namenda and Aricept would not be of any clinical benefit

## 2016-12-23 NOTE — Assessment & Plan Note (Signed)
Klebsiella treated with 5 days Cipro

## 2016-12-23 NOTE — Patient Instructions (Signed)
See assessment and plan under each diagnosis in the problem list and acutely for this visit 

## 2016-12-23 NOTE — Assessment & Plan Note (Signed)
Patient unable to provide history of any possible prodrome Safety measures as per protocol

## 2016-12-23 NOTE — Progress Notes (Signed)
Patient ID: Jill [name], female   DOB: December 24, 1928, 83 y.o.   MRN: [id number]   This is a comprehensive admission note to [facility] performed on this date less than 30 days from date of admission. Included are preadmission medical/surgical history;reconciled medication list; family history; social history and comprehensive review of systems.  Corrections and additions to the records were documented . Comprehensive physical exam was also performed. Additionally a clinical summary was entered for each active diagnosis pertinent to this admission in the Problem List to enhance continuity of care.  WUJ:WJXBJYN Clark,NP  HPI: The patient was hospitalized 4/15-4/19/18 after an unwitnessed fall at the skilled nursing facility. She has dementia and is nonverbal, no prodrome for the fall could be determined. ER CT revealed right knee periprosthetic fracture. Dr Ophelia Charter performed right hip hemiarthroplasty revision with placement of longstem coated press-fit stem with cerclage wire fixation of femur fracture 4/16. CT suggested associated intramuscular hematoma in the anterior compartment. Hemoglobin dropped to 6.9, one unit of packed cells was transfused Her bradycardia was attributed to morphine UA was positive for nitrites and a culture grew more than 100,000 Klebsiella Cipro was prescribed for 5 days. Discharge white count was normal, hemoglobin was 9.2. She did exhibit mild hyperglycemia with glucoses up to 136. Renal function was normal. Dementia was felt to be at baseline. 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 She had a silent MI in 2013. History includes GERD, depression, anxiety. The original hip arthroplasty was performed in 2016. Patient also had vaginal hysterectomy. Former smoker, nondrinker. No family history listed and the patient cannot  provide any information.  Review of systems:Could not be completed due to altered mental status.   Physical exam:  Pertinent or positive findings:The patient is profoundly lethargic and nonverbal. Intermittently she will attempt to sit up only to fall back. Intermittently she was also noted to be apneic She resisted opening of the eyes for exam. Visualization of the oropharynx was poor. Teeth are coated Heart rhythm is slow & regular. Breath sounds are decreased when se was breathing. RLE is in a soft cast. Pedal pulses are decreased. She has interosseous wasting of the hands.There is a faint vertical bruise over the right cheek and a small darker bruise over the right chin. There is irregular bruising over the forearms.  General appearance:Thin but adequately nourished; no acute distress , increased work of breathing is present.   Lymphatic: No lymphadenopathy about the head, neck, axilla . Eyes: No lid edema is present. There is no apparent scleral icterus but exam limited. Ears:  External ear exam shows no significant lesions or deformities.   Nose:  External nasal examination shows no deformity or inflammation. Nasal mucosa are pink and moist without lesions ,exudates Oral exam: lips and gums are healthy appearing. Neck:  No thyromegaly, masses, tenderness noted.    Heart:  No gallop, murmur, click, rub .  Lungs: without wheezes, rhonchi,rales , rubs. Abdomen:Bowel sounds are normal. Abdomen is soft and nontender with no organomegaly, hernias,masses. GU: deferred  Extremities:  No cyanosis, clubbing,edema  Neurologic exam : Balance,Rhomberg,finger to nose testing could not be completed due to clinical state Skin: Warm & dry w/o tenting. No significant rash.  See clinical summary under each active problem in the Problem List with associated updated therapeutic plan

## 2016-12-25 ENCOUNTER — Encounter: Payer: Self-pay | Admitting: Adult Health

## 2016-12-25 ENCOUNTER — Non-Acute Institutional Stay (SKILLED_NURSING_FACILITY): Payer: Medicare Other | Admitting: Adult Health

## 2016-12-25 DIAGNOSIS — E43 Unspecified severe protein-calorie malnutrition: Secondary | ICD-10-CM | POA: Diagnosis not present

## 2016-12-25 DIAGNOSIS — F0150 Vascular dementia without behavioral disturbance: Secondary | ICD-10-CM | POA: Diagnosis not present

## 2016-12-25 DIAGNOSIS — Z96649 Presence of unspecified artificial hip joint: Secondary | ICD-10-CM | POA: Diagnosis not present

## 2016-12-25 DIAGNOSIS — M978XXA Periprosthetic fracture around other internal prosthetic joint, initial encounter: Secondary | ICD-10-CM

## 2016-12-25 NOTE — Progress Notes (Signed)
Location:   [facility] Room Number: 160 B Place of Service:  SNF (31)    CODE STATUS: DNR  Allergies  Allergen Reactions  . Penicillins Rash    Chief Complaint  Patient presents with  . Discharge Note    Discharge    HPI:  She is being discharged to another SNF in order to be closer to her family. She will not need any home health or dme ordered. She will follow up medically with the provider at the receiving facility.  She had been hospitalized for a right hip fracture.    Past Medical History:  Diagnosis Date  . Anemia   . Anxiety   . Depression   . GERD (gastroesophageal reflux disease)   . History of blood transfusion 12/16/2016  . Myocardial infarction [facility]) ?2013   "silent"  . Stroke [facility]) 07/2014   "vascular dementia from undetected strokes/MD report" (12/16/2016  . Vascular dementia     Past Surgical History:  Procedure Laterality Date  . CATARACT EXTRACTION W/ INTRAOCULAR LENS IMPLANT     "? side"  . FRACTURE SURGERY    . HIP ARTHROPLASTY Right 02/25/2015   Procedure: ARTHROPLASTY BIPOLAR HIP (HEMIARTHROPLASTY);  Surgeon: Eldred Manges, MD;  Location: [facility] OR;  Service: Orthopedics;  Laterality: Right;  . TOTAL HIP REVISION Right 12/14/2016   Procedure: RIGHT HIP HEMIARTHROPLASTY REVISION WITH CABLE PLACEMENT;  Surgeon: Eldred Manges, MD;  Location: [facility] OR;  Service: Orthopedics;  Laterality: Right;  Marland Kitchen VAGINAL HYSTERECTOMY      Social History   Social History  . Marital status: Widowed    Spouse name: N/A  . Number of children: N/A  . Years of education: N/A   Occupational History  . Not on file.   Social History Main Topics  . Smoking status: Former Games developer  . Smokeless tobacco: Never Used     Comment: "social smoker; quit in the 1970s"  . Alcohol use No  . Drug use: No  . Sexual activity: Not on file   Other Topics Concern  . Not on file   Social History Narrative  . No narrative on file   Family History  Problem Relation  Age of Onset  . Family history unknown: Yes    VITAL SIGNS BP 138/78   Pulse 78   Temp 97 F (36.1 C)   Resp 18   Wt 97 lb 3.2 oz (44.1 kg)   SpO2 94%   BMI 17.78 kg/m   Patient's Medications  New Prescriptions   No medications on file  Previous Medications   ACETAMINOPHEN (TYLENOL) 325 MG TABLET    Take 2 tablets (650 mg total) by mouth every 6 (six) hours as needed for mild pain (or Fever >/= 101).   AMINO ACIDS-PROTEIN HYDROLYS (FEEDING SUPPLEMENT, PRO-STAT SUGAR FREE 64,) LIQD    Take 30 mLs by mouth 2 (two) times daily.   ASPIRIN EC 325 MG EC TABLET    Take 1 tablet (325 mg total) by mouth daily with breakfast.   CLONAZEPAM (KLONOPIN) 0.25 MG DISINTEGRATING TABLET    Take 1 tablet (0.25 mg total) by mouth every 12 (twelve) hours. X 5 days   DOCUSATE SODIUM (COLACE) 100 MG CAPSULE    Take 1 capsule (100 mg total) by mouth 2 (two) times daily.   NUTRITIONAL SUPPLEMENTS (NUTRITIONAL SUPPLEMENT PO)    Take by mouth. House 2.0 - give orally two times daily between meals   POLYETHYLENE GLYCOL (MIRALAX / GLYCOLAX) PACKET  Take 17 g by mouth at bedtime.   RANITIDINE (ZANTAC) 150 MG TABLET    Take 150 mg by mouth 2 (two) times daily.   SODIUM PHOSPHATE (FLEET) 7-19 GM/118ML ENEM    Place 133 mLs (1 enema total) rectally daily as needed for severe constipation.   TRAMADOL (ULTRAM) 50 MG TABLET    Take by mouth every 8 (eight) hours as needed.   TRAZODONE (DESYREL) 50 MG TABLET    Take 1 tablet (50 mg total) by mouth at bedtime as needed for sleep.   UNABLE TO FIND    HSG Regular Diet - HSG Mech Soft Texture  Modified Medications   No medications on file  Discontinued Medications   HYDROCODONE-ACETAMINOPHEN (NORCO/VICODIN) 5-325 MG TABLET    Take 1 tablet by mouth every 6 (six) hours as needed for moderate pain.     SIGNIFICANT DIAGNOSTIC EXAMS  12-13-16 right femur x-ray: No acute abnormality noted.  12-13-16: left femur x-ray: No acute abnormality seen.  12-13-16: pelvic x-ray:  No acute abnormality noted.  12-13-16: chest x-ray: T9 compression deformity of uncertain chronicity. Chronic changes without other acute abnormality.  12-13-16: ct of head, maxillofacial, cervical spine x-ray: 1. No evidence of acute intracranial abnormality. No evidence of calvarial fracture . 2. Moderate chronic small vessel ischemia and generalized cerebral volume loss. 3. Mild right cheek contusion.  No maxillofacial fracture. 4. Chronic paranasal sinusitis. 5. No cervical spine fracture or subluxation. 6. Mild-to-moderate degenerative changes in the cervical spine as detailed .  12-13-16: right hip x-ray: Right hip hemiarthroplasty with periprosthetic nondisplaced acute fractures. Probable associated intramuscular hematoma in the anterior compartment.   LABS REVIEWED:   12-13-16: wbc 11.8; hgb 10.3; hct 31.1; mcv 91.5; plt 318; glucose 118; bun 17; creat 0.81; k+ 4.0 na++ 135; liver normal albumin 3.4; urine culture: klebsiella pneumoniae 12-15-16; wbc 8.2; hgb 7.3; hct 22.0; mcv 90.5; plt 254; glucose 136; bun 13; creat 0.81; k+ 4.2; na++ 137 12-17-16: wbc 9.0; hgb 9.2; hct 28.3; mcv 91.6; plt 256    Review of Systems  Unable to perform ROS: Dementia      Physical Exam  Constitutional: No distress.  Frail   Eyes: Conjunctivae are normal.  Neck: Neck supple. No JVD present. No thyromegaly present.  Cardiovascular: Normal rate, regular rhythm and intact distal pulses.   Respiratory: Effort normal and breath sounds normal. No respiratory distress. She has no wheezes.  GI: Soft. Bowel sounds are normal. She exhibits no distension. There is no tenderness.  Musculoskeletal: She exhibits no edema.  Able to move all extremities  Is status post right hip fracture has knee immobilizer in place    Lymphadenopathy:    She has no cervical adenopathy.  Neurological: She is alert.  Skin: Skin is warm and dry. She is not diaphoretic.  Right hip incision line without signs of infection  present   Psychiatric:  Is anxious      ASSESSMENT/ PLAN:  Patient is being discharged with the following home health services:  None required  Patient is being discharged with the following durable medical equipment:  None needed   Patient has been advised to f/u with their PCP in 1-2 weeks to bring them up to date on their rehab stay.  Social services at facility was responsible for arranging this appointment.  Pt was provided with a 30 day supply of prescriptions for medications and refills must be obtained from their PCP.  For controlled substances, a more limited supply may be provided  adequate until PCP appointment only. #90 ultram 50 mg tabs;    Time spent with patient 40   minutes >50% time spent counseling; reviewing medical record; tests; labs; and developing future plan of care   Synthia Innocent NP [facility]  Contact [phone] Monday through Friday 8am- 5pm  After hours call [phone]

## 2017-01-05 ENCOUNTER — Inpatient Hospital Stay (INDEPENDENT_AMBULATORY_CARE_PROVIDER_SITE_OTHER): Payer: Medicare Other | Admitting: Orthopaedic Surgery

## 2017-01-06 ENCOUNTER — Encounter (INDEPENDENT_AMBULATORY_CARE_PROVIDER_SITE_OTHER): Payer: Self-pay | Admitting: Specialist

## 2017-01-06 ENCOUNTER — Ambulatory Visit (INDEPENDENT_AMBULATORY_CARE_PROVIDER_SITE_OTHER): Payer: Medicare Other | Admitting: Specialist

## 2017-01-06 ENCOUNTER — Ambulatory Visit (INDEPENDENT_AMBULATORY_CARE_PROVIDER_SITE_OTHER): Payer: Medicare Other

## 2017-01-06 VITALS — BP 130/71 | HR 68

## 2017-01-06 DIAGNOSIS — Z96641 Presence of right artificial hip joint: Secondary | ICD-10-CM | POA: Diagnosis not present

## 2017-01-06 NOTE — Progress Notes (Signed)
   Post-Op Visit Note   Patient: Jill [name]           Date of Birth: 1928/12/27           MRN: [id number] Visit Date: 01/06/2017 PCP: Marcell Angerlark, Candice P, NP   Assessment & Plan:  Chief Complaint:  Chief Complaint  Patient presents with  . Right Hip - Routine Post Op   Patient has history of dementia. Daughter is present. Overall states that she is doing well with her rehabilitation. Plan is for discharge home from skilled facility today.   Visit Diagnoses:  1. S/P hip replacement, right     Plan:discharge from skilled facility today. Will continue PT protocol. Follow-up in 6 weeks for recheck. Return sooner if needed.      Follow-Up Instructions: Return in about 6 weeks (around 02/17/2017).   Orders:  Orders Placed This Encounter  Procedures  . XR HIP UNILAT W OR W/O PELVIS 2-3 VIEWS RIGHT   No orders of the defined types were placed in this encounter.   Imaging: No results found.  PMFS History: Patient Active Problem List   Diagnosis Date Noted  . GERD without esophagitis 12/21/2016  . Slow transit constipation 12/21/2016  . Protein-calorie malnutrition (HCC) 12/21/2016  . Hip fracture (HCC) 12/14/2016  . Peri-prosthetic femoral shaft fracture 12/14/2016  . UTI (urinary tract infection) 02/28/2015  . Protein-calorie malnutrition, severe (HCC) 02/27/2015  . Fall   . Fracture of femoral neck, right (HCC) 02/25/2015  . Diastolic dysfunction 02/25/2015  . Hyperlipidemia 02/25/2015  . Dementia, multi-infarct   . TIA (transient ischemic attack) 07/27/2014   Past Medical History:  Diagnosis Date  . Anemia   . Anxiety   . Depression   . GERD (gastroesophageal reflux disease)   . History of blood transfusion 12/16/2016  . Myocardial infarction [facility](H[facility]) ?2013   "silent"  . Stroke [facility](HCC) 07/2014   "vascular dementia from undetected strokes/MD report" (12/16/2016  . Vascular dementia     Family History  Problem Relation Age of Onset  . Family history unknown:  Yes    Past Surgical History:  Procedure Laterality Date  . CATARACT EXTRACTION W/ INTRAOCULAR LENS IMPLANT     "? side"  . FRACTURE SURGERY    . HIP ARTHROPLASTY Right 02/25/2015   Procedure: ARTHROPLASTY BIPOLAR HIP (HEMIARTHROPLASTY);  Surgeon: Eldred MangesMark C Yates, MD;  [facility];  Service: Orthopedics;  Laterality: Right;  . TOTAL HIP REVISION Right 12/14/2016   Procedure: RIGHT HIP HEMIARTHROPLASTY REVISION WITH CABLE PLACEMENT;  Surgeon: Eldred MangesMark C Yates, MD;  Location: [facility] OR;  Service: Orthopedics;  Laterality: Right;  Marland Kitchen. VAGINAL HYSTERECTOMY     Social History   Occupational History  . Not on file.   Social History Main Topics  . Smoking status: Former Games developermoker  . Smokeless tobacco: Never Used     Comment: "social smoker; quit in the 1970s"  . Alcohol use No  . Drug use: No  . Sexual activity: Not on file   Exam Patient does not respond to questions. History of dementia. The wound looks good. Staples removed. Steri-Strips applied. No drainage or signs of infection. Bilateral calves are nontender.

## 2017-01-07 ENCOUNTER — Telehealth (INDEPENDENT_AMBULATORY_CARE_PROVIDER_SITE_OTHER): Payer: Self-pay | Admitting: Radiology

## 2017-01-07 NOTE — Telephone Encounter (Signed)
Patient was seen in the office yesterday morning. Her daughter picked her up at the facility yesterday and the patient was discharged home with her. She noticed after removing her mom's slacks last night, that her knee seemed swollen and a little warm to the touch. She also states that she won't bend it all of the way. Eber JonesCarolyn did not know if during her exam yesterday her knee was looked at.  She would like to know if there is something that she would need to do.   I did advise that some swelling may be expected only 3 weeks out from surgery, especially if her leg had been down. She denies that the patient has a fever.   Please advise.

## 2017-01-12 NOTE — Telephone Encounter (Signed)
I agree with what you said. Continue elevation. Swelling can be expected this soon after surgery.

## 2017-02-19 ENCOUNTER — Ambulatory Visit (INDEPENDENT_AMBULATORY_CARE_PROVIDER_SITE_OTHER): Payer: Medicare Other | Admitting: Specialist

## 2017-02-25 ENCOUNTER — Ambulatory Visit (INDEPENDENT_AMBULATORY_CARE_PROVIDER_SITE_OTHER): Payer: Medicare Other | Admitting: Specialist

## 2017-03-09 ENCOUNTER — Ambulatory Visit (INDEPENDENT_AMBULATORY_CARE_PROVIDER_SITE_OTHER): Payer: Medicare Other | Admitting: Orthopaedic Surgery

## 2017-03-10 ENCOUNTER — Ambulatory Visit (INDEPENDENT_AMBULATORY_CARE_PROVIDER_SITE_OTHER): Payer: Medicare Other

## 2017-03-10 ENCOUNTER — Encounter (INDEPENDENT_AMBULATORY_CARE_PROVIDER_SITE_OTHER): Payer: Self-pay | Admitting: Orthopaedic Surgery

## 2017-03-10 ENCOUNTER — Ambulatory Visit (INDEPENDENT_AMBULATORY_CARE_PROVIDER_SITE_OTHER): Payer: Medicare Other | Admitting: Orthopaedic Surgery

## 2017-03-10 VITALS — Ht 62.0 in | Wt 97.0 lb

## 2017-03-10 DIAGNOSIS — M978XXA Periprosthetic fracture around other internal prosthetic joint, initial encounter: Secondary | ICD-10-CM

## 2017-03-10 DIAGNOSIS — M25551 Pain in right hip: Secondary | ICD-10-CM

## 2017-03-10 DIAGNOSIS — Z96649 Presence of unspecified artificial hip joint: Secondary | ICD-10-CM

## 2017-03-12 NOTE — Progress Notes (Signed)
Office Visit Note   Patient: Jill [name]           Date of Birth: Feb 24, 1929           MRN: [id number] Visit Date: 03/10/2017              Requested by: Marcell Anger, NP [address] [place], [place] [address] PCP: Marcell Anger, NP   Assessment & Plan: Visit Diagnoses:  1. Pain in right hip   2. Peri-prosthetic femoral shaft fracture     Plan: Satisfactory cable fixation for periprosthetic fracture with hemiarthroplasty right hip. She has severe dementia. We'll check her back again on a when necessary basis. Her daughter is very happy with her mobility and pain relief.  Follow-Up Instructions: Return if symptoms worsen or fail to improve.   Orders:  Orders Placed This Encounter  Procedures  . XR HIP UNILAT W OR W/O PELVIS 2-3 VIEWS RIGHT   No orders of the defined types were placed in this encounter.     Procedures: No procedures performed   Clinical Data: No additional findings.   Subjective: Chief Complaint  Patient presents with  . Right Hip - Follow-up    HPI 83 year old female with dementia post periprosthetic fracture with cable fixation for hemiarthroplasty. She is here with her daughter seems to be moving well she still has severe dementia but appears to be in good spirits. Hip incision is well-healed. She is a mature with a walker.  Review of Systems review of systems is updated 14 point and is unchanged from 01/06/2017 office visit.   Objective: Vital Signs: Ht 5\' 2"  (1.575 m)   Wt 97 lb (44 kg)   BMI 17.74 kg/m   Physical Exam  Constitutional: She is oriented to person, place, and time. She appears well-developed.  HENT:  Head: Normocephalic.  Right Ear: External ear normal.  Left Ear: External ear normal.  Eyes: Pupils are equal, round, and reactive to light.  Neck: No tracheal deviation present. No thyromegaly present.  Cardiovascular: Normal rate.   Pulmonary/Chest: Effort normal.  Abdominal: Soft.    Musculoskeletal:  Hip incision is healed. No tenderness no erythema.  Neurological: She is alert and oriented to person, place, and time.  Skin: Skin is warm and dry.  Psychiatric:  Confused, dementia, and good spirits. She can shake my hand.    Ortho Exam patient has no pain with hip range of motion.  Specialty Comments:  No specialty comments available.  Imaging: X-ray of the pelvis and hips shows the hemiarthroplasty in satisfactory condition with cable fixation. Hip as well reduced no soft tissue calcification.   PMFS History: Patient Active Problem List   Diagnosis Date Noted  . GERD without esophagitis 12/21/2016  . Slow transit constipation 12/21/2016  . Protein-calorie malnutrition (HCC) 12/21/2016  . Hip fracture (HCC) 12/14/2016  . Peri-prosthetic femoral shaft fracture 12/14/2016  . UTI (urinary tract infection) 02/28/2015  . Protein-calorie malnutrition, severe (HCC) 02/27/2015  . Fall   . Fracture of femoral neck, right (HCC) 02/25/2015  . Diastolic dysfunction 02/25/2015  . Hyperlipidemia 02/25/2015  . Dementia, multi-infarct   . TIA (transient ischemic attack) 07/27/2014   Past Medical History:  Diagnosis Date  . Anemia   . Anxiety   . Depression   . GERD (gastroesophageal reflux disease)   . History of blood transfusion 12/16/2016  . Myocardial infarction [facility]) ?2013   "silent"  . Stroke [facility]) 07/2014   "vascular dementia from  undetected strokes/MD report" (12/16/2016  . Vascular dementia     Family History  Problem Relation Age of Onset  . Family history unknown: Yes    Past Surgical History:  Procedure Laterality Date  . CATARACT EXTRACTION W/ INTRAOCULAR LENS IMPLANT     "? side"  . FRACTURE SURGERY    . HIP ARTHROPLASTY Right 02/25/2015   Procedure: ARTHROPLASTY BIPOLAR HIP (HEMIARTHROPLASTY);  Surgeon: Eldred MangesMark C Gale Hulse, MD;  [facility];  Service: Orthopedics;  Laterality: Right;  . TOTAL HIP REVISION Right 12/14/2016   Procedure: RIGHT  HIP HEMIARTHROPLASTY REVISION WITH CABLE PLACEMENT;  Surgeon: Eldred MangesMark C Thaxton Pelley, MD;  Location: [facility] OR;  Service: Orthopedics;  Laterality: Right;  Marland Kitchen. VAGINAL HYSTERECTOMY     Social History   Occupational History  . Not on file.   Social History Main Topics  . Smoking status: Former Games developermoker  . Smokeless tobacco: Never Used     Comment: "social smoker; quit in the 1970s"  . Alcohol use No  . Drug use: No  . Sexual activity: Not on file

## 2017-04-05 ENCOUNTER — Other Ambulatory Visit (INDEPENDENT_AMBULATORY_CARE_PROVIDER_SITE_OTHER): Payer: Self-pay | Admitting: Orthopaedic Surgery

## 2017-04-06 ENCOUNTER — Telehealth (INDEPENDENT_AMBULATORY_CARE_PROVIDER_SITE_OTHER): Payer: Self-pay | Admitting: Radiology

## 2017-04-06 NOTE — Telephone Encounter (Signed)
Patient daughter Jerolyn CenterCarolyn Bale left a voicemail on triage line. She states that her mother fell last week, did not notice any changes until this weekend. There is swelling in right knee, she complains of heat. There is no relief with icing. Hx of right hip surgery back in April of this year. She is requesting an appointment with Dr. Ophelia CharterYates. I called back and left a voicemail for her daughter advising her to bring in patient tomorrow morning to [place] to see Dr. Ophelia CharterYates at 930pm.

## 2017-04-07 ENCOUNTER — Ambulatory Visit (INDEPENDENT_AMBULATORY_CARE_PROVIDER_SITE_OTHER): Payer: Medicare Other

## 2017-04-07 ENCOUNTER — Ambulatory Visit (INDEPENDENT_AMBULATORY_CARE_PROVIDER_SITE_OTHER): Payer: Medicare Other | Admitting: Orthopaedic Surgery

## 2017-04-07 DIAGNOSIS — M25561 Pain in right knee: Secondary | ICD-10-CM

## 2017-04-07 NOTE — Progress Notes (Signed)
Office Visit Note   Patient: Jill HazardVirginia Balingit           Date of Birth: [birth year]           MRN: [id number] Visit Date: 04/07/2017              Requested by: Marcell Angerlark, Candice P, NP [address] [place], [place] [id number] PCP: Marcell Angerlark, Candice P, NP   Assessment & Plan: Visit Diagnoses:  1. Acute pain of right knee       With contusion medial femoral condyle after a fall.  Plan: X-rays negative for fracture of her hip and knee. She has some ecchymosis from her contusion of the medial femoral condyle [place] gradually increase her walking activity as her pain decreases. Follow-up as needed. Exposure reviewed with her daughter.  Follow-Up Instructions: No Follow-up on file.   Orders:  Orders Placed This Encounter  Procedures  . XR Knee 1-2 Views Right   No orders of the defined types were placed in this encounter.     Procedures: No procedures performed   Clinical Data: No additional findings.   Subjective: No chief complaint on file.   HPI 83 year old female had 2 falls with the ecchymosis of her right knee she's been walking a little bit better. Due to her dementia she is nonverbal. She is here with her daughter takes care of her.  Review of Systems view of systems is unchanged from surgery a few months ago 14 point is updated and is unchanged other than the falls as listed above.   Objective: Vital Signs: There were no vitals taken for this visit.  Physical Exam  Constitutional: She is oriented to person, place, and time. She appears well-developed.  HENT:  Head: Normocephalic.  Right Ear: External ear normal.  Left Ear: External ear normal.  Eyes: Pupils are equal, round, and reactive to light.  Neck: No tracheal deviation present. No thyromegaly present.  Cardiovascular: Normal rate.   Pulmonary/Chest: Effort normal.  Abdominal: Soft.  Musculoskeletal:  Ecchymosis over the right knee medially there is some mild knee effusion. Slight opening  medial collateral ligament. Knee reaches the 100 flexion reaches out full extension. Anterior cruciate ligament PCL exam is normal.  Neurological: She is alert and oriented to person, place, and time.  Skin: Skin is warm and dry.  Psychiatric: She has a normal mood and affect. Her behavior is normal.    Ortho Exam  Specialty Comments:  No specialty comments available.  Imaging: No results found.   PMFS History: Patient Active Problem List   Diagnosis Date Noted  . GERD without esophagitis 12/21/2016  . Slow transit constipation 12/21/2016  . Protein-calorie malnutrition (HCC) 12/21/2016  . Hip fracture (HCC) 12/14/2016  . Peri-prosthetic femoral shaft fracture 12/14/2016  . UTI (urinary tract infection) 02/28/2015  . Protein-calorie malnutrition, severe (HCC) 02/27/2015  . Fall   . Fracture of femoral neck, right (HCC) 02/25/2015  . Diastolic dysfunction 02/25/2015  . Hyperlipidemia 02/25/2015  . Dementia, multi-infarct   . TIA (transient ischemic attack) 07/27/2014   Past Medical History:  Diagnosis Date  . Anemia   . Anxiety   . Depression   . GERD (gastroesophageal reflux disease)   . History of blood transfusion 12/16/2016  . Myocardial infarction [facility](HCC) ?2013   "silent"  . Stroke [facility](HCC) 07/2014   "vascular dementia from undetected strokes/MD report" (12/16/2016  . Vascular dementia     Family History  Problem Relation Age of Onset  .  Family history unknown: Yes    Past Surgical History:  Procedure Laterality Date  . CATARACT EXTRACTION W/ INTRAOCULAR LENS IMPLANT     "? side"  . FRACTURE SURGERY    . HIP ARTHROPLASTY Right 02/25/2015   Procedure: ARTHROPLASTY BIPOLAR HIP (HEMIARTHROPLASTY);  Surgeon: Eldred Manges, MD;  Location: [facility] OR;  Service: Orthopedics;  Laterality: Right;  . TOTAL HIP REVISION Right 12/14/2016   Procedure: RIGHT HIP HEMIARTHROPLASTY REVISION WITH CABLE PLACEMENT;  Surgeon: Eldred Manges, MD;  [facility] OR;  Service: Orthopedics;   Laterality: Right;  Marland Kitchen VAGINAL HYSTERECTOMY     Social History   Occupational History  . Not on file.   Social History Main Topics  . Smoking status: Former Games developer  . Smokeless tobacco: Never Used     Comment: "social smoker; quit in the 1970s"  . Alcohol use No  . Drug use: No  . Sexual activity: Not on file

## 2018-04-17 ENCOUNTER — Emergency Department (HOSPITAL_COMMUNITY): Payer: Medicare Other

## 2018-04-17 ENCOUNTER — Emergency Department (HOSPITAL_COMMUNITY)
Admission: EM | Admit: 2018-04-17 | Discharge: 2018-04-18 | Disposition: A | Discharge status: Home / Self Care | Payer: Medicare Other | Attending: Emergency Medicine | Admitting: Emergency Medicine

## 2018-04-17 DIAGNOSIS — F0150 Vascular dementia without behavioral disturbance: Secondary | ICD-10-CM | POA: Diagnosis not present

## 2018-04-17 DIAGNOSIS — Y999 Unspecified external cause status: Secondary | ICD-10-CM | POA: Diagnosis not present

## 2018-04-17 DIAGNOSIS — Y9301 Activity, walking, marching and hiking: Secondary | ICD-10-CM | POA: Insufficient documentation

## 2018-04-17 DIAGNOSIS — X500XXA Overexertion from strenuous movement or load, initial encounter: Secondary | ICD-10-CM | POA: Diagnosis not present

## 2018-04-17 DIAGNOSIS — S43014A Anterior dislocation of right humerus, initial encounter: Secondary | ICD-10-CM | POA: Insufficient documentation

## 2018-04-17 DIAGNOSIS — Z79899 Other long term (current) drug therapy: Secondary | ICD-10-CM | POA: Insufficient documentation

## 2018-04-17 DIAGNOSIS — S43004A Unspecified dislocation of right shoulder joint, initial encounter: Secondary | ICD-10-CM

## 2018-04-17 DIAGNOSIS — S4991XA Unspecified injury of right shoulder and upper arm, initial encounter: Secondary | ICD-10-CM | POA: Diagnosis present

## 2018-04-17 DIAGNOSIS — Z87891 Personal history of nicotine dependence: Secondary | ICD-10-CM | POA: Insufficient documentation

## 2018-04-17 DIAGNOSIS — Y9289 Other specified places as the place of occurrence of the external cause: Secondary | ICD-10-CM | POA: Insufficient documentation

## 2018-04-17 NOTE — ED Triage Notes (Signed)
Per EMS, Pt from home w/ family was going up the stairs and grabbed the railing.  Somehow she turned and dislocated her shoulder.  Hx of dementia, was given 75 fentanyl which caused her to destat, placed on 2L O2, returned to WNL.  Hx of dementia

## 2018-04-18 ENCOUNTER — Emergency Department (HOSPITAL_COMMUNITY): Payer: Medicare Other

## 2018-04-18 DIAGNOSIS — S43014A Anterior dislocation of right humerus, initial encounter: Secondary | ICD-10-CM | POA: Diagnosis not present

## 2018-04-18 MED ORDER — PROPOFOL 10 MG/ML IV BOLUS
0.5000 mg/kg | Freq: Once | INTRAVENOUS | Status: DC
  Filled 2018-04-18: qty 20

## 2018-04-18 MED ORDER — PROPOFOL 10 MG/ML IV BOLUS
INTRAVENOUS | Status: AC | PRN
  Administered 2018-04-18: 15 mg via INTRAVENOUS
  Administered 2018-04-18: 20.9 mg via INTRAVENOUS
  Administered 2018-04-18: 15 mg via INTRAVENOUS

## 2018-04-18 NOTE — ED Provider Notes (Signed)
[facility] Provider Note   CSN: [id number] Arrival date & time: 04/17/18  2336     History   Chief Complaint Chief Complaint  Patient presents with  . Shoulder Injury    HPI [name] is a 84 y.o. female.  Patient presents to the emergency department for evaluation of right shoulder injury.  Patient was walking up steps, holding onto the railing when she twisted her arm backwards and immediately had pain.  There was no fall or direct injury to the shoulder.  Patient complaining of severe and constant pain in the right shoulder with decreased range of motion.  Family reports that she has not had previous injury to the shoulder.     Past Medical History:  Diagnosis Date  . Anemia   . Anxiety   . Depression   . GERD (gastroesophageal reflux disease)   . History of blood transfusion 12/16/2016  . Myocardial infarction [facility](HCC) ?2013   "silent"  . Stroke [facility](HCC) 07/2014   "vascular dementia from undetected strokes/MD report" (12/16/2016  . Vascular dementia     Patient Active Problem List   Diagnosis Date Noted  . GERD without esophagitis 12/21/2016  . Slow transit constipation 12/21/2016  . Protein-calorie malnutrition (HCC) 12/21/2016  . Hip fracture (HCC) 12/14/2016  . Peri-prosthetic femoral shaft fracture 12/14/2016  . UTI (urinary tract infection) 02/28/2015  . Protein-calorie malnutrition, severe (HCC) 02/27/2015  . Fall   . Fracture of femoral neck, right (HCC) 02/25/2015  . Diastolic dysfunction 02/25/2015  . Hyperlipidemia 02/25/2015  . Dementia, multi-infarct   . TIA (transient ischemic attack) 07/27/2014    Past Surgical History:  Procedure Laterality Date  . CATARACT EXTRACTION W/ INTRAOCULAR LENS IMPLANT     "? side"  . FRACTURE SURGERY    . HIP ARTHROPLASTY Right 02/25/2015   Procedure: ARTHROPLASTY BIPOLAR HIP (HEMIARTHROPLASTY);  Surgeon: Eldred MangesMark C Yates, MD;  [facility];  Service: Orthopedics;   Laterality: Right;  . TOTAL HIP REVISION Right 12/14/2016   Procedure: RIGHT HIP HEMIARTHROPLASTY REVISION WITH CABLE PLACEMENT;  Surgeon: Eldred MangesMark C Yates, MD;  Location: [facility] OR;  Service: Orthopedics;  Laterality: Right;  Marland Kitchen. VAGINAL HYSTERECTOMY       OB History   None      Home Medications    Prior to Admission medications   Medication Sig Start Date End Date Taking? Authorizing Provider  acetaminophen (TYLENOL) 325 MG tablet Take 2 tablets (650 mg total) by mouth every 6 (six) hours as needed for mild pain (or Fever >/= 101). 12/17/16   Osvaldo ShipperKrishnan, Gokul, MD  Amino Acids-Protein Hydrolys (FEEDING SUPPLEMENT, PRO-STAT SUGAR FREE 64,) LIQD Take 30 mLs by mouth 2 (two) times daily.    [provider]  aspirin EC 325 MG EC tablet Take 1 tablet (325 mg total) by mouth daily with breakfast. 12/17/16   Osvaldo ShipperKrishnan, Gokul, MD  clonazePAM (KLONOPIN) 0.25 MG disintegrating tablet Take 1 tablet (0.25 mg total) by mouth every 12 (twelve) hours. X 5 days 12/18/16   Renato Gailseed, Tiffany L, DO  docusate sodium (COLACE) 100 MG capsule Take 1 capsule (100 mg total) by mouth 2 (two) times daily. 12/17/16   Osvaldo ShipperKrishnan, Gokul, MD  Nutritional Supplements (NUTRITIONAL SUPPLEMENT PO) Take by mouth. House 2.0 - give orally two times daily between meals    [provider]  polyethylene glycol (MIRALAX / GLYCOLAX) packet Take 17 g by mouth at bedtime.    [provider]  ranitidine (ZANTAC) 150 MG tablet Take  150 mg by mouth 2 (two) times daily.    [provider]  sodium phosphate (FLEET) 7-19 GM/118ML ENEM Place 133 mLs (1 enema total) rectally daily as needed for severe constipation. 12/17/16   Osvaldo Shipper, MD  traMADol (ULTRAM) 50 MG tablet Take by mouth every 8 (eight) hours as needed.    [provider]  traZODone (DESYREL) 50 MG tablet Take 1 tablet (50 mg total) by mouth at bedtime as needed for sleep. 12/17/16   Osvaldo Shipper, MD  UNABLE TO FIND HSG Regular Diet - HSG Mech Soft  Texture    [provider]    Family History Family History  Family history unknown: Yes    Social History Social History   Tobacco Use  . Smoking status: Former Games developer  . Smokeless tobacco: Never Used  . Tobacco comment: "social smoker; quit in the 1970s"  Substance Use Topics  . Alcohol use: No  . Drug use: No     Allergies   Penicillins   Review of Systems Review of Systems  Musculoskeletal: Positive for arthralgias.  All other systems reviewed and are negative.    Physical Exam Updated Vital Signs BP (!) 165/72   Pulse 62   Temp 97.6 F (36.4 C) (Tympanic)   Resp 14   Ht 5\' 1"  (1.549 m)   Wt 41.7 kg   SpO2 100%   BMI 17.38 kg/m   Physical Exam  Constitutional: She appears well-developed and well-nourished. No distress.  HENT:  Head: Normocephalic and atraumatic.  Right Ear: Hearing normal.  Left Ear: Hearing normal.  Nose: Nose normal.  Mouth/Throat: Oropharynx is clear and moist and mucous membranes are normal.  Eyes: Pupils are equal, round, and reactive to light. Conjunctivae and EOM are normal.  Neck: Normal range of motion. Neck supple.  Cardiovascular: Regular rhythm, S1 normal and S2 normal. Exam reveals no gallop and no friction rub.  No murmur heard. Pulmonary/Chest: Effort normal and breath sounds normal. No respiratory distress. She exhibits no tenderness.  Abdominal: Soft. Normal appearance and bowel sounds are normal. There is no hepatosplenomegaly. There is no tenderness. There is no rebound, no guarding, no tenderness at McBurney's point and negative Murphy's sign. No hernia.  Musculoskeletal:       Right shoulder: She exhibits decreased range of motion, tenderness and deformity.  Neurological: She is alert. She has normal strength. She is disoriented. No cranial nerve deficit or sensory deficit. Coordination normal. GCS eye subscore is 4. GCS verbal subscore is 5. GCS motor subscore is 6.  Skin: Skin is warm, dry and intact.  No rash noted. No cyanosis.  Psychiatric: She has a normal mood and affect. Her speech is normal and behavior is normal. Thought content normal.  Nursing note and vitals reviewed.    ED Treatments / Results  Labs (all labs ordered are listed, but only abnormal results are displayed) Labs Reviewed - No data to display  EKG None  Radiology Dg Shoulder Right Portable  Result Date: 04/18/2018 CLINICAL DATA:  Initial evaluation status post reduction. EXAM: PORTABLE RIGHT SHOULDER COMPARISON:  Prior radiograph from 04/17/2018 FINDINGS: Right humeral head now in normal anatomic alignment with the glenoid status post reduction. No appreciable fracture. AC joint approximated. No new abnormality. IMPRESSION: Right humeral head in anatomic alignment with the glenoid status post reduction. Electronically Signed   By: Rise Mu M.D.   On: 04/18/2018 01:23   Dg Shoulder Right Portable  Result Date: 04/17/2018 CLINICAL DATA:  Right shoulder  pain EXAM: PORTABLE RIGHT SHOULDER COMPARISON:  None. FINDINGS: AC joint is intact. Anterior dislocation of the humeral head with respect to the glenoid fossa. No gross fracture abnormality. IMPRESSION: Anterior dislocation of the humeral head with respect to the glenoid fossa Electronically Signed   By: Jasmine PangKim  Fujinaga M.D.   On: 04/17/2018 23:54    Procedures .Sedation Date/Time: 04/18/2018 1:42 AM Performed by: Gilda CreasePollina, Nghia Mcentee J, MD Authorized by: Gilda CreasePollina, Herron Fero J, MD   Consent:    Consent obtained:  Verbal   Consent given by:  Patient   Risks discussed:  Allergic reaction, dysrhythmia, inadequate sedation, nausea, prolonged hypoxia resulting in organ damage, prolonged sedation necessitating reversal, respiratory compromise necessitating ventilatory assistance and intubation and vomiting   Alternatives discussed:  Analgesia without sedation, anxiolysis and regional anesthesia Universal protocol:    Procedure explained and questions  answered to patient or proxy's satisfaction: yes     Relevant documents present and verified: yes     Test results available and properly labeled: yes     Imaging studies available: yes     Required blood products, implants, devices, and special equipment available: yes     Site/side marked: yes     Immediately prior to procedure a time out was called: yes     Patient identity confirmation method:  Verbally with patient Indications:    Procedure necessitating sedation performed by:  Physician performing sedation   Intended level of sedation:  Deep Pre-sedation assessment:    Time since last food or drink:  4   ASA classification: class 2 - patient with mild systemic disease     Neck mobility: normal     Mouth opening:  3 or more finger widths   Thyromental distance:  4 finger widths   Mallampati score:  I - soft palate, uvula, fauces, pillars visible   Pre-sedation assessments completed and reviewed: airway patency, cardiovascular function, hydration status, mental status, nausea/vomiting, pain level, respiratory function and temperature   Immediate pre-procedure details:    Reassessment: Patient reassessed immediately prior to procedure     Reviewed: vital signs, relevant labs/tests and NPO status     Verified: bag valve mask available, emergency equipment available, intubation equipment available, IV patency confirmed, oxygen available and suction available   Procedure details (see MAR for exact dosages):    Preoxygenation:  Nasal cannula   Sedation:  Propofol   Intra-procedure monitoring:  Blood pressure monitoring, cardiac monitor, continuous pulse oximetry, frequent LOC assessments, frequent vital sign checks and continuous capnometry   Intra-procedure events: none     Total Provider sedation time (minutes):  20 Post-procedure details:    Attendance: Constant attendance by certified staff until patient recovered     Recovery: Patient returned to pre-procedure baseline      Post-sedation assessments completed and reviewed: airway patency, cardiovascular function, hydration status, mental status, nausea/vomiting, pain level, respiratory function and temperature     Patient is stable for discharge or admission: yes     Patient tolerance:  Tolerated well, no immediate complications .Ortho Injury Treatment Date/Time: 04/18/2018 1:42 AM Performed by: Gilda CreasePollina, Skyy Mcknight J, MD Authorized by: Gilda CreasePollina, Kainat Pizana J, MD   Consent:    Consent obtained:  Written   Consent given by:  Healthcare agent   Risks discussed:  Fracture, nerve damage and irreducible dislocation Universal protocol:    Procedure explained and questions answered to patient or proxy's satisfaction: yes     Relevant documents present and verified: yes     Test results  available and properly labeled: yes     Imaging studies available: yes     Required blood products, implants, devices, and special equipment available: yes     Site/side marked: yes     Immediately prior to procedure a time out was called: yes     Patient identity confirmed:  Hospital-assigned identification numberInjury location: shoulder Location details: right shoulder Injury type: dislocation Dislocation type: anterior Hill-Sachs deformity: no Chronicity: new Pre-procedure neurovascular assessment: neurovascularly intact Pre-procedure distal perfusion: normal Pre-procedure neurological function: normal Pre-procedure range of motion: reduced  Anesthesia: Local anesthesia used: no  Patient sedated: Yes. Refer to sedation procedure documentation for details of sedation. Manipulation performed: yes Reduction method: external rotation, scapular manipulation and traction and counter traction Reduction successful: yes X-ray confirmed reduction: yes Immobilization: sling Post-procedure neurovascular assessment: post-procedure neurovascularly intact Post-procedure distal perfusion: normal Post-procedure neurological function:  normal Post-procedure range of motion: improved Patient tolerance: Patient tolerated the procedure well with no immediate complications    (including critical care time)  Medications Ordered in ED Medications  propofol (DIPRIVAN) 10 mg/mL bolus/IV push 20.9 mg (has no administration in time range)  propofol (DIPRIVAN) 10 mg/mL bolus/IV push (15 mg Intravenous Given 04/18/18 0053)     Initial Impression / Assessment and Plan / ED Course  I have reviewed the triage vital signs and the nursing notes.  Pertinent labs & imaging results that were available during my care of the patient were reviewed by me and considered in my medical decision making (see chart for details).     Patient presents with right shoulder dislocation which occurred when she moved her right shoulder while walking up steps and holding onto the banister.  There was no fall or direct injury.  She was neurovascularly intact.  Successful closed reduction was performed in the ER with sedation.  Patient placed in an immobilizer.  She will follow-up with her orthopedic surgeon (has seen Dr. Ophelia Charter previously)  Final Clinical Impressions(s) / ED Diagnoses   Final diagnoses:  Dislocation of right shoulder joint, initial encounter    ED Discharge Orders    None       Gilda Crease, MD 04/18/18 [phone]

## 2018-04-18 NOTE — Progress Notes (Signed)
RT present for conscious sedation. No complications noted. 

## 2018-04-18 NOTE — ED Notes (Addendum)
Pt able to be woken, appears at baseline.  RN called family who stated they were on their way back.

## 2018-04-22 ENCOUNTER — Telehealth (INDEPENDENT_AMBULATORY_CARE_PROVIDER_SITE_OTHER): Payer: Self-pay | Admitting: Surgery

## 2018-04-22 NOTE — Telephone Encounter (Signed)
Patients daughter would like a call back to advise on if her mom can take off the sling on her arm before her appointment on Wednesday. Shes worried that she is confused and will try to mess with it or take it off. [facility] on 04/17/18. Please call Eber JonesCarolyn # [phone]

## 2018-04-22 NOTE — Telephone Encounter (Signed)
I called and spoke with patient's daughter. Advised to keep the sling on until follow up appointment if at all possible.

## 2018-04-27 ENCOUNTER — Ambulatory Visit (INDEPENDENT_AMBULATORY_CARE_PROVIDER_SITE_OTHER): Payer: Medicare Other

## 2018-04-27 ENCOUNTER — Encounter (INDEPENDENT_AMBULATORY_CARE_PROVIDER_SITE_OTHER): Payer: Self-pay | Admitting: Surgery

## 2018-04-27 ENCOUNTER — Ambulatory Visit (INDEPENDENT_AMBULATORY_CARE_PROVIDER_SITE_OTHER): Payer: Medicare Other | Admitting: Surgery

## 2018-04-27 VITALS — Ht 61.0 in | Wt 92.0 lb

## 2018-04-27 DIAGNOSIS — S43004A Unspecified dislocation of right shoulder joint, initial encounter: Secondary | ICD-10-CM | POA: Diagnosis not present

## 2018-04-27 DIAGNOSIS — M25511 Pain in right shoulder: Secondary | ICD-10-CM

## 2018-04-27 NOTE — Progress Notes (Signed)
Office Visit Note   Patient: Jill [name]           Date of Birth: Mar 13, 1929           MRN: [id number] Visit Date: 04/27/2018              Requested by: Marcell Anger, NP [address] [place], [place] [address] PCP: Marcell Anger, NP   Assessment & Plan: Visit Diagnoses:  1. Acute pain of right shoulder   2. Dislocation of right shoulder joint, initial encounter     Plan: Patient will continue wearing the immobilizer.  Per Dr. Ophelia Charter can do some gentle pendulum exercises.  No lifting pushing pulling with right arm.  Patient has a history of dementia so this was discussed with family member.  Recheck in 2 weeks.  Follow-Up Instructions: Return in about 2 weeks (around 05/11/2018) for Dr. Ophelia Charter for recheck.   Orders:  Orders Placed This Encounter  Procedures  . XR Shoulder 1V Right   No orders of the defined types were placed in this encounter.     Procedures: No procedures performed   Clinical Data: No additional findings.   Subjective: Chief Complaint  Patient presents with  . Right Shoulder - Follow-up    04/18/18 s/p dislocation     HPI Patient comes in with family member today.  States that April 17, 2018 she was walking when she lost her balance and grabbed onto an object to catch herself and she injured her right shoulder.  Was seen at the ER and diagnosed with a shoulder dislocation.  This was reduced.  No fracture.  She was put in a shoulder immobilizer.  Patient has a history of dementia. Review of Systems History of dementia.  Objective: Vital Signs: Ht 5\' 1"  (1.549 m)   Wt 92 lb (41.7 kg)   BMI 17.38 kg/m   Physical Exam Mobilizer on.  Moves fingers well.  Neurovascular intact.  Some bruising around shoulder. Ortho Exam  Specialty Comments:  No specialty comments available.  Imaging: Xr Shoulder 1v Right  Result Date: 04/27/2018 X-ray right shoulder is anatomic alignment.  No signs of fracture.    PMFS  History: Patient Active Problem List   Diagnosis Date Noted  . GERD without esophagitis 12/21/2016  . Slow transit constipation 12/21/2016  . Protein-calorie malnutrition (HCC) 12/21/2016  . Hip fracture (HCC) 12/14/2016  . Peri-prosthetic femoral shaft fracture 12/14/2016  . UTI (urinary tract infection) 02/28/2015  . Protein-calorie malnutrition, severe (HCC) 02/27/2015  . Fall   . Fracture of femoral neck, right (HCC) 02/25/2015  . Diastolic dysfunction 02/25/2015  . Hyperlipidemia 02/25/2015  . Dementia, multi-infarct   . TIA (transient ischemic attack) 07/27/2014   Past Medical History:  Diagnosis Date  . Anemia   . Anxiety   . Depression   . GERD (gastroesophageal reflux disease)   . History of blood transfusion 12/16/2016  . Myocardial infarction [facility]) ?2013   "silent"  . Stroke [facility]) 07/2014   "vascular dementia from undetected strokes/MD report" (12/16/2016  . Vascular dementia     Family History  Family history unknown: Yes    Past Surgical History:  Procedure Laterality Date  . CATARACT EXTRACTION W/ INTRAOCULAR LENS IMPLANT     "? side"  . FRACTURE SURGERY    . HIP ARTHROPLASTY Right 02/25/2015   Procedure: ARTHROPLASTY BIPOLAR HIP (HEMIARTHROPLASTY);  Surgeon: Eldred Manges, MD;  Location: [facility] OR;  Service: Orthopedics;  Laterality: Right;  .  TOTAL HIP REVISION Right 12/14/2016   Procedure: RIGHT HIP HEMIARTHROPLASTY REVISION WITH CABLE PLACEMENT;  Surgeon: Eldred MangesMark C Yates, MD;  Location: [facility] OR;  Service: Orthopedics;  Laterality: Right;  Marland Kitchen. VAGINAL HYSTERECTOMY     Social History   Occupational History  . Not on file  Tobacco Use  . Smoking status: Former Games developermoker  . Smokeless tobacco: Never Used  . Tobacco comment: "social smoker; quit in the 1970s"  Substance and Sexual Activity  . Alcohol use: No  . Drug use: No  . Sexual activity: Not on file

## 2018-05-10 ENCOUNTER — Ambulatory Visit (INDEPENDENT_AMBULATORY_CARE_PROVIDER_SITE_OTHER): Payer: Medicare Other | Admitting: Orthopaedic Surgery

## 2018-05-12 ENCOUNTER — Ambulatory Visit (INDEPENDENT_AMBULATORY_CARE_PROVIDER_SITE_OTHER): Payer: Medicare Other | Admitting: Surgery

## 2018-05-12 ENCOUNTER — Encounter (INDEPENDENT_AMBULATORY_CARE_PROVIDER_SITE_OTHER): Payer: Self-pay | Admitting: Surgery

## 2018-05-12 DIAGNOSIS — S43004A Unspecified dislocation of right shoulder joint, initial encounter: Secondary | ICD-10-CM

## 2018-05-12 NOTE — Progress Notes (Signed)
Patient returns with her daughter today.  Patient has a history of dementia.  Status post right shoulder dislocation.  It has been hard to keep patient in sling immobilizer.  Daughter has been keeping a close eye on her to make sure that she does not dislocate her shoulder again.   Exam Patient shoulder is somewhat tender.  She does not allow me to do any range of motion with her shoulder.  Shoulder appears to be in place on exam.  I do not repeat x-ray.   Plan Patient's daughter will continue to keep a close eye on her.  Follow-up in 4 weeks for recheck with Dr. Ophelia CharterYates.  We did discuss that it would be best to use the sling immobilizer but we agree that this is a tough task with patient's dementia and her wanting to take it off.

## 2018-06-10 ENCOUNTER — Ambulatory Visit (INDEPENDENT_AMBULATORY_CARE_PROVIDER_SITE_OTHER): Payer: Medicare Other | Admitting: Orthopaedic Surgery

## 2018-06-22 ENCOUNTER — Ambulatory Visit (INDEPENDENT_AMBULATORY_CARE_PROVIDER_SITE_OTHER): Payer: Medicare Other | Admitting: Orthopaedic Surgery

## 2018-08-22 ENCOUNTER — Emergency Department (HOSPITAL_COMMUNITY): Payer: Medicare Other

## 2018-08-22 ENCOUNTER — Inpatient Hospital Stay (HOSPITAL_COMMUNITY)
Admission: EM | Admit: 2018-08-22 | Discharge: 2018-08-25 | DRG: 689 | Disposition: A | Discharge status: Home Health | Payer: Medicare Other | Attending: Internal Medicine | Admitting: Internal Medicine

## 2018-08-22 DIAGNOSIS — F0150 Vascular dementia without behavioral disturbance: Secondary | ICD-10-CM | POA: Diagnosis present

## 2018-08-22 DIAGNOSIS — I69311 Memory deficit following cerebral infarction: Secondary | ICD-10-CM

## 2018-08-22 DIAGNOSIS — Z66 Do not resuscitate: Secondary | ICD-10-CM | POA: Diagnosis present

## 2018-08-22 DIAGNOSIS — Z87891 Personal history of nicotine dependence: Secondary | ICD-10-CM

## 2018-08-22 DIAGNOSIS — Z681 Body mass index (BMI) 19 or less, adult: Secondary | ICD-10-CM

## 2018-08-22 DIAGNOSIS — G9340 Encephalopathy, unspecified: Secondary | ICD-10-CM | POA: Diagnosis not present

## 2018-08-22 DIAGNOSIS — E43 Unspecified severe protein-calorie malnutrition: Secondary | ICD-10-CM | POA: Diagnosis present

## 2018-08-22 DIAGNOSIS — Z7982 Long term (current) use of aspirin: Secondary | ICD-10-CM

## 2018-08-22 DIAGNOSIS — Z96641 Presence of right artificial hip joint: Secondary | ICD-10-CM | POA: Diagnosis present

## 2018-08-22 DIAGNOSIS — K219 Gastro-esophageal reflux disease without esophagitis: Secondary | ICD-10-CM | POA: Diagnosis present

## 2018-08-22 DIAGNOSIS — R4701 Aphasia: Secondary | ICD-10-CM | POA: Diagnosis present

## 2018-08-22 DIAGNOSIS — R4182 Altered mental status, unspecified: Secondary | ICD-10-CM

## 2018-08-22 DIAGNOSIS — G9341 Metabolic encephalopathy: Secondary | ICD-10-CM | POA: Diagnosis present

## 2018-08-22 DIAGNOSIS — Z880 Allergy status to penicillin: Secondary | ICD-10-CM

## 2018-08-22 DIAGNOSIS — Z79899 Other long term (current) drug therapy: Secondary | ICD-10-CM | POA: Diagnosis not present

## 2018-08-22 DIAGNOSIS — N390 Urinary tract infection, site not specified: Secondary | ICD-10-CM | POA: Diagnosis present

## 2018-08-22 DIAGNOSIS — I252 Old myocardial infarction: Secondary | ICD-10-CM

## 2018-08-22 DIAGNOSIS — B9689 Other specified bacterial agents as the cause of diseases classified elsewhere: Secondary | ICD-10-CM | POA: Diagnosis present

## 2018-08-22 DIAGNOSIS — F419 Anxiety disorder, unspecified: Secondary | ICD-10-CM | POA: Diagnosis present

## 2018-08-22 DIAGNOSIS — R0902 Hypoxemia: Secondary | ICD-10-CM | POA: Diagnosis present

## 2018-08-22 DIAGNOSIS — F329 Major depressive disorder, single episode, unspecified: Secondary | ICD-10-CM | POA: Diagnosis present

## 2018-08-22 LAB — CBC WITH DIFFERENTIAL/PLATELET
ABS IMMATURE GRANULOCYTES: 0.03 10*3/uL (ref 0.00–0.07)
BASOS PCT: 0 %
Basophils Absolute: 0 10*3/uL (ref 0.0–0.1)
EOS ABS: 0 10*3/uL (ref 0.0–0.5)
Eosinophils Relative: 0 %
HEMATOCRIT: 37.9 % (ref 36.0–46.0)
Hemoglobin: 11.5 g/dL — ABNORMAL LOW (ref 12.0–15.0)
IMMATURE GRANULOCYTES: 0 %
LYMPHS ABS: 0.4 10*3/uL — AB (ref 0.7–4.0)
Lymphocytes Relative: 5 %
MCH: 29 pg (ref 26.0–34.0)
MCHC: 30.3 g/dL (ref 30.0–36.0)
MCV: 95.5 fL (ref 80.0–100.0)
MONOS PCT: 5 %
Monocytes Absolute: 0.4 10*3/uL (ref 0.1–1.0)
Neutro Abs: 7.2 10*3/uL (ref 1.7–7.7)
Neutrophils Relative %: 90 %
PLATELETS: 205 10*3/uL (ref 150–400)
RBC: 3.97 MIL/uL (ref 3.87–5.11)
RDW: 14.1 % (ref 11.5–15.5)
WBC: 8 10*3/uL (ref 4.0–10.5)
nRBC: 0 % (ref 0.0–0.2)

## 2018-08-22 LAB — URINALYSIS, ROUTINE W REFLEX MICROSCOPIC
Bilirubin Urine: NEGATIVE
Glucose, UA: NEGATIVE mg/dL
Ketones, ur: 15 mg/dL — AB
Nitrite: NEGATIVE
PROTEIN: 30 mg/dL — AB
SPECIFIC GRAVITY, URINE: 1.025 (ref 1.005–1.030)
pH: 6.5 (ref 5.0–8.0)

## 2018-08-22 LAB — COMPREHENSIVE METABOLIC PANEL
ALBUMIN: 3.2 g/dL — AB (ref 3.5–5.0)
ALK PHOS: 90 U/L (ref 38–126)
ALT: 12 U/L (ref 0–44)
AST: 21 U/L (ref 15–41)
Anion gap: 13 (ref 5–15)
BILIRUBIN TOTAL: 0.7 mg/dL (ref 0.3–1.2)
BUN: 24 mg/dL — AB (ref 8–23)
CALCIUM: 9.6 mg/dL (ref 8.9–10.3)
CO2: 24 mmol/L (ref 22–32)
Chloride: 109 mmol/L (ref 98–111)
Creatinine, Ser: 0.89 mg/dL (ref 0.44–1.00)
GFR calc Af Amer: >60 mL/min (ref >60)
GFR calc non Af Amer: 58 mL/min — ABNORMAL LOW (ref >60)
GLUCOSE: 129 mg/dL — AB (ref 70–99)
Potassium: 4.7 mmol/L (ref 3.5–5.1)
SODIUM: 146 mmol/L — AB (ref 135–145)
TOTAL PROTEIN: 6.9 g/dL (ref 6.5–8.1)

## 2018-08-22 LAB — URINALYSIS, MICROSCOPIC (REFLEX)

## 2018-08-22 LAB — I-STAT TROPONIN, ED: Troponin i, poc: 0.06 ng/mL (ref 0.00–0.08)

## 2018-08-22 LAB — I-STAT CG4 LACTIC ACID, ED
LACTIC ACID, VENOUS: 1.57 mmol/L (ref 0.5–1.9)
Lactic Acid, Venous: 1.48 mmol/L (ref 0.5–1.9)

## 2018-08-22 LAB — AMMONIA: Ammonia: 43 umol/L — ABNORMAL HIGH (ref 9–35)

## 2018-08-22 MED ORDER — SODIUM CHLORIDE 0.9 % IV SOLN
1.0000 g | Freq: Once | INTRAVENOUS | Status: AC
  Administered 2018-08-22: 1 g via INTRAVENOUS
  Filled 2018-08-22: qty 10

## 2018-08-22 MED ORDER — SODIUM CHLORIDE 0.9 % IV BOLUS
500.0000 mL | Freq: Once | INTRAVENOUS | Status: AC
  Administered 2018-08-22: 500 mL via INTRAVENOUS

## 2018-08-22 MED ORDER — ACETAMINOPHEN 325 MG PO TABS: 650.0000 mg | ORAL_TABLET | Freq: Four times a day (QID) | ORAL | Status: DC | PRN

## 2018-08-22 MED ORDER — ACETAMINOPHEN 650 MG RE SUPP: 650.0000 mg | Freq: Four times a day (QID) | RECTAL | Status: DC | PRN

## 2018-08-22 MED ORDER — ONDANSETRON HCL 4 MG PO TABS: 4.0000 mg | ORAL_TABLET | Freq: Four times a day (QID) | ORAL | Status: DC | PRN

## 2018-08-22 MED ORDER — TRAZODONE HCL 50 MG PO TABS
25.0000 mg | ORAL_TABLET | Freq: Every evening | ORAL | Status: DC | PRN
  Administered 2018-08-22 – 2018-08-23 (×2): 25 mg via ORAL
  Filled 2018-08-22 (×2): qty 1

## 2018-08-22 MED ORDER — SODIUM CHLORIDE 0.9 % IV SOLN
1.0000 g | INTRAVENOUS | Status: DC
  Administered 2018-08-23 – 2018-08-25 (×3): 1 g via INTRAVENOUS
  Filled 2018-08-22 (×3): qty 10

## 2018-08-22 MED ORDER — SODIUM CHLORIDE 0.9 % IV SOLN
INTRAVENOUS | Status: DC
  Administered 2018-08-22 – 2018-08-25 (×4): via INTRAVENOUS

## 2018-08-22 MED ORDER — ONDANSETRON HCL 4 MG/2ML IJ SOLN: 4.0000 mg | Freq: Four times a day (QID) | INTRAMUSCULAR | Status: DC | PRN

## 2018-08-22 MED ORDER — CLONAZEPAM 0.25 MG PO TBDP
0.2500 mg | ORAL_TABLET | Freq: Two times a day (BID) | ORAL | Status: DC | PRN
  Administered 2018-08-23: 0.25 mg via ORAL
  Filled 2018-08-22: qty 1

## 2018-08-22 MED ORDER — ENOXAPARIN SODIUM 30 MG/0.3ML ~~LOC~~ SOLN
30.0000 mg | SUBCUTANEOUS | Status: DC
  Administered 2018-08-22 – 2018-08-24 (×3): 30 mg via SUBCUTANEOUS
  Filled 2018-08-22 (×3): qty 0.3

## 2018-08-22 NOTE — ED Provider Notes (Addendum)
[name] Provider Note   CSN: [id number] Arrival date & time: 08/22/18  1458     History   Chief Complaint Chief Complaint  Patient presents with  . Altered Mental Status    HPI Jill [name] is a 84 y.o. female.  Patient is a 84 year old female who has a history of dementia, prior strokes, GERD who presents with increased confusion.  History is limited due to her dementia.  Per EMS she lives at home with her daughter who states that she has had some decreased alertness over the last week and today had some drooling while she was eating which is atypical for the patient.  She was also noted to be hypoxic on room air with a room air sat of 80% by EMS.  She was placed on 2 L of oxygen by nasal cannula.     Past Medical History:  Diagnosis Date  . Anemia   . Anxiety   . Depression   . GERD (gastroesophageal reflux disease)   . History of blood transfusion 12/16/2016  . Myocardial infarction [name]) ?2013   "silent"  . Stroke [facility]) 07/2014   "vascular dementia from undetected strokes/MD report" (12/16/2016  . Vascular dementia     Patient Active Problem List   Diagnosis Date Noted  . Acute encephalopathy 08/22/2018  . GERD without esophagitis 12/21/2016  . Slow transit constipation 12/21/2016  . Protein-calorie malnutrition (HCC) 12/21/2016  . Hip fracture (HCC) 12/14/2016  . Peri-prosthetic femoral shaft fracture 12/14/2016  . UTI (urinary tract infection) 02/28/2015  . Protein-calorie malnutrition, severe (HCC) 02/27/2015  . Fall   . Fracture of femoral neck, right (HCC) 02/25/2015  . Diastolic dysfunction 02/25/2015  . Hyperlipidemia 02/25/2015  . Dementia, multi-infarct (HCC)   . TIA (transient ischemic attack) 07/27/2014    Past Surgical History:  Procedure Laterality Date  . CATARACT EXTRACTION W/ INTRAOCULAR LENS IMPLANT     "? side"  . FRACTURE SURGERY    . HIP ARTHROPLASTY Right 02/25/2015   Procedure:  ARTHROPLASTY BIPOLAR HIP (HEMIARTHROPLASTY);  Surgeon: Eldred Manges, MD;  Location: [facility] OR;  Service: Orthopedics;  Laterality: Right;  . TOTAL HIP REVISION Right 12/14/2016   Procedure: RIGHT HIP HEMIARTHROPLASTY REVISION WITH CABLE PLACEMENT;  Surgeon: Eldred Manges, MD;  Location: [facility] OR;  Service: Orthopedics;  Laterality: Right;  Marland Kitchen VAGINAL HYSTERECTOMY       OB History   No obstetric history on file.      Home Medications    Prior to Admission medications   Medication Sig Start Date End Date Taking? Authorizing Provider  acetaminophen (TYLENOL) 325 MG tablet Take 2 tablets (650 mg total) by mouth every 6 (six) hours as needed for mild pain (or Fever >/= 101). 12/17/16   Osvaldo Shipper, MD  Amino Acids-Protein Hydrolys (FEEDING SUPPLEMENT, PRO-STAT SUGAR FREE 64,) LIQD Take 30 mLs by mouth 2 (two) times daily.    [provider]  aspirin EC 325 MG EC tablet Take 1 tablet (325 mg total) by mouth daily with breakfast. 12/17/16   Osvaldo Shipper, MD  clonazePAM (KLONOPIN) 0.25 MG disintegrating tablet Take 1 tablet (0.25 mg total) by mouth every 12 (twelve) hours. X 5 days 12/18/16   Renato Gails, Tiffany L, DO  docusate sodium (COLACE) 100 MG capsule Take 1 capsule (100 mg total) by mouth 2 (two) times daily. 12/17/16   Osvaldo Shipper, MD  Nutritional Supplements (NUTRITIONAL SUPPLEMENT PO) Take by mouth. House 2.0 - give orally two times daily  between meals    [provider]  polyethylene glycol (MIRALAX / GLYCOLAX) packet Take 17 g by mouth at bedtime.    [provider]  ranitidine (ZANTAC) 150 MG tablet Take 150 mg by mouth 2 (two) times daily.    [provider]  sodium phosphate (FLEET) 7-19 GM/118ML ENEM Place 133 mLs (1 enema total) rectally daily as needed for severe constipation. 12/17/16   Osvaldo Shipper, MD  traMADol (ULTRAM) 50 MG tablet Take by mouth every 8 (eight) hours as needed.    [provider]  traZODone (DESYREL) 50 MG tablet Take 1  tablet (50 mg total) by mouth at bedtime as needed for sleep. 12/17/16   Osvaldo Shipper, MD  UNABLE TO FIND HSG Regular Diet - HSG Mech Soft Texture    [provider]    Family History Family History  Family history unknown: Yes    Social History Social History   Tobacco Use  . Smoking status: Former Games developer  . Smokeless tobacco: Never Used  . Tobacco comment: "social smoker; quit in the 1970s"  Substance Use Topics  . Alcohol use: No  . Drug use: No     Allergies   Penicillins   Review of Systems Review of Systems  Unable to perform ROS: Dementia     Physical Exam Updated Vital Signs BP (!) 149/97 (BP Location: Right Arm)   Pulse (!) 105   Temp 97.9 F (36.6 C) (Oral)   Resp 17   Ht 5\' 1"  (1.549 m)   Wt 36.1 kg   SpO2 99%   BMI 15.04 kg/m   Physical Exam Constitutional:      Appearance: Normal appearance. She is well-developed.     Comments: Cachectic  HENT:     Head: Normocephalic and atraumatic.  Eyes:     Pupils: Pupils are equal, round, and reactive to light.  Neck:     Musculoskeletal: Normal range of motion and neck supple.  Cardiovascular:     Rate and Rhythm: Normal rate and regular rhythm.     Heart sounds: Normal heart sounds.  Pulmonary:     Effort: Pulmonary effort is normal. No respiratory distress.     Breath sounds: Normal breath sounds. No wheezing or rales.  Chest:     Chest wall: No tenderness.  Abdominal:     General: Bowel sounds are normal.     Palpations: Abdomen is soft.     Tenderness: There is no abdominal tenderness. There is no guarding or rebound.  Musculoskeletal: Normal range of motion.  Lymphadenopathy:     Cervical: No cervical adenopathy.  Skin:    General: Skin is warm and dry.     Findings: No rash.  Neurological:     Comments: Patient will open her eyes to verbal stimuli.  She is not following commands.  She seems to be moving all 4 extremities without an obvious deficit.  Reportedly she is a  phasic at baseline      ED Treatments / Results  Labs (all labs ordered are listed, but only abnormal results are displayed) Labs Reviewed  COMPREHENSIVE METABOLIC PANEL - Abnormal; Notable for the following components:      Result Value   Sodium 146 (*)    Glucose, Bld 129 (*)    BUN 24 (*)    Albumin 3.2 (*)    GFR calc non Af Amer 58 (*)    All other components within normal limits  CBC WITH DIFFERENTIAL/PLATELET - Abnormal;  Notable for the following components:   Hemoglobin 11.5 (*)    Lymphs Abs 0.4 (*)    All other components within normal limits  URINALYSIS, ROUTINE W REFLEX MICROSCOPIC - Abnormal; Notable for the following components:   APPearance CLOUDY (*)    Hgb urine dipstick SMALL (*)    Ketones, ur 15 (*)    Protein, ur 30 (*)    Leukocytes, UA MODERATE (*)    All other components within normal limits  URINALYSIS, MICROSCOPIC (REFLEX) - Abnormal; Notable for the following components:   Bacteria, UA MANY (*)    All other components within normal limits  AMMONIA - Abnormal; Notable for the following components:   Ammonia 43 (*)    All other components within normal limits  URINE CULTURE  CBC  BASIC METABOLIC PANEL  I-STAT CG4 LACTIC ACID, ED  I-STAT TROPONIN, ED  I-STAT CG4 LACTIC ACID, ED    EKG EKG Interpretation  Date/Time:  Monday August 22 2018 15:16:21 EST Ventricular Rate:  64 PR Interval:    QRS Duration: 94 QT Interval:  479 QTC Calculation: 495 R Axis:   -12 Text Interpretation:  Sinus rhythm Borderline low voltage, extremity leads Nonspecific T abnormalities, inferior leads Borderline prolonged QT interval T wave inversion appears new from prior EKG Confirmed by Rolan BuccoBelfi, Taytum Wheller [phone]([id number]) on 08/22/2018 3:32:06 PM   Radiology Dg Chest 2 View  Result Date: 08/22/2018 CLINICAL DATA:  Hypoxia EXAM: CHEST - 2 VIEW COMPARISON:  None. FINDINGS: The lungs are hyperinflated with diffuse interstitial prominence. No focal airspace consolidation or  pulmonary edema. No pleural effusion or pneumothorax. Normal cardiomediastinal contours. IMPRESSION: Hyperinflation without focal airspace disease. Electronically Signed   By: Deatra RobinsonKevin  Herman M.D.   On: 08/22/2018 16:22   Ct Head Wo Contrast  Result Date: 08/22/2018 CLINICAL DATA:  Altered level of consciousness EXAM: CT HEAD WITHOUT CONTRAST TECHNIQUE: Contiguous axial images were obtained from the base of the skull through the vertex without intravenous contrast. COMPARISON:  12/13/2016 FINDINGS: Brain: No evidence of acute infarction, hemorrhage, hydrocephalus, extra-axial collection or mass lesion/mass effect. Subcortical white matter and periventricular small vessel ischemic changes. Global cortical and central atrophy.  Mild ventricular prominence. Vascular: Intracranial atherosclerosis. Skull: Normal. Negative for fracture or focal lesion. Sinuses/Orbits: Mild mucosal thickening of the bilateral ethmoid and maxillary sinuses. Mastoid air cells are clear. Other: None. IMPRESSION: Atrophy with small vessel ischemic changes. Electronically Signed   By: Charline BillsSriyesh  Krishnan M.D.   On: 08/22/2018 18:34    Procedures Procedures (including critical care time)  Medications Ordered in ED Medications  0.9 %  sodium chloride infusion ( Intravenous New Bag/Given 08/22/18 1941)  cefTRIAXone (ROCEPHIN) 1 g in sodium chloride 0.9 % 100 mL IVPB (has no administration in time range)  acetaminophen (TYLENOL) tablet 650 mg (has no administration in time range)    Or  acetaminophen (TYLENOL) suppository 650 mg (has no administration in time range)  ondansetron (ZOFRAN) tablet 4 mg (has no administration in time range)    Or  ondansetron (ZOFRAN) injection 4 mg (has no administration in time range)  enoxaparin (LOVENOX) injection 30 mg (30 mg Subcutaneous Given 08/22/18 2225)  traZODone (DESYREL) tablet 25 mg (25 mg Oral Given 08/22/18 2221)  clonazePAM (KLONOPIN) disintegrating tablet 0.25 mg (has no  administration in time range)  cefTRIAXone (ROCEPHIN) 1 g in sodium chloride 0.9 % 100 mL IVPB (0 g Intravenous Stopped 08/22/18 1810)  sodium chloride 0.9 % bolus 500 mL (500 mLs Intravenous New Bag/Given 08/22/18 1855)  Initial Impression / Assessment and Plan / ED Course  I have reviewed the triage vital signs and the nursing notes.  Pertinent labs & imaging results that were available during my care of the patient were reviewed by me and considered in my medical decision making (see chart for details).     15:49 patient's daughter has arrived.  Patient lives at home with her daughter.  Her current stay is a dramatic decline from her baseline.  Daughter has video of her yesterday sitting at the table eating.  She normally is alert she does not have verbal communication but she does ambulate with assistance and was actually riding a golf cart when Christmas caroling a few days ago.  The daughter states since yesterday she has been a lot less active and has not been able get out of bed or stand today.  Her urine shows evidence of UTI.  It was sent for culture.  This is likely etiology of her decline.  There is no suggestions of sepsis.  Her chest x-ray is clear without evidence of pneumonia.  Her head CT does not show any acute abnormality.  She does have an oxygen requirement but I feel this is likely from hypoventilation due to her sleepiness.  She will wake up and answer questions but then goes back to sleep.  She does not have a leg swelling or other symptoms that would more consistent with PE.  She was given IV antibiotics.  She was given some IV fluids.  I spoke with Dr. Julian ReilGardner who will admit the patient for further treatment.  Final Clinical Impressions(s) / ED Diagnoses   Final diagnoses:  Altered mental status, unspecified altered mental status type  Urinary tract infection without hematuria, site unspecified    ED Discharge Orders    None       Rolan BuccoBelfi, Celeste Tavenner, MD 08/22/18  Serena [id number]    Rolan BuccoBelfi, Texanna Hilburn, MD 08/23/18 [phone]

## 2018-08-22 NOTE — ED Notes (Signed)
Patient transported to CT 

## 2018-08-22 NOTE — ED Triage Notes (Signed)
Per EMS pt is coming from her own home where she resides with her daughter. Pt is baseline aphasic and unable to ambulate from a previous CVA. Pt's daughter called EMS due to a decline in patient's alertness over the past week and patient was drooling while eating today which is unusual for the patient. EMS states patient's SpO2 was in the 80's on room air and they placed the patient on 2L via [place]. CBG 175

## 2018-08-22 NOTE — H&P (Signed)
History and Physical    [place] Virgo ZOX:[id number] DOB: [birth year] DOA: 08/22/2018  PCP: Marcell Angerlark, Candice P, NP  Patient coming from: Home  I have personally briefly reviewed patient's [facility]  Chief Complaint: AMS  HPI: Jill Hazard[place]ia [name] is a 84 y.o. female with medical history significant of Vascular dementia, aphasic at baseline, ambulates with assistance, eats without assistance.  Patient has had 2 day history of AMS.  Somewhat less responsive than usual yesterday.  Today she required significantly more assistance than usual getting out of bed.  Had episode of unresponsiveness at table today.  Decreased PO intake.  And in general has been less interactive than baseline.   ED Course: UA suggestive of UTI.  Also O2 sat in the 80s on presentation, improved with [place].  CXR neg   Review of Systems: As per HPI otherwise 10 point review of systems negative.   Past Medical History:  Diagnosis Date  . Anemia   . Anxiety   . Depression   . GERD (gastroesophageal reflux disease)   . History of blood transfusion 12/16/2016  . Myocardial infarction [facility](HCC) ?2013   "silent"  . Stroke [facility](HCC) 07/2014   "vascular dementia from undetected strokes/MD report" (12/16/2016  . Vascular dementia     Past Surgical History:  Procedure Laterality Date  . CATARACT EXTRACTION W/ INTRAOCULAR LENS IMPLANT     "? side"  . FRACTURE SURGERY    . HIP ARTHROPLASTY Right 02/25/2015   Procedure: ARTHROPLASTY BIPOLAR HIP (HEMIARTHROPLASTY);  Surgeon: Eldred MangesMark C Yates, MD;  [facility];  Service: Orthopedics;  Laterality: Right;  . TOTAL HIP REVISION Right 12/14/2016   Procedure: RIGHT HIP HEMIARTHROPLASTY REVISION WITH CABLE PLACEMENT;  Surgeon: Eldred MangesMark C Yates, MD;  Location: [facility] OR;  Service: Orthopedics;  Laterality: Right;  Marland Kitchen. VAGINAL HYSTERECTOMY       reports that she has quit smoking. She has never used smokeless tobacco. She reports that she does not drink alcohol or use  drugs.  Allergies  Allergen Reactions  . Penicillins Rash    Family History  Family history unknown: Yes     Prior to Admission medications   Medication Sig Start Date End Date Taking? Authorizing Provider  acetaminophen (TYLENOL) 325 MG tablet Take 2 tablets (650 mg total) by mouth every 6 (six) hours as needed for mild pain (or Fever >/= 101). 12/17/16   Osvaldo ShipperKrishnan, Gokul, MD  Amino Acids-Protein Hydrolys (FEEDING SUPPLEMENT, PRO-STAT SUGAR FREE 64,) LIQD Take 30 mLs by mouth 2 (two) times daily.    [provider]  aspirin EC 325 MG EC tablet Take 1 tablet (325 mg total) by mouth daily with breakfast. 12/17/16   Osvaldo ShipperKrishnan, Gokul, MD  clonazePAM (KLONOPIN) 0.25 MG disintegrating tablet Take 1 tablet (0.25 mg total) by mouth every 12 (twelve) hours. X 5 days 12/18/16   Renato Gailseed, Tiffany L, DO  docusate sodium (COLACE) 100 MG capsule Take 1 capsule (100 mg total) by mouth 2 (two) times daily. 12/17/16   Osvaldo ShipperKrishnan, Gokul, MD  Nutritional Supplements (NUTRITIONAL SUPPLEMENT PO) Take by mouth. House 2.0 - give orally two times daily between meals    [provider]  polyethylene glycol (MIRALAX / GLYCOLAX) packet Take 17 g by mouth at bedtime.    [provider]  ranitidine (ZANTAC) 150 MG tablet Take 150 mg by mouth 2 (two) times daily.    [provider]  sodium phosphate (FLEET) 7-19 GM/118ML ENEM Place 133 mLs (1 enema total) rectally daily  as needed for severe constipation. 12/17/16   Osvaldo Shipper, MD  traMADol (ULTRAM) 50 MG tablet Take by mouth every 8 (eight) hours as needed.    [provider]  traZODone (DESYREL) 50 MG tablet Take 1 tablet (50 mg total) by mouth at bedtime as needed for sleep. 12/17/16   Osvaldo Shipper, MD  UNABLE TO FIND HSG Regular Diet - HSG Mech Soft Texture    [provider]    Physical Exam: Vitals:   08/22/18 1800 08/22/18 1830 08/22/18 1845 08/22/18 1900  BP: (!) 142/82 (!) 146/78 (!) 146/68 (!) 142/78    Pulse: 67 61 (!) 58 (!) 56  Resp: 18 14 13 13   Temp:      TempSrc:      SpO2: 99% 98% 98% 100%    Constitutional: NAD, calm, comfortable, cachectic Eyes: PERRL, lids and conjunctivae normal ENMT: Mucous membranes are moist. Posterior pharynx clear of any exudate or lesions.Normal dentition.  Neck: normal, supple, no masses, no thyromegaly Respiratory: clear to auscultation bilaterally, no wheezing, no crackles. Normal respiratory effort. No accessory muscle use.  Cardiovascular: Regular rate and rhythm, no murmurs / rubs / gallops. No extremity edema. 2+ pedal pulses. No carotid bruits.  Abdomen: no tenderness, no masses palpated. No hepatosplenomegaly. Bowel sounds positive.  Musculoskeletal: no clubbing / cyanosis. No joint deformity upper and lower extremities. Good ROM, no contractures. Normal muscle tone.  Skin: no rashes, lesions, ulcers. No induration Neurologic: eyes open to verbal stimuli, not following commands, MAE, aphasic at baseline. Psychiatric: Normal judgment and insight. Alert and oriented x 3. Normal mood.    Labs on Admission: I have personally reviewed following labs and imaging studies  CBC: Recent Labs  Lab 08/22/18 1655  WBC 8.0  NEUTROABS 7.2  HGB 11.5*  HCT 37.9  MCV 95.5  PLT 205   Basic Metabolic Panel: Recent Labs  Lab 08/22/18 1655  NA 146*  K 4.7  CL 109  CO2 24  GLUCOSE 129*  BUN 24*  CREATININE 0.89  CALCIUM 9.6   GFR: CrCl cannot be calculated (Unknown ideal weight.). Liver Function Tests: Recent Labs  Lab 08/22/18 1655  AST 21  ALT 12  ALKPHOS 90  BILITOT 0.7  PROT 6.9  ALBUMIN 3.2*   No results for input(s): LIPASE, AMYLASE in the last 168 hours. No results for input(s): AMMONIA in the last 168 hours. Coagulation Profile: No results for input(s): INR, PROTIME in the last 168 hours. Cardiac Enzymes: No results for input(s): CKTOTAL, CKMB, CKMBINDEX, TROPONINI in the last 168 hours. BNP (last 3 results) No results  for input(s): PROBNP in the last 8760 hours. HbA1C: No results for input(s): HGBA1C in the last 72 hours. CBG: No results for input(s): GLUCAP in the last 168 hours. Lipid Profile: No results for input(s): CHOL, HDL, LDLCALC, TRIG, CHOLHDL, LDLDIRECT in the last 72 hours. Thyroid Function Tests: No results for input(s): TSH, T4TOTAL, FREET4, T3FREE, THYROIDAB in the last 72 hours. Anemia Panel: No results for input(s): VITAMINB12, FOLATE, FERRITIN, TIBC, IRON, RETICCTPCT in the last 72 hours. Urine analysis:    Component Value Date/Time   COLORURINE YELLOW 08/22/2018 1643   APPEARANCEUR CLOUDY (A) 08/22/2018 1643   LABSPEC 1.025 08/22/2018 1643   PHURINE 6.5 08/22/2018 1643   GLUCOSEU NEGATIVE 08/22/2018 1643   HGBUR SMALL (A) 08/22/2018 1643   BILIRUBINUR NEGATIVE 08/22/2018 1643   KETONESUR 15 (A) 08/22/2018 1643   PROTEINUR 30 (A) 08/22/2018 1643   NITRITE NEGATIVE 08/22/2018 1643  LEUKOCYTESUR MODERATE (A) 08/22/2018 1643    Radiological Exams on Admission: Dg Chest 2 View  Result Date: 08/22/2018 CLINICAL DATA:  Hypoxia EXAM: CHEST - 2 VIEW COMPARISON:  None. FINDINGS: The lungs are hyperinflated with diffuse interstitial prominence. No focal airspace consolidation or pulmonary edema. No pleural effusion or pneumothorax. Normal cardiomediastinal contours. IMPRESSION: Hyperinflation without focal airspace disease. Electronically Signed   By: Deatra RobinsonKevin  Herman M.D.   On: 08/22/2018 16:22   Ct Head Wo Contrast  Result Date: 08/22/2018 CLINICAL DATA:  Altered level of consciousness EXAM: CT HEAD WITHOUT CONTRAST TECHNIQUE: Contiguous axial images were obtained from the base of the skull through the vertex without intravenous contrast. COMPARISON:  12/13/2016 FINDINGS: Brain: No evidence of acute infarction, hemorrhage, hydrocephalus, extra-axial collection or mass lesion/mass effect. Subcortical white matter and periventricular small vessel ischemic changes. Global cortical and  central atrophy.  Mild ventricular prominence. Vascular: Intracranial atherosclerosis. Skull: Normal. Negative for fracture or focal lesion. Sinuses/Orbits: Mild mucosal thickening of the bilateral ethmoid and maxillary sinuses. Mastoid air cells are clear. Other: None. IMPRESSION: Atrophy with small vessel ischemic changes. Electronically Signed   By: Charline BillsSriyesh  Krishnan M.D.   On: 08/22/2018 18:34    EKG: Independently reviewed.  Assessment/Plan Principal Problem:   Acute encephalopathy Active Problems:   Dementia, multi-infarct (HCC)    1. Acute encephalopathy on chronic advanced vascular dementia 1. Possibly due to the UTI 1. UCx pending 2. Rocephin 2. Also checking Ammonia 3. MRI brain 4. EEG 5. PT/OT/SLP 6. IVF: NS at 77 7. Repeat CBC/BMP in AM 8. Rarely takes trazodone and klonapin as only meds, as well as CBD oil  DVT prophylaxis: Lovenox Code Status: DNR - confirmed with daughter Family Communication: Daughter at bedside Disposition Plan: Home after admit Consults called: None Admission status: Admit to inpatient  Severity of Illness: The appropriate patient status for this patient is INPATIENT. Inpatient status is judged to be reasonable and necessary in order to provide the required intensity of service to ensure the patient's safety. The patient's presenting symptoms, physical exam findings, and initial radiographic and laboratory data in the context of their chronic comorbidities is felt to place them at high risk for further clinical deterioration. Furthermore, it is not anticipated that the patient will be medically stable for discharge from the hospital within 2 midnights of admission. The following factors support the patient status of inpatient.   " The patient's presenting symptoms include AMS, decreased PO intake. " The worrisome physical exam findings include lethargy, decreased responsiveness from baseline. " The initial radiographic and laboratory data are  worrisome because of UTI. " The chronic co-morbidities include Vascular dementia, aphasia.   * I certify that at the point of admission it is my clinical judgment that the patient will require inpatient hospital care spanning beyond 2 midnights from the point of admission due to high intensity of service, high risk for further deterioration and high frequency of surveillance required.Hillary Bow*    GARDNER, JARED M. DO [facility] Pager [phone] Only works nights!  If 7AM-7PM, please contact the primary day team physician taking care of patient  [email or web address] Password [email or web address]  08/22/2018, 7:38 PM

## 2018-08-22 NOTE — Progress Notes (Signed)
Arrived from Ed. Alert and nonverbal. Doesn't follow command. Dtr at bedside. Call light within reach.

## 2018-08-23 ENCOUNTER — Inpatient Hospital Stay (HOSPITAL_COMMUNITY): Payer: Medicare Other

## 2018-08-23 DIAGNOSIS — G9340 Encephalopathy, unspecified: Secondary | ICD-10-CM

## 2018-08-23 LAB — BASIC METABOLIC PANEL
ANION GAP: 12 (ref 5–15)
BUN: 22 mg/dL (ref 8–23)
CO2: 22 mmol/L (ref 22–32)
Calcium: 8.6 mg/dL — ABNORMAL LOW (ref 8.9–10.3)
Chloride: 109 mmol/L (ref 98–111)
Creatinine, Ser: 0.75 mg/dL (ref 0.44–1.00)
GFR calc non Af Amer: >60 mL/min (ref >60)
Glucose, Bld: 82 mg/dL (ref 70–99)
Potassium: 3.5 mmol/L (ref 3.5–5.1)
Sodium: 143 mmol/L (ref 135–145)

## 2018-08-23 LAB — CBC
HCT: 31.2 % — ABNORMAL LOW (ref 36.0–46.0)
Hemoglobin: 9.6 g/dL — ABNORMAL LOW (ref 12.0–15.0)
MCH: 28.8 pg (ref 26.0–34.0)
MCHC: 30.8 g/dL (ref 30.0–36.0)
MCV: 93.7 fL (ref 80.0–100.0)
Platelets: 200 10*3/uL (ref 150–400)
RBC: 3.33 MIL/uL — ABNORMAL LOW (ref 3.87–5.11)
RDW: 14.1 % (ref 11.5–15.5)
WBC: 4.7 10*3/uL (ref 4.0–10.5)
nRBC: 0 % (ref 0.0–0.2)

## 2018-08-23 MED ORDER — ASPIRIN 81 MG PO CHEW
81.0000 mg | CHEWABLE_TABLET | Freq: Every day | ORAL | Status: DC
  Administered 2018-08-23 – 2018-08-25 (×3): 81 mg via ORAL
  Filled 2018-08-23 (×3): qty 1

## 2018-08-23 NOTE — Evaluation (Signed)
Occupational Therapy Evaluation Patient Details Name: Jill [name] MRN: [id number] DOB: [birth year] Today's Date: 08/23/2018    History of Present Illness Jill [name] is a 84 y.o. female with medical history significant of Vascular dementia, aphasic at baseline, ambulates with assistance, eats without assistance.  pt presented to ED somewhat less responsive than usual, requiring more assistance than usual.  UA suggestive of UTI.   Clinical Impression   This 84 y/o female presents with the above. Pt not able to provide PLOF, though per chart review pt was living with daughter and receiving assist for mobility and ADLs. Pt overall not able to follow simple commands this session. She currently requires maxA for bed mobility and mod-maxA (+2 utilized this session for safety) for stand pivot transfer OOB to recliner. Pt currently requires max-totalA for UB and LB ADL given her current cognitive deficits. Will continue to follow while she remains in acute setting; currently recommend pt return home with 24hr assist/supervision and HH therapy services, however will need to confirm availability and level of family/caregiver assist at home. If family not able to provide necessary assist may be need to consider SNF. Will follow.     Follow Up Recommendations  Home health OT;Supervision/Assistance - 24 hour(if family able to provide 24hr, if not may need SNF)    Equipment Recommendations  Other (comment)(TBD)           Precautions / Restrictions Precautions Precautions: Fall      Mobility Bed Mobility Overal bed mobility: Needs Assistance Bed Mobility: Supine to Sit     Supine to sit: Max assist     General bed mobility comments: pt unable to follow verbal direction.  used tactile cues and guestures, but no initiation from the patient. assist for LEs and trunk elevation, use of bed pad to scoot hips towards EOB. pt initially with posterior lean requiring assist for sitting balance,  progressed to minguard  Transfers Overall transfer level: Needs assistance Equipment used: 1 person hand held assist;2 person hand held assist Transfers: Sit to/from UGI CorporationStand;Stand Pivot Transfers Sit to Stand: Max assist Stand pivot transfers: Mod assist;+2 physical assistance;+2 safety/equipment       General transfer comment: not able to follow direction.  physical assist needed to get pt moving. pt performed x2 sit<>stands from EOB during brief and gown change, NT present to assist. once standing and initiating turning towards recliner pt taking small pivotal steps to assist.     Balance Overall balance assessment: Needs assistance   Sitting balance-Leahy Scale: Fair       Standing balance-Leahy Scale: Poor Standing balance comment: reliant on external support                           ADL either performed or assessed with clinical judgement   ADL Overall ADL's : Needs assistance/impaired                                     Functional mobility during ADLs: Maximal assistance;+2 for safety/equipment(stand pivot transfer) General ADL Comments: pt requires overall max-totalA for ADLs at this time, cognitive impairments also impact pt's level of independence     Vision         Perception     Praxis      Pertinent Vitals/Pain Pain Assessment: Faces Faces Pain Scale: No hurt Pain Intervention(s): Monitored during session  Hand Dominance     Extremity/Trunk Assessment Upper Extremity Assessment Upper Extremity Assessment: Generalized weakness;Difficult to assess due to impaired cognition   Lower Extremity Assessment Lower Extremity Assessment: Defer to PT evaluation       Communication Communication Communication: Expressive difficulties;Receptive difficulties   Cognition Arousal/Alertness: Awake/alert Behavior During Therapy: WFL for tasks assessed/performed Overall Cognitive Status: History of cognitive impairments - at  baseline                                 General Comments: pt with baseline dementia, grossly follows one step commands <25% of the time   General Comments  no family/caregiver present at time of eval    Exercises     Shoulder Instructions      Home Living Family/patient expects to be discharged to:: Unsure                                 Additional Comments: lives with daughter in her own home.  Ambulated with assist.      Prior Functioning/Environment Level of Independence: Needs assistance  Gait / Transfers Assistance Needed: per chart pt ambulates with assist ADL's / Homemaking Assistance Needed: received assist for self-feeding            OT Problem List: Decreased strength;Decreased range of motion;Decreased activity tolerance;Impaired balance (sitting and/or standing);Decreased cognition      OT Treatment/Interventions: Self-care/ADL training;Therapeutic exercise;Neuromuscular education;DME and/or AE instruction;Cognitive remediation/compensation;Patient/family education;Balance training    OT Goals(Current goals can be found in the care plan section) Acute Rehab OT Goals Patient Stated Goal: pt unable OT Goal Formulation: Patient unable to participate in goal setting Time For Goal Achievement: 09/06/18 Potential to Achieve Goals: Fair  OT Frequency: Min 2X/week   Barriers to D/C:            Co-evaluation              AM-PAC OT "6 Clicks" Daily Activity     Outcome Measure Help from another person eating meals?: A Lot Help from another person taking care of personal grooming?: A Lot Help from another person toileting, which includes using toliet, bedpan, or urinal?: Total Help from another person bathing (including washing, rinsing, drying)?: Total Help from another person to put on and taking off regular upper body clothing?: Total Help from another person to put on and taking off regular lower body clothing?: Total 6  Click Score: 8   End of Session Nurse Communication: Mobility status  Activity Tolerance: Patient tolerated treatment well Patient left: in chair;with call bell/phone within reach;with chair alarm set;with nursing/sitter in room  OT Visit Diagnosis: Muscle weakness (generalized) (M62.81);Other abnormalities of gait and mobility (R26.89);Other symptoms and signs involving cognitive function                Time: 1401-1426 OT Time Calculation (min): 25 min Charges:  OT General Charges $OT Visit: 1 Visit OT Evaluation $OT Eval Moderate Complexity: 1 Mod OT Treatments $Self Care/Home Management : 8-22 mins  Marcy SirenBreanna Ludell Zacarias, OT Supplemental Rehabilitation Services Pager [phone] Office [phone]   Orlando PennerBreanna L Lorriann Hansmann 08/23/2018, 4:09 PM

## 2018-08-23 NOTE — Progress Notes (Addendum)
PROGRESS NOTE    Jill HazardVirginia Duchesneau  ZOX:[id number] DOB: 20-Dec-1928 DOA: 08/22/2018 PCP: Marcell Angerlark, Candice P, NP     Brief Narrative:  Jill [name] is a 84 yo female with past medical history significant for vascular dementia, is aphasic at baseline, ambulates with assistance, eats with assistance at baseline. She was brought into the hospital due to 2-day history of altered mental status.  She was reported to have some decreased responsiveness than her baseline.  Yesterday morning, she required significantly more assistance than usual getting out of bed, had episode of unresponsiveness at the table with decreased p.o. intake.  She has in general been less interactive than her baseline.  In the emergency department, CT head did not reveal any acute intracranial abnormalities, UA was suggestive of urinary tract infection and patient was started with IV antibiotics.  New events last 24 hours / Subjective: No new events overnight.  No family at bedside.  She underwent MRI brain which revealed punctate focus of restricted diffusion at the left frontoparietal vertex consistent with tiny acute infarction.  This was discussed with neurologist on-call, he reviewed imaging and feels that this was an incidental finding and not contributing to her altered mental status on admission.  She remains aphasic, does not follow commands.  Assessment & Plan:   Principal Problem:   Acute encephalopathy Active Problems:   Dementia, multi-infarct (HCC)  Acute metabolic encephalopathy with advanced vascular dementia -Possibly secondary to UTI -EEG pending  -Monitor   Advanced vascular dementia with previous strokes -MRI brain which revealed punctate focus of restricted diffusion at the left frontoparietal vertex consistent with tiny acute infarction -Discussed with neurologist on call Dr. Wilford CornerArora who reviewed imaging and feels this was an incidental finding and not contributing to her altered mental status. No further  work up recommended  -Aspirin 81mg  daily   Urinary tract infection, present on admission -Urine culture is pending, continue Rocephin  Anxiety -Continue klonopin   DVT prophylaxis: Lovenox Code Status: DNR Family Communication: No family at bedside, spoke w daughter over the phone Disposition Plan: Pending improvement, suspect return back home   Consultants:   None  Procedures:   None   Antimicrobials:  Anti-infectives (From admission, onward)   Start     Dose/Rate Route Frequency Ordered Stop   08/23/18 1800  cefTRIAXone (ROCEPHIN) 1 g in sodium chloride 0.9 % 100 mL IVPB     1 g 200 mL/hr over 30 Minutes Intravenous Every 24 hours 08/22/18 1917     08/22/18 1745  cefTRIAXone (ROCEPHIN) 1 g in sodium chloride 0.9 % 100 mL IVPB     1 g 200 mL/hr over 30 Minutes Intravenous  Once 08/22/18 1730 08/22/18 1810       Objective: Vitals:   08/22/18 2328 08/23/18 0348 08/23/18 0949 08/23/18 1242  BP: (!) 149/97 (!) 142/79 (!) 145/71 (!) 157/70  Pulse: (!) 105 100 60 70  Resp: 17 16 16 16   Temp: 97.9 F (36.6 C) 97.9 F (36.6 C) 97.8 F (36.6 C) 98 F (36.7 C)  TempSrc: Oral Oral Oral Oral  SpO2: 99% 97% 95% 94%  Weight:      Height:        Intake/Output Summary (Last 24 hours) at 08/23/2018 1252 Last data filed at 08/23/2018 0426 Gross per 24 hour  Intake 754.97 ml  Output -  Net 754.97 ml   Filed Weights   08/22/18 2027  Weight: 36.1 kg    Examination:  General exam: Appears calm and comfortable,  frail and elderly female  Respiratory system: Clear to auscultation. Respiratory effort normal. Cardiovascular system: S1 & S2 heard, RRR. No JVD, murmurs, rubs, gallops or clicks. No pedal edema. Gastrointestinal system: Abdomen is nondistended, soft Central nervous system: Arouses to voice, does not interact or answer questions  Extremities: Symmetric  Skin: No rashes, lesions or ulcers Psychiatry: Advanced dementia   Data Reviewed: I have personally  reviewed following labs and imaging studies  CBC: Recent Labs  Lab 08/22/18 1655 08/23/18 0525  WBC 8.0 4.7  NEUTROABS 7.2  --   HGB 11.5* 9.6*  HCT 37.9 31.2*  MCV 95.5 93.7  PLT 205 200   Basic Metabolic Panel: Recent Labs  Lab 08/22/18 1655 08/23/18 0525  NA 146* 143  K 4.7 3.5  CL 109 109  CO2 24 22  GLUCOSE 129* 82  BUN 24* 22  CREATININE 0.89 0.75  CALCIUM 9.6 8.6*   GFR: Estimated Creatinine Clearance: 28.2 mL/min (by C-G formula based on SCr of 0.75 mg/dL). Liver Function Tests: Recent Labs  Lab 08/22/18 1655  AST 21  ALT 12  ALKPHOS 90  BILITOT 0.7  PROT 6.9  ALBUMIN 3.2*   No results for input(s): LIPASE, AMYLASE in the last 168 hours. Recent Labs  Lab 08/22/18 2035  AMMONIA 43*   Coagulation Profile: No results for input(s): INR, PROTIME in the last 168 hours. Cardiac Enzymes: No results for input(s): CKTOTAL, CKMB, CKMBINDEX, TROPONINI in the last 168 hours. BNP (last 3 results) No results for input(s): PROBNP in the last 8760 hours. HbA1C: No results for input(s): HGBA1C in the last 72 hours. CBG: No results for input(s): GLUCAP in the last 168 hours. Lipid Profile: No results for input(s): CHOL, HDL, LDLCALC, TRIG, CHOLHDL, LDLDIRECT in the last 72 hours. Thyroid Function Tests: No results for input(s): TSH, T4TOTAL, FREET4, T3FREE, THYROIDAB in the last 72 hours. Anemia Panel: No results for input(s): VITAMINB12, FOLATE, FERRITIN, TIBC, IRON, RETICCTPCT in the last 72 hours. Sepsis Labs: Recent Labs  Lab 08/22/18 1704 08/22/18 1711  LATICACIDVEN 1.48 1.57    No results found for this or any previous visit (from the past 240 hour(s)).     Radiology Studies: Dg Chest 2 View  Result Date: 08/22/2018 CLINICAL DATA:  Hypoxia EXAM: CHEST - 2 VIEW COMPARISON:  None. FINDINGS: The lungs are hyperinflated with diffuse interstitial prominence. No focal airspace consolidation or pulmonary edema. No pleural effusion or pneumothorax.  Normal cardiomediastinal contours. IMPRESSION: Hyperinflation without focal airspace disease. Electronically Signed   By: Deatra RobinsonKevin  Herman M.D.   On: 08/22/2018 16:22   Ct Head Wo Contrast  Result Date: 08/22/2018 CLINICAL DATA:  Altered level of consciousness EXAM: CT HEAD WITHOUT CONTRAST TECHNIQUE: Contiguous axial images were obtained from the base of the skull through the vertex without intravenous contrast. COMPARISON:  12/13/2016 FINDINGS: Brain: No evidence of acute infarction, hemorrhage, hydrocephalus, extra-axial collection or mass lesion/mass effect. Subcortical white matter and periventricular small vessel ischemic changes. Global cortical and central atrophy.  Mild ventricular prominence. Vascular: Intracranial atherosclerosis. Skull: Normal. Negative for fracture or focal lesion. Sinuses/Orbits: Mild mucosal thickening of the bilateral ethmoid and maxillary sinuses. Mastoid air cells are clear. Other: None. IMPRESSION: Atrophy with small vessel ischemic changes. Electronically Signed   By: Charline BillsSriyesh  Krishnan M.D.   On: 08/22/2018 18:34   Mr Brain Wo Contrast  Result Date: 08/23/2018 CLINICAL DATA:  Vascular dementia. Two day history of worsening mental status. EXAM: MRI HEAD WITHOUT CONTRAST TECHNIQUE: Multiplanar, multiecho pulse sequences of  the brain and surrounding structures were obtained without intravenous contrast. COMPARISON:  Head CT 08/22/2018.  MRI 07/28/2014. FINDINGS: Brain: Brain shows advanced generalized atrophy. There are chronic microvascular changes throughout the white matter, basal ganglia and thalami. Diffusion imaging suggests a punctate acute infarction at the left frontoparietal vertex. This may well be incidental. No evidence of mass lesion, hemorrhage, hydrocephalus or extra-axial collection. Vascular: Major vessels at the base of the brain show flow. Skull and upper cervical spine: Negative Sinuses/Orbits: Clear/normal Other: None IMPRESSION: Advanced generalized  atrophy. Chronic microvascular changes throughout. Punctate focus of restricted diffusion at the left frontoparietal vertex consistent with a tiny acute infarction. This could well be subclinical. It seems to be in the region of the post central gyrus. Electronically Signed   By: Paulina Fusi M.D.   On: 08/23/2018 09:48      Scheduled Meds: . enoxaparin (LOVENOX) injection  30 mg Subcutaneous Q24H   Continuous Infusions: . sodium chloride 75 mL/hr at 08/22/18 1941  . cefTRIAXone (ROCEPHIN)  IV       LOS: 1 day    Time spent: 45  minutes   Noralee Stain, DO [facility] [email or web address] Password [facility] 08/23/2018, 12:52 PM

## 2018-08-23 NOTE — Progress Notes (Signed)
EEG complete - results pending 

## 2018-08-23 NOTE — Progress Notes (Signed)
SLP Cancellation Note  Patient Details Name: Jill [name] MRN: [id number] DOB: 1928/12/29   Cancelled treatment:       Reason Eval/Treat Not Completed: Other (comment)(pt having an EEG - at this time, will continue efforts)   Chales AbrahamsKimball, Buzz Axel Ann 08/23/2018, 11:21 AM  Donavan Burnetamara Amonte Brookover, MS [facility] SLP Acute Rehab Services Pager [phone] Office [phone]

## 2018-08-23 NOTE — Progress Notes (Signed)
OT Cancellation Note  Patient Details Name: Jill HazardVirginia Gloor MRN: [id number] DOB: Jun 04, 1929   Cancelled Treatment:    Reason Eval/Treat Not Completed: Patient at procedure or test/ unavailable (EEG); will follow up for OT eval as schedule permits and as pt is available.  Marcy SirenBreanna Kortez Murtagh, OT Supplemental Rehabilitation Services Pager [phone] Office [phone]   Orlando PennerBreanna L Merlyn Conley 08/23/2018, 10:59 AM

## 2018-08-23 NOTE — Evaluation (Signed)
Clinical/Bedside Swallow Evaluation Patient Details  Name: Jill HazardVirginia Wissner MRN: [id number] Date of Birth: September 07, 1928  Today's Date: 08/23/2018 Time: SLP Start Time (ACUTE ONLY): 1218 SLP Stop Time (ACUTE ONLY): 1228 SLP Time Calculation (min) (ACUTE ONLY): 10 min  Past Medical History:  Past Medical History:  Diagnosis Date  . Anemia   . Anxiety   . Depression   . GERD (gastroesophageal reflux disease)   . History of blood transfusion 12/16/2016  . Myocardial infarction [facility](HCC) ?2013   "silent"  . Stroke [facility](HCC) 07/2014   "vascular dementia from undetected strokes/MD report" (12/16/2016  . Vascular dementia    Past Surgical History:  Past Surgical History:  Procedure Laterality Date  . CATARACT EXTRACTION W/ INTRAOCULAR LENS IMPLANT     "? side"  . FRACTURE SURGERY    . HIP ARTHROPLASTY Right 02/25/2015   Procedure: ARTHROPLASTY BIPOLAR HIP (HEMIARTHROPLASTY);  Surgeon: Eldred MangesMark C Yates, MD;  [facility];  Service: Orthopedics;  Laterality: Right;  . TOTAL HIP REVISION Right 12/14/2016   Procedure: RIGHT HIP HEMIARTHROPLASTY REVISION WITH CABLE PLACEMENT;  Surgeon: Eldred MangesMark C Yates, MD;  Location: [facility] OR;  Service: Orthopedics;  Laterality: Right;  Marland Kitchen. VAGINAL HYSTERECTOMY     HPI:  84 [facility], poor po intake x 2 days.  PMH + for dementia, TIA, fall, hip fx 11/2016, lives with daughter.     Assessment / Plan / Recommendation Clinical Impression  Limited evaluation completed due to pt lack of desire for intake.  She did not demonstrate indications of airway compromise with po intake provided *graham cracker, yogurt, thin juice via tsp, straw.  Able to hold her own cup to feed self liquids.  ? mild facial asymmetry on left?  Slow vertical mastication pattern with solids - not rotary.  Recommend dys3/thin diet due to vertical mastication pattern with solids.  Will follow up for education with daughter.   SLP Visit Diagnosis: Dysphagia, oral phase (R13.11)    Aspiration  Risk  Mild aspiration risk    Diet Recommendation Dysphagia 3 (Mech soft);Thin liquid   Liquid Administration via: Cup;Straw Medication Administration: Whole meds with puree Supervision: Patient able to self feed;Full supervision/cueing for compensatory strategies Compensations: Slow rate;Small sips/bites Postural Changes: Seated upright at 90 degrees;Remain upright for at least 30 minutes after po intake    Other  Recommendations Oral Care Recommendations: Oral care BID   Follow up Recommendations None      Frequency and Duration min 1 x/week  1 week       Prognosis Prognosis for Safe Diet Advancement: Fair Barriers to Reach Goals: Cognitive deficits      Swallow Study   General Date of Onset: 08/23/18 HPI: 84 [facility], poor po intake x 2 days.  PMH + for dementia, TIA, fall, hip fx 11/2016, lives with daughter.   Type of Study: Bedside Swallow Evaluation Diet Prior to this Study: NPO Temperature Spikes Noted: No Respiratory Status: Room air History of Recent Intubation: No Behavior/Cognition: Lethargic/Drowsy;Requires cueing Oral Cavity Assessment: Other (comment)(did not ) Oral Care Completed by SLP: No Oral Cavity - Dentition: Other (Comment) Vision: Functional for self-feeding Self-Feeding Abilities: Able to feed self Patient Positioning: Upright in bed Baseline Vocal Quality: Low vocal intensity;Other (comment)(no voicing) Volitional Cough: Cognitively unable to elicit Volitional Swallow: Unable to elicit    Oral/Motor/Sensory Function Overall Oral Motor/Sensory Function: Other (comment)(pt did not follow commands, able to seal lips on straw/cracker)   [employer] Chips  Ice chips: Not tested   Thin Liquid Thin Liquid: Within functional limits Presentation: Straw;Spoon;Self Fed Other Comments: held own cup    Nectar Thick Nectar Thick Liquid: Not tested   Honey Thick Honey Thick Liquid: Not tested   Puree Puree: Within functional  limits Presentation: Spoon   Solid     Solid: Within functional limits Presentation: Self Fed Other Comments: pt able to masticate and transit graham cracker, intake remains poor, suspect dementia contribution      Chales AbrahamsKimball, Roneisha Stern Ann 08/23/2018,12:59 PM  Donavan Burnetamara Erol Flanagin, MS [facility] SLP Acute Rehab Services Pager [phone] Office [phone]

## 2018-08-23 NOTE — Evaluation (Signed)
Physical Therapy Evaluation Patient Details Name: Jill [name] MRN: [id number] DOB: 22-Apr-1929 Today's Date: 08/23/2018   History of Present Illness  HPI: Jill [name] is a 84 y.o. female with medical history significant of Vascular dementia, aphasic at baseline, ambulates with assistance, eats without assistance.  pt presented to ED somewhat less responsive than usual, requiring more assistance than usual.  UA suggestive of UTI.  Clinical Impression  Pt admitted with/for AMS.  Pt doesn't take much assist (light moderate), but does not understand commands and can not get her wishes across to caregivers..  Pt currently limited functionally due to the problems listed below.  (see problems list.)  Pt will benefit from PT to maximize function and safety to be able to get home safely with available assist.     Follow Up Recommendations Home health PT;Supervision/Assistance - 24 hour(unless pt does not have 24 hr assist then SNF)    Equipment Recommendations  None recommended by PT    Recommendations for Other Services       Precautions / Restrictions Precautions Precautions: Fall      Mobility  Bed Mobility Overal bed mobility: Needs Assistance Bed Mobility: Supine to Sit;Sit to Supine     Supine to sit: Mod assist Sit to supine: Mod assist   General bed mobility comments: pt unable to follow verbal direction.  used tactile cues and guestures, but no initiation from the patient  Transfers Overall transfer level: Needs assistance   Transfers: Sit to/from BJ'sStand;Stand Pivot Transfers Sit to Stand: Mod assist Stand pivot transfers: Mod assist       General transfer comment: not able to follow direction.  physical assist needed to get pt moving.  Ambulation/Gait Ambulation/Gait assistance: Min assist Gait Distance (Feet): 80 Feet Assistive device: Rolling walker (2 wheeled) Gait Pattern/deviations: Step-through pattern   Gait velocity interpretation: <1.31 ft/sec,  indicative of household ambulator General Gait Details: pt automatically gripped the RW for ambulation.  No following of commands.  Generally steady with slow cadence.  Stairs            Wheelchair Mobility    Modified Rankin (Stroke Patients Only)       Balance Overall balance assessment: Needs assistance   Sitting balance-Leahy Scale: Fair       Standing balance-Leahy Scale: Poor Standing balance comment: reliant on RW                             Pertinent Vitals/Pain Pain Assessment: Faces Faces Pain Scale: No hurt Pain Intervention(s): Monitored during session    Home Living Family/patient expects to be discharged to:: Unsure                 Additional Comments: lives with daughter in her own home.  Ambulated with assist.    Prior Function Level of Independence: Needs assistance               Hand Dominance        Extremity/Trunk Assessment   Upper Extremity Assessment Upper Extremity Assessment: Difficult to assess due to impaired cognition(functional to hold RW)    Lower Extremity Assessment Lower Extremity Assessment: Difficult to assess due to impaired cognition(bear weight well to ambulate and transfer.)       Communication   Communication: Expressive difficulties;Receptive difficulties  Cognition Arousal/Alertness: Awake/alert Behavior During Therapy: WFL for tasks assessed/performed Overall Cognitive Status: Within Functional Limits for tasks assessed  General Comments      Exercises     Assessment/Plan    PT Assessment Patient needs continued PT services  PT Problem List Decreased strength;Decreased activity tolerance;Decreased mobility;Decreased cognition;Decreased knowledge of use of DME;Decreased safety awareness       PT Treatment Interventions Gait training;Functional mobility training;Therapeutic activities;Balance training;Patient/family  education;DME instruction    PT Goals (Current goals can be found in the Care Plan section)  Acute Rehab PT Goals Patient Stated Goal: pt unable PT Goal Formulation: Patient unable to participate in goal setting Time For Goal Achievement: 09/06/18 Potential to Achieve Goals: Good    Frequency Min 3X/week   Barriers to discharge        Co-evaluation               AM-PAC PT "6 Clicks" Mobility  Outcome Measure Help needed turning from your back to your side while in a flat bed without using bedrails?: A Little Help needed moving from lying on your back to sitting on the side of a flat bed without using bedrails?: A Little Help needed moving to and from a bed to a chair (including a wheelchair)?: A Lot Help needed standing up from a chair using your arms (e.g., wheelchair or bedside chair)?: A Lot Help needed to walk in hospital room?: A Lot Help needed climbing 3-5 steps with a railing? : A Lot 6 Click Score: 14    End of Session   Activity Tolerance: Patient tolerated treatment well Patient left: in bed;with call bell/phone within reach(left with EEG technician) Nurse Communication: Mobility status PT Visit Diagnosis: Other abnormalities of gait and mobility (R26.89);Difficulty in walking, not elsewhere classified (R26.2);Other symptoms and signs involving the nervous system (R29.898)    Time: [id number] PT Time Calculation (min) (ACUTE ONLY): 25 min   Charges:   PT Evaluation $PT Eval Moderate Complexity: 1 Mod PT Treatments $Gait Training: 8-22 mins        08/23/2018  [place] BingKen Davey Limas, PT Acute Rehabilitation Services [phone]  (pager) [phone]  (office)  Jill [name] 08/23/2018, 11:02 AM

## 2018-08-23 NOTE — Procedures (Addendum)
History: 84 year old female being evaluated for an episode of loss of consciousness and confusion  Sedation: None  Technique: This is a 21 channel routine scalp EEG performed at the bedside with bipolar and monopolar montages arranged in accordance to the international 10/20 system of electrode placement. One channel was dedicated to EKG recording.    Background: The majority of this EEG was recorded in the sleep state.  During brief periods of arousal, there is a posterior dominant rhythm of 6 to 7 Hz that is poorly formed and poorly sustained.  In addition there are occasional left frontotemporal (F7 >T7 >Fp1, F3) sharp wave discharges.  Photic stimulation: Physiologic driving is not performed  EEG Abnormalities: 1) occasional left frontotemporal sharp waves 2) generalized irregular slow activity 3) slow posterior dominant rhythm  Clinical Interpretation: This EEG is consistent with an area of potential epileptogenicity in the left frontotemporal region.  There is also evidence of a generalized nonspecific cerebral dysfunction (encephalopathy).  There was no seizure recorded on this study.   Jill SlotMcNeill Lasheba Stevens, MD [facility] [phone]  If 7pm- 7am, please page neurology on call as listed in AMION.

## 2018-08-23 NOTE — Evaluation (Signed)
SLP Cancellation Note  Patient Details Name: Jill [name] MRN: [id number] DOB: [birth year]   Cancelled treatment:       Reason Eval/Treat Not Completed: Other (comment)(pt off floor for testing)   Chales AbrahamsKimball, Kacey Dysert Ann 08/23/2018, 8:13 AM   Donavan Burnetamara Floris Neuhaus, MS [facility] S[facility] Acute Rehab Services Pager [phone] Office [phone]

## 2018-08-24 LAB — CBC
HCT: 32 % — ABNORMAL LOW (ref 36.0–46.0)
Hemoglobin: 10.1 g/dL — ABNORMAL LOW (ref 12.0–15.0)
MCH: 29 pg (ref 26.0–34.0)
MCHC: 31.6 g/dL (ref 30.0–36.0)
MCV: 92 fL (ref 80.0–100.0)
Platelets: 233 10*3/uL (ref 150–400)
RBC: 3.48 MIL/uL — ABNORMAL LOW (ref 3.87–5.11)
RDW: 14 % (ref 11.5–15.5)
WBC: 13.1 10*3/uL — ABNORMAL HIGH (ref 4.0–10.5)
nRBC: 0 % (ref 0.0–0.2)

## 2018-08-24 LAB — URINE CULTURE

## 2018-08-24 LAB — BASIC METABOLIC PANEL
Anion gap: 10 (ref 5–15)
BUN: 23 mg/dL (ref 8–23)
CO2: 23 mmol/L (ref 22–32)
Calcium: 8.4 mg/dL — ABNORMAL LOW (ref 8.9–10.3)
Chloride: 107 mmol/L (ref 98–111)
Creatinine, Ser: 0.83 mg/dL (ref 0.44–1.00)
GFR calc Af Amer: >60 mL/min (ref >60)
GFR calc non Af Amer: >60 mL/min (ref >60)
Glucose, Bld: 178 mg/dL — ABNORMAL HIGH (ref 70–99)
Potassium: 3.3 mmol/L — ABNORMAL LOW (ref 3.5–5.1)
Sodium: 140 mmol/L (ref 135–145)

## 2018-08-24 MED ORDER — CEPHALEXIN 500 MG PO CAPS: 500.0000 mg | ORAL_CAPSULE | Freq: Two times a day (BID) | ORAL | 0 refills | Status: AC

## 2018-08-24 MED ORDER — POTASSIUM CHLORIDE CRYS ER 20 MEQ PO TBCR
40.0000 meq | EXTENDED_RELEASE_TABLET | Freq: Once | ORAL | Status: AC
  Administered 2018-08-24: 40 meq via ORAL
  Filled 2018-08-24: qty 2

## 2018-08-24 MED ORDER — ASPIRIN 81 MG PO CHEW: 81.0000 mg | CHEWABLE_TABLET | Freq: Every day | ORAL | 0 refills | Status: AC

## 2018-08-24 MED ORDER — CEPHALEXIN 500 MG PO CAPS: 500.0000 mg | ORAL_CAPSULE | Freq: Two times a day (BID) | ORAL | Status: DC

## 2018-08-24 NOTE — Progress Notes (Signed)
Patient continue appears to be somnolent, able to arouse but eaasily fall back asleep. Per family, pt doesn't take klonopin [facility] which was given last night. MD paged. Aware of condition. RN continue IVF and monitor. VSS. Will continue to monitor.   Sim BoastHavy, RN

## 2018-08-24 NOTE — Discharge Summary (Deleted)
Physician Discharge Summary  Jill HazardVirginia Chamblin ZOX:[id number] DOB: [birth year] DOA: 08/22/2018  PCP: Marcell Angerlark, Candice P, NP  Admit date: 08/22/2018 Discharge date: 08/24/2018  Admitted From: Home Disposition:  Home   Recommendations for Outpatient Follow-up:  1. Follow up with PCP in 1 week  Discharge Condition: Stable CODE STATUS: DNR  Diet recommendation: Dysphagia 3 diet   Brief/Interim Summary: Jill [name] is a 84 yo female with past medical history significant for vascular dementia, is aphasic at baseline, ambulates with assistance, eats with assistance at baseline. She was brought into the hospital due to 2-day history of altered mental status.  She was reported to have some decreased responsiveness than her baseline.  Yesterday morning, she required significantly more assistance than usual getting out of bed, had episode of unresponsiveness at the table with decreased p.o. intake.  She has in general been less interactive than her baseline.  In the emergency department, CT head did not reveal any acute intracranial abnormalities, UA was suggestive of urinary tract infection and patient was started with IV antibiotics.  Discharge Diagnoses:  Principal Problem:   Acute encephalopathy Active Problems:   Dementia, multi-infarct (HCC)   Acute metabolic encephalopathy with advanced vascular dementia -Possibly secondary to UTI -EEG without seizure activity   -Seems to be now at baseline    Advanced vascular dementia with previous strokes -MRI brain which revealed punctate focus of restricted diffusion at the left frontoparietal vertex consistent with tiny acute infarction -Discussed with neurologist on call Dr. Wilford CornerArora who reviewed imaging and feels this was an incidental finding and not contributing to her altered mental status. No further work up recommended  -Aspirin 81mg  daily   Urinary tract infection, present on admission -Urine culture is with aerococcus viridans  susceptibility not available, will prescribe keflex on discharge (tolerated rocephin in hospital without allergic event)   Anxiety -Continue valium prn   Discharge Instructions  Discharge Instructions    Call MD for:  difficulty breathing, headache or visual disturbances   Complete by:  As directed    Call MD for:  extreme fatigue   Complete by:  As directed    Call MD for:  hives   Complete by:  As directed    Call MD for:  persistant dizziness or light-headedness   Complete by:  As directed    Call MD for:  persistant nausea and vomiting   Complete by:  As directed    Call MD for:  severe uncontrolled pain   Complete by:  As directed    Call MD for:  temperature >100.4   Complete by:  As directed    Discharge instructions   Complete by:  As directed    You were cared for by a hospitalist during your hospital stay. If you have any questions about your discharge medications or the care you received while you were in the hospital after you are discharged, you can call the unit and ask to speak with the hospitalist on call if the hospitalist that took care of you is not available. Once you are discharged, your primary care physician will handle any further medical issues. Please note that NO REFILLS for any discharge medications will be authorized once you are discharged, as it is imperative that you return to your primary care physician (or establish a relationship with a primary care physician if you do not have one) for your aftercare needs so that they can reassess your need for medications and monitor your lab values.   Increase  activity slowly   Complete by:  As directed      Allergies as of 08/24/2018      Reactions   Penicillins Rash   DID THE REACTION INVOLVE: Swelling of the face/tongue/throat, SOB, or low BP? Unknown Sudden or severe rash/hives, skin peeling, or the inside of the mouth or nose? Unknown Did it require medical treatment? Unknown When did it last  happen?UNK If all above answers are "NO", may proceed with cephalosporin use.      Medication List    STOP taking these medications   acetaminophen 325 MG tablet Commonly known as:  TYLENOL   aspirin 325 MG EC tablet Replaced by:  aspirin 81 MG chewable tablet   clonazePAM 0.25 MG disintegrating tablet Commonly known as:  KLONOPIN     TAKE these medications   aspirin 81 MG chewable tablet Chew 1 tablet (81 mg total) by mouth daily. Replaces:  aspirin 325 MG EC tablet   cephALEXin 500 MG capsule Commonly known as:  KEFLEX Take 1 capsule (500 mg total) by mouth 2 (two) times daily for 3 days.   diazepam 5 MG tablet Commonly known as:  VALIUM Take 2.5-5 mg by mouth 2 (two) times daily as needed for anxiety.   OVER THE COUNTER MEDICATION Take 500-750 mg by mouth See admin instructions. CBD Oil 500mg  in the morning and 750mg  in the evening   traZODone 50 MG tablet Commonly known as:  DESYREL Take 1 tablet (50 mg total) by mouth at bedtime as needed for sleep. What changed:  reasons to take this      Follow-up Information    Clark, Kathie Dike, NP. Schedule an appointment as soon as possible for a visit in 1 week(s).   Specialty:  Internal Medicine Contact information: [address] [place] [address] [phone]          Allergies  Allergen Reactions  . Penicillins Rash    DID THE REACTION INVOLVE: Swelling of the face/tongue/throat, SOB, or low BP? Unknown Sudden or severe rash/hives, skin peeling, or the inside of the mouth or nose? Unknown Did it require medical treatment? Unknown When did it last happen?UNK If all above answers are "NO", may proceed with cephalosporin use.     Consultations:  None    Procedures/Studies: Dg Chest 2 View  Result Date: 08/22/2018 CLINICAL DATA:  Hypoxia EXAM: CHEST - 2 VIEW COMPARISON:  None. FINDINGS: The lungs are hyperinflated with diffuse interstitial prominence. No focal  airspace consolidation or pulmonary edema. No pleural effusion or pneumothorax. Normal cardiomediastinal contours. IMPRESSION: Hyperinflation without focal airspace disease. Electronically Signed   By: Deatra Robinson M.D.   On: 08/22/2018 16:22   Ct Head Wo Contrast  Result Date: 08/22/2018 CLINICAL DATA:  Altered level of consciousness EXAM: CT HEAD WITHOUT CONTRAST TECHNIQUE: Contiguous axial images were obtained from the base of the skull through the vertex without intravenous contrast. COMPARISON:  12/13/2016 FINDINGS: Brain: No evidence of acute infarction, hemorrhage, hydrocephalus, extra-axial collection or mass lesion/mass effect. Subcortical white matter and periventricular small vessel ischemic changes. Global cortical and central atrophy.  Mild ventricular prominence. Vascular: Intracranial atherosclerosis. Skull: Normal. Negative for fracture or focal lesion. Sinuses/Orbits: Mild mucosal thickening of the bilateral ethmoid and maxillary sinuses. Mastoid air cells are clear. Other: None. IMPRESSION: Atrophy with small vessel ischemic changes. Electronically Signed   By: Charline Bills M.D.   On: 08/22/2018 18:34   Mr Brain Wo Contrast  Result Date: 08/23/2018 CLINICAL DATA:  Vascular dementia. Two day history of worsening mental status. EXAM: MRI HEAD WITHOUT CONTRAST TECHNIQUE: Multiplanar, multiecho pulse sequences of the brain and surrounding structures were obtained without intravenous contrast. COMPARISON:  Head CT 08/22/2018.  MRI 07/28/2014. FINDINGS: Brain: Brain shows advanced generalized atrophy. There are chronic microvascular changes throughout the white matter, basal ganglia and thalami. Diffusion imaging suggests a punctate acute infarction at the left frontoparietal vertex. This may well be incidental. No evidence of mass lesion, hemorrhage, hydrocephalus or extra-axial collection. Vascular: Major vessels at the base of the brain show flow. Skull and upper cervical spine:  Negative Sinuses/Orbits: Clear/normal Other: None IMPRESSION: Advanced generalized atrophy. Chronic microvascular changes throughout. Punctate focus of restricted diffusion at the left frontoparietal vertex consistent with a tiny acute infarction. This could well be subclinical. It seems to be in the region of the post central gyrus. Electronically Signed   By: Paulina FusiMark  Shogry M.D.   On: 08/23/2018 09:48     EEG 12/24 EEG Abnormalities: 1) occasional left frontotemporal sharp waves 2) generalized irregular slow activity 3) slow posterior dominant rhythm  Clinical Interpretation: This EEG is consistent with an area of potential epileptogenicity in the left frontotemporal region.  There is also evidence of a generalized nonspecific cerebral dysfunction (encephalopathy).  There was no seizure recorded on this study.    Discharge Exam: Vitals:   08/24/18 0421 08/24/18 0826  BP: (!) 96/51 (!) 91/35  Pulse: 70 62  Resp: 17 20  Temp: 100 F (37.8 C) 99.3 F (37.4 C)  SpO2: 93% 97%     General: Pt is sleeping soundly, arouses to voice but does not interact or answer questions, frail and thin  Cardiovascular: RRR, S1/S2 +, no rubs, no gallops Respiratory: CTA bilaterally, no wheezing, no rhonchi Abdominal: Soft, NT, ND, bowel sounds + Extremities: no edema, no cyanosis    The results of significant diagnostics from this hospitalization (including imaging, microbiology, ancillary and laboratory) are listed below for reference.     Microbiology: Recent Results (from the past 240 hour(s))  Urine culture     Status: Abnormal   Collection Time: 08/22/18  4:43 PM  Result Value Ref Range Status   Specimen Description URINE, CATHETERIZED  Final   Special Requests NONE  Final   Culture (A)  Final    >=100,000 COLONIES/mL AEROCOCCUS VIRIDANS Standardized susceptibility testing for this organism is not available. Performed at [facility], [address] [place], [place] [address]     Report Status 08/24/2018 FINAL  Final     Labs: BNP (last 3 results) No results for input(s): BNP in the last 8760 hours. Basic Metabolic Panel: Recent Labs  Lab 08/22/18 1655 08/23/18 0525 08/24/18 0342  NA 146* 143 140  K 4.7 3.5 3.3*  CL 109 109 107  CO2 24 22 23   GLUCOSE 129* 82 178*  BUN 24* 22 23  CREATININE 0.89 0.75 0.83  CALCIUM 9.6 8.6* 8.4*   Liver Function Tests: Recent Labs  Lab 08/22/18 1655  AST 21  ALT 12  ALKPHOS 90  BILITOT 0.7  PROT 6.9  ALBUMIN 3.2*   No results for input(s): LIPASE, AMYLASE in the last 168 hours. Recent Labs  Lab 08/22/18 2035  AMMONIA 43*   CBC: Recent Labs  Lab 08/22/18 1655 08/23/18 0525 08/24/18 0342  WBC 8.0 4.7 13.1*  NEUTROABS 7.2  --   --   HGB 11.5* 9.6* 10.1*  HCT 37.9 31.2* 32.0*  MCV 95.5 93.7 92.0  PLT 205  200 233   Cardiac Enzymes: No results for input(s): CKTOTAL, CKMB, CKMBINDEX, TROPONINI in the last 168 hours. BNP: Invalid input(s): POCBNP CBG: No results for input(s): GLUCAP in the last 168 hours. D-Dimer No results for input(s): DDIMER in the last 72 hours. Hgb A1c No results for input(s): HGBA1C in the last 72 hours. Lipid Profile No results for input(s): CHOL, HDL, LDLCALC, TRIG, CHOLHDL, LDLDIRECT in the last 72 hours. Thyroid function studies No results for input(s): TSH, T4TOTAL, T3FREE, THYROIDAB in the last 72 hours.  Invalid input(s): FREET3 Anemia work up No results for input(s): VITAMINB12, FOLATE, FERRITIN, TIBC, IRON, RETICCTPCT in the last 72 hours. Urinalysis    Component Value Date/Time   COLORURINE YELLOW 08/22/2018 1643   APPEARANCEUR CLOUDY (A) 08/22/2018 1643   LABSPEC 1.025 08/22/2018 1643   PHURINE 6.5 08/22/2018 1643   GLUCOSEU NEGATIVE 08/22/2018 1643   HGBUR SMALL (A) 08/22/2018 1643   BILIRUBINUR NEGATIVE 08/22/2018 1643   KETONESUR 15 (A) 08/22/2018 1643   PROTEINUR 30 (A) 08/22/2018 1643   NITRITE NEGATIVE 08/22/2018 1643   LEUKOCYTESUR MODERATE (A)  08/22/2018 1643   Sepsis Labs Invalid input(s): PROCALCITONIN,  WBC,  LACTICIDVEN Microbiology Recent Results (from the past 240 hour(s))  Urine culture     Status: Abnormal   Collection Time: 08/22/18  4:43 PM  Result Value Ref Range Status   Specimen Description URINE, CATHETERIZED  Final   Special Requests NONE  Final   Culture (A)  Final    >=100,000 COLONIES/mL AEROCOCCUS VIRIDANS Standardized susceptibility testing for this organism is not available. Performed at [facility], [address] [place], [place] [address]    Report Status 08/24/2018 FINAL  Final     Patient was seen and examined on the day of discharge and was found to be in stable condition. Time coordinating discharge: 25 minutes including assessment and coordination of care, as well as examination of the patient.   SIGNED:  Noralee Stain, DO [facility] Pager [phone]  If 7PM-7AM, please contact night-coverage [email or web address] Password [facility] 08/24/2018, 9:53 AM

## 2018-08-24 NOTE — Progress Notes (Signed)
PROGRESS NOTE    Jill HazardVirginia Remlinger  ZOX:[id number] DOB: 28-Jan-1929 DOA: 08/22/2018 PCP: Marcell Angerlark, Candice P, NP     Brief Narrative:  Jill [name] is a 84 yo female with past medical history significant for vascular dementia, is aphasic at baseline, ambulates with assistance, eats with assistance at baseline. She was brought into the hospital due to 2-day history of altered mental status.  She was reported to have some decreased responsiveness than her baseline.  Yesterday morning, she required significantly more assistance than usual getting out of bed, had episode of unresponsiveness at the table with decreased p.o. intake.  She has in general been less interactive than her baseline.  In the emergency department, CT head did not reveal any acute intracranial abnormalities, UA was suggestive of urinary tract infection and patient was started with IV antibiotics.  New events last 24 hours / Subjective: Patient was supposed to be discharged home today after antibiotic therapy. Per family, today she is much more sleepy than her usual state. Patient did receive trazodone and klonopin last night, which per family she does not take. Will cancel sleep aids, monitor patient, plan for discharge in AM if stable.   Assessment & Plan:   Principal Problem:   Acute encephalopathy Active Problems:   Dementia, multi-infarct (HCC)  Acute metabolic encephalopathy with advanced vascular dementia -Possibly secondary to UTI -EEG without seizure activity  -Seems to be now at baseline   Advanced vascular dementia with previous strokes -MRI brain which revealed punctate focus of restricted diffusion at the left frontoparietal vertex consistent with tiny acute infarction -Discussed with neurologist on call Dr. Wilford CornerArora who reviewed imaging and feels this was an incidental finding and not contributing to her altered mental status. No further work up recommended  -Aspirin 81mg  daily   Urinary tract infection,  present on admission -Urine culture is with aerococcus viridans susceptibility not available, will prescribe keflex on discharge (tolerated rocephin in hospital without allergic event)   Anxiety -Stop klonopin, trazodone    DVT prophylaxis: Lovenox Code Status: DNR Family Communication: No family at bedside, spoke w daughter over the phone Disposition Plan: Pending improvement, suspect return back home with home health    Consultants:   None  Procedures:   None   Antimicrobials:  Anti-infectives (From admission, onward)   Start     Dose/Rate Route Frequency Ordered Stop   08/24/18 1000  cephALEXin (KEFLEX) capsule 500 mg  Status:  Discontinued     500 mg Oral Every 12 hours 08/24/18 0951 08/24/18 0951   08/24/18 0000  cephALEXin (KEFLEX) 500 MG capsule     500 mg Oral 2 times daily 08/24/18 0952 08/27/18 2359   08/23/18 1800  cefTRIAXone (ROCEPHIN) 1 g in sodium chloride 0.9 % 100 mL IVPB     1 g 200 mL/hr over 30 Minutes Intravenous Every 24 hours 08/22/18 1917     08/22/18 1745  cefTRIAXone (ROCEPHIN) 1 g in sodium chloride 0.9 % 100 mL IVPB     1 g 200 mL/hr over 30 Minutes Intravenous  Once 08/22/18 1730 08/22/18 1810       Objective: Vitals:   08/22/18 2027 08/24/18 0421 08/24/18 0826 08/24/18 1157  BP:  (!) 96/51 (!) 91/35 (!) 92/49  Pulse:  70 62 67  Resp:  17 20 17   Temp:  100 F (37.8 C) 99.3 F (37.4 C) 99.2 F (37.3 C)  TempSrc:  Oral Oral Oral  SpO2:  93% 97% 96%  Weight: 36.1 kg  Height: 5\' 1"  (1.549 m)       Intake/Output Summary (Last 24 hours) at 08/24/2018 1225 Last data filed at 08/24/2018 1051 Gross per 24 hour  Intake 1017.23 ml  Output -  Net 1017.23 ml   Filed Weights   08/22/18 2027  Weight: 36.1 kg    Examination: General exam: Appears calm and comfortable, sleeping soundly, briefly arouses to voice but does not interact or answer questions, frail, thin  Respiratory system: Clear to auscultation. Respiratory effort  normal. Cardiovascular system: S1 & S2 heard, RRR. No JVD, murmurs, rubs, gallops or clicks. No pedal edema. Gastrointestinal system: Abdomen is nondistended, soft and nontender Central nervous system: Nonfocal  Extremities: Symmetric. Skin: No rashes, lesions or ulcers    Data Reviewed: I have personally reviewed following labs and imaging studies  CBC: Recent Labs  Lab 08/22/18 1655 08/23/18 0525 08/24/18 0342  WBC 8.0 4.7 13.1*  NEUTROABS 7.2  --   --   HGB 11.5* 9.6* 10.1*  HCT 37.9 31.2* 32.0*  MCV 95.5 93.7 92.0  PLT 205 200 233   Basic Metabolic Panel: Recent Labs  Lab 08/22/18 1655 08/23/18 0525 08/24/18 0342  NA 146* 143 140  K 4.7 3.5 3.3*  CL 109 109 107  CO2 24 22 23   GLUCOSE 129* 82 178*  BUN 24* 22 23  CREATININE 0.89 0.75 0.83  CALCIUM 9.6 8.6* 8.4*   GFR: Estimated Creatinine Clearance: 27.2 mL/min (by C-G formula based on SCr of 0.83 mg/dL). Liver Function Tests: Recent Labs  Lab 08/22/18 1655  AST 21  ALT 12  ALKPHOS 90  BILITOT 0.7  PROT 6.9  ALBUMIN 3.2*   No results for input(s): LIPASE, AMYLASE in the last 168 hours. Recent Labs  Lab 08/22/18 2035  AMMONIA 43*   Coagulation Profile: No results for input(s): INR, PROTIME in the last 168 hours. Cardiac Enzymes: No results for input(s): CKTOTAL, CKMB, CKMBINDEX, TROPONINI in the last 168 hours. BNP (last 3 results) No results for input(s): PROBNP in the last 8760 hours. HbA1C: No results for input(s): HGBA1C in the last 72 hours. CBG: No results for input(s): GLUCAP in the last 168 hours. Lipid Profile: No results for input(s): CHOL, HDL, LDLCALC, TRIG, CHOLHDL, LDLDIRECT in the last 72 hours. Thyroid Function Tests: No results for input(s): TSH, T4TOTAL, FREET4, T3FREE, THYROIDAB in the last 72 hours. Anemia Panel: No results for input(s): VITAMINB12, FOLATE, FERRITIN, TIBC, IRON, RETICCTPCT in the last 72 hours. Sepsis Labs: Recent Labs  Lab 08/22/18 1704 08/22/18 1711   LATICACIDVEN 1.48 1.57    Recent Results (from the past 240 hour(s))  Urine culture     Status: Abnormal   Collection Time: 08/22/18  4:43 PM  Result Value Ref Range Status   Specimen Description URINE, CATHETERIZED  Final   Special Requests NONE  Final   Culture (A)  Final    >=100,000 COLONIES/mL AEROCOCCUS VIRIDANS Standardized susceptibility testing for this organism is not available. Performed at [facility], [address] [place], [place] [address]    Report Status 08/24/2018 FINAL  Final       Radiology Studies: Dg Chest 2 View  Result Date: 08/22/2018 CLINICAL DATA:  Hypoxia EXAM: CHEST - 2 VIEW COMPARISON:  None. FINDINGS: The lungs are hyperinflated with diffuse interstitial prominence. No focal airspace consolidation or pulmonary edema. No pleural effusion or pneumothorax. Normal cardiomediastinal contours. IMPRESSION: Hyperinflation without focal airspace disease. Electronically Signed   By: Deatra RobinsonKevin  Herman M.D.   On:  08/22/2018 16:22   Ct Head Wo Contrast  Result Date: 08/22/2018 CLINICAL DATA:  Altered level of consciousness EXAM: CT HEAD WITHOUT CONTRAST TECHNIQUE: Contiguous axial images were obtained from the base of the skull through the vertex without intravenous contrast. COMPARISON:  12/13/2016 FINDINGS: Brain: No evidence of acute infarction, hemorrhage, hydrocephalus, extra-axial collection or mass lesion/mass effect. Subcortical white matter and periventricular small vessel ischemic changes. Global cortical and central atrophy.  Mild ventricular prominence. Vascular: Intracranial atherosclerosis. Skull: Normal. Negative for fracture or focal lesion. Sinuses/Orbits: Mild mucosal thickening of the bilateral ethmoid and maxillary sinuses. Mastoid air cells are clear. Other: None. IMPRESSION: Atrophy with small vessel ischemic changes. Electronically Signed   By: Charline Bills M.D.   On: 08/22/2018 18:34   Mr Brain Wo Contrast  Result Date:  08/23/2018 CLINICAL DATA:  Vascular dementia. Two day history of worsening mental status. EXAM: MRI HEAD WITHOUT CONTRAST TECHNIQUE: Multiplanar, multiecho pulse sequences of the brain and surrounding structures were obtained without intravenous contrast. COMPARISON:  Head CT 08/22/2018.  MRI 07/28/2014. FINDINGS: Brain: Brain shows advanced generalized atrophy. There are chronic microvascular changes throughout the white matter, basal ganglia and thalami. Diffusion imaging suggests a punctate acute infarction at the left frontoparietal vertex. This may well be incidental. No evidence of mass lesion, hemorrhage, hydrocephalus or extra-axial collection. Vascular: Major vessels at the base of the brain show flow. Skull and upper cervical spine: Negative Sinuses/Orbits: Clear/normal Other: None IMPRESSION: Advanced generalized atrophy. Chronic microvascular changes throughout. Punctate focus of restricted diffusion at the left frontoparietal vertex consistent with a tiny acute infarction. This could well be subclinical. It seems to be in the region of the post central gyrus. Electronically Signed   By: Paulina Fusi M.D.   On: 08/23/2018 09:48      Scheduled Meds: . aspirin  81 mg Oral Daily  . enoxaparin (LOVENOX) injection  30 mg Subcutaneous Q24H   Continuous Infusions: . sodium chloride 75 mL/hr at 08/23/18 2224  . cefTRIAXone (ROCEPHIN)  IV 1 g (08/24/18 1051)     LOS: 2 days    Time spent: 25 minutes   Noralee Stain, DO [facility] [email or web address] Password [facility] 08/24/2018, 12:25 PM

## 2018-08-24 NOTE — Progress Notes (Signed)
Patient still appears somnolent this AM. Pericare and turn provided. Attempted to feed patient but pt declined at this time. RN spoke with pt's daughter n dgt verbalized that pt is not a morning person and will eat at  Alter time. Will cont to monitor,  Sim BoastHavy, RN

## 2018-08-24 NOTE — Care Management Note (Signed)
Case Management Note  Patient Details  Name: Jill [name] MRN: [id number] Date of Birth: Apr 22, 1929  Subjective/ObjeWynonia Hazardctive:    For dc today, NCM spoke with daughter, [facility], [facility] [place] [facility].  Soc will begin 24-48 hrs post dc.                 Action/Plan: DC home when ready.  Expected Discharge Date:  08/24/18               Expected Discharge Plan:  Home w Home Health Services  In-House Referral:     Discharge planning Services  CM Consult  Post Acute Care Choice:  Home Health Choice offered to:  Adult Children  DME Arranged:    DME Agency:     HH Arranged:  PT, OT HH Agency[facility]  [facility]  Status of Service:  Completed, signed off  If discussed at Long Length of Stay Meetings, dates discussed:    Additional Comments:  Leone Havenaylor, Vaniya Augspurger Clinton, RN 08/24/2018, 10:35 AM

## 2018-08-25 LAB — CBC
HCT: 28.8 % — ABNORMAL LOW (ref 36.0–46.0)
Hemoglobin: 9 g/dL — ABNORMAL LOW (ref 12.0–15.0)
MCH: 28.9 pg (ref 26.0–34.0)
MCHC: 31.3 g/dL (ref 30.0–36.0)
MCV: 92.6 fL (ref 80.0–100.0)
Platelets: 210 10*3/uL (ref 150–400)
RBC: 3.11 MIL/uL — ABNORMAL LOW (ref 3.87–5.11)
RDW: 14.2 % (ref 11.5–15.5)
WBC: 5.5 10*3/uL (ref 4.0–10.5)
nRBC: 0 % (ref 0.0–0.2)

## 2018-08-25 LAB — BASIC METABOLIC PANEL
Anion gap: 6 (ref 5–15)
BUN: 14 mg/dL (ref 8–23)
CO2: 25 mmol/L (ref 22–32)
Calcium: 8.1 mg/dL — ABNORMAL LOW (ref 8.9–10.3)
Chloride: 111 mmol/L (ref 98–111)
Creatinine, Ser: 0.82 mg/dL (ref 0.44–1.00)
GFR calc Af Amer: >60 mL/min (ref >60)
GFR calc non Af Amer: >60 mL/min (ref >60)
Glucose, Bld: 93 mg/dL (ref 70–99)
Potassium: 3.1 mmol/L — ABNORMAL LOW (ref 3.5–5.1)
SODIUM: 142 mmol/L (ref 135–145)

## 2018-08-25 MED ORDER — POTASSIUM CHLORIDE CRYS ER 20 MEQ PO TBCR
40.0000 meq | EXTENDED_RELEASE_TABLET | ORAL | Status: AC
  Administered 2018-08-25 (×2): 40 meq via ORAL
  Filled 2018-08-25 (×2): qty 2

## 2018-08-25 NOTE — Care Management Note (Signed)
Case Management Note  Patient Details  Name: Wynonia HazardVirginia Turnbo MRN: [id number] Date of Birth: 1929-01-16  Subjective/Objective:                    Action/Plan: [facility].  Pt has transportation home.   Expected Discharge Date:  08/25/18               Expected Discharge Plan:  Home w Home Health Services  In-House Referral:     Discharge planning Services  CM Consult  Post Acute Care Choice:  Home Health Choice offered to:  Adult Children  DME Arranged:    DME Agency:     HH Arranged:  PT, OT HH Agency[facility]  [facility]  Status of Service:  Completed, signed off  If discussed at Long Length of Stay Meetings, dates discussed:    Additional Comments:  Kermit BaloKelli F Myrtis Maille, RN 08/25/2018, 11:15 AM

## 2018-08-25 NOTE — Progress Notes (Signed)
Physical Therapy Treatment Patient Details Name: Jill [name] Notte MRN: [id number] DOB: [birth year] Today's Date: 08/25/2018    History of Present Illness Jill [name] Poellnitz is a 84 y.o. female with medical history significant of Vascular dementia, aphasic at baseline, ambulates with assistance, eats without assistance.  pt presented to ED somewhat less responsive than usual, requiring more assistance than usual.  UA suggestive of UTI.    PT Comments    Pt continues to perform with poor balance and cognitive deficits. Pt performed gait training with RW to assess if use of device improves balance.  She pushed the RW too far forward and ultimately stopped using the device.  Pt utilized HHA and railing on opposing side and balance was improved.  Per patient's daughter post tx she does not use equipment at home and is more accustomed to using HHA. Plan for HHPT with 24 hour assistance remains appropriate at this time.     Follow Up Recommendations  Home health PT;Supervision/Assistance - 24 hour(unless 24 hour assistance is not present then she will require SNF placement.  )     Equipment Recommendations  None recommended by PT    Recommendations for Other Services       Precautions / Restrictions Precautions Precautions: Fall Restrictions Weight Bearing Restrictions: No    Mobility  Bed Mobility Overal bed mobility: Needs Assistance Bed Mobility: Supine to Sit     Supine to sit: Max assist     General bed mobility comments: Pt unable to follow verbal commands, responds with increased time to tactile cues but ultimately requires assistance to advance LEs to edge of bed and to elevate trunk into sitting at edge of bed.   Transfers Overall transfer level: Needs assistance Equipment used: 1 person hand held assist;Rolling walker (2 wheeled) Transfers: Sit to/from Stand Sit to Stand: Max assist         General transfer comment: Cues for weight shifting forward and pushing through B  LEs.  Pt required cues for upper trunk control once in standing and cues for hand placement on RW.    Ambulation/Gait Ambulation/Gait assistance: Mod assist;Max assist;+2 safety/equipment Gait Distance (Feet): 40 Feet Assistive device: Rolling walker (2 wheeled);1 person hand held assist Gait Pattern/deviations: Step-through pattern;Scissoring;Narrow base of support;Trunk flexed;Decreased stride length     General Gait Details: Pt used RW for majority of tx but used innapropriately and advanced RW too far forward.  Pt responded better holding to railing with HHA on opposing side.  Pt slow and unsteady during gt training.     Stairs             Wheelchair Mobility    Modified Rankin (Stroke Patients Only)       Balance Overall balance assessment: Needs assistance   Sitting balance-Leahy Scale: Fair       Standing balance-Leahy Scale: Poor Standing balance comment: reliant on external support                            Cognition Arousal/Alertness: Awake/alert Behavior During Therapy: Flat affect Overall Cognitive Status: Difficult to assess(history of cognitive impairments at baseline.  )                                 General Comments: pt with baseline dementia, grossly follows one step commands <25% of the time      Exercises  General Comments        Pertinent Vitals/Pain Pain Assessment: Faces Faces Pain Scale: No hurt    Home Living                      Prior Function            PT Goals (current goals can now be found in the care plan section) Acute Rehab PT Goals Patient Stated Goal: pt unable Potential to Achieve Goals: Fair Progress towards PT goals: Progressing toward goals    Frequency    Min 3X/week      PT Plan Current plan remains appropriate    Co-evaluation              AM-PAC PT "6 Clicks" Mobility   Outcome Measure  Help needed turning from your back to your side while in a  flat bed without using bedrails?: A Little Help needed moving from lying on your back to sitting on the side of a flat bed without using bedrails?: A Little Help needed moving to and from a bed to a chair (including a wheelchair)?: A Lot Help needed standing up from a chair using your arms (e.g., wheelchair or bedside chair)?: A Lot Help needed to walk in hospital room?: A Lot Help needed climbing 3-5 steps with a railing? : A Lot 6 Click Score: 14    End of Session Equipment Utilized During Treatment: Gait belt Activity Tolerance: Patient tolerated treatment well Patient left: in chair;with call bell/phone within reach;with chair alarm set Nurse Communication: Mobility status PT Visit Diagnosis: Other abnormalities of gait and mobility (R26.89);Difficulty in walking, not elsewhere classified (R26.2);Other symptoms and signs involving the nervous system (R60.454(R29.898)     Time: [id number] PT Time Calculation (min) (ACUTE ONLY): 17 min  Charges:  $Gait Training: 8-22 mins                     Joycelyn RuaAimee Richelle Glick, PTA Acute Rehabilitation Services Pager [phone] Office [phone]     Zigmund Linse Artis DelayJ Ory Elting 08/25/2018, 10:33 AM

## 2018-08-25 NOTE — Care Management Important Message (Signed)
Important Message  Patient Details  Name: Jill HazardVirginia Gorney MRN: [id number] Date of Birth: [birth year]   Medicare Important Message Given:  Yes    Oralia RudMegan P Fuad Forget 08/25/2018, 4:51 PM

## 2018-08-25 NOTE — Progress Notes (Signed)
Patient is discharging home with Home Health. Daughter is here for transport. All discharge paperwork went over with family and patient. All questions and concerns addressed. All belongings sent with patient. Patient taken down in wheelchair. Tyron RussellKelsey P Geoge Lawrance

## 2018-08-25 NOTE — Discharge Summary (Addendum)
Physician Discharge Summary  Jill HazardVirginia Balboa ZOX:[id number] DOB: 05-16-1929 DOA: 08/22/2018  PCP: Marcell Angerlark, Candice P, NP  Admit date: 08/22/2018 Discharge date: 08/25/2018  Admitted From: Home Disposition:  Home   Recommendations for Outpatient Follow-up:  1. Follow up with PCP in 1 week  Home Health: PT OT RN  Equipment/Devices: None   Discharge Condition: Stable CODE STATUS: DNR  Diet recommendation: Dysphagia 3 diet   Brief/Interim Summary: Jill [name] a 84 yo female with past medical history significant for vascular dementia, is aphasic at baseline, ambulates with assistance, eats with assistance at baseline. She was broughtinto the hospital due to 2-day history of altered mental status. She was reported to have some decreased responsiveness than her baseline. Yesterday morning, she required significantly more assistance than usual getting out of bed, had episode of unresponsiveness at the table with decreased p.o. intake. She has in general been less interactive than her baseline. In the emergency department, CT head did not reveal any acute intracranial abnormalities,UA was suggestive of urinary tract infection and patient was started with IV antibiotics.  Discharge Diagnoses:  Principal Problem:   Acute encephalopathy Active Problems:   Dementia, multi-infarct (HCC)   Acutemetabolicencephalopathy with advanced vascular dementia -Possibly secondary to UTI -EEG without seizure activity  -Seems to be now at baseline   Advanced vascular dementia with previous strokes -MRI brain which revealed punctate focus of restricted diffusion at the left frontoparietal vertex consistent with tiny acute infarction -Discussed with neurologist on call Dr. Wilford CornerArora who reviewed imaging and feels this was an incidental finding and not contributing to her altered mental status. No further work up recommended -Aspirin 81mg  daily  Urinary tract infection, present on  admission -Urine culture is with aerococcus viridans susceptibility not available, will prescribe keflex on discharge (tolerated rocephin in hospital without allergic event)   Anxiety -Patient no longer taking klonopin or trazodone   Severe malnutrition in context of chronic illness   Discharge Instructions  Discharge Instructions    Call MD for:  difficulty breathing, headache or visual disturbances   Complete by:  As directed    Call MD for:  extreme fatigue   Complete by:  As directed    Call MD for:  hives   Complete by:  As directed    Call MD for:  persistant dizziness or light-headedness   Complete by:  As directed    Call MD for:  persistant nausea and vomiting   Complete by:  As directed    Call MD for:  severe uncontrolled pain   Complete by:  As directed    Call MD for:  temperature >100.4   Complete by:  As directed    Discharge instructions   Complete by:  As directed    You were cared for by a hospitalist during your hospital stay. If you have any questions about your discharge medications or the care you received while you were in the hospital after you are discharged, you can call the unit and ask to speak with the hospitalist on call if the hospitalist that took care of you is not available. Once you are discharged, your primary care physician will handle any further medical issues. Please note that NO REFILLS for any discharge medications will be authorized once you are discharged, as it is imperative that you return to your primary care physician (or establish a relationship with a primary care physician if you do not have one) for your aftercare needs so that they can reassess your  need for medications and monitor your lab values.   Increase activity slowly   Complete by:  As directed      Allergies as of 08/25/2018      Reactions   Penicillins Rash   DID THE REACTION INVOLVE: Swelling of the face/tongue/throat, SOB, or low BP? Unknown Sudden or severe  rash/hives, skin peeling, or the inside of the mouth or nose? Unknown Did it require medical treatment? Unknown When did it last happen?UNK If all above answers are "NO", may proceed with cephalosporin use.      Medication List    STOP taking these medications   acetaminophen 325 MG tablet Commonly known as:  TYLENOL   aspirin 325 MG EC tablet Replaced by:  aspirin 81 MG chewable tablet   clonazePAM 0.25 MG disintegrating tablet Commonly known as:  KLONOPIN   traZODone 50 MG tablet Commonly known as:  DESYREL     TAKE these medications   aspirin 81 MG chewable tablet Chew 1 tablet (81 mg total) by mouth daily. Replaces:  aspirin 325 MG EC tablet   cephALEXin 500 MG capsule Commonly known as:  KEFLEX Take 1 capsule (500 mg total) by mouth 2 (two) times daily for 3 days.   diazepam 5 MG tablet Commonly known as:  VALIUM Take 2.5-5 mg by mouth 2 (two) times daily as needed for anxiety.   OVER THE COUNTER MEDICATION Take 500-750 mg by mouth See admin instructions. CBD Oil  in the morning and  in the evening      Follow-up Information    Clark, Candice P, NP. Schedule an appointment as soon as possible for a visit in 1 week(s).   Specialty:  Internal Medicine Contact information: [address] [place] [address] [phone]        [facility] Follow up.   Specialty:  Home Health Services Why:  HHPT/HHOT Contact information: [address] [place] [address] [phone]          Allergies  Allergen Reactions  . Penicillins Rash    DID THE REACTION INVOLVE: Swelling of the face/tongue/throat, SOB, or low BP? Unknown Sudden or severe rash/hives, skin peeling, or the inside of the mouth or nose? Unknown Did it require medical treatment? Unknown When did it last happen?UNK If all above answers are "NO", may proceed with cephalosporin use.     Consultations:  None     Procedures/Studies: Dg Chest 2 View  Result Date: 08/22/2018 CLINICAL DATA:  Hypoxia EXAM: CHEST - 2 VIEW COMPARISON:  None. FINDINGS: The lungs are hyperinflated with diffuse interstitial prominence. No focal airspace consolidation or pulmonary edema. No pleural effusion or pneumothorax. Normal cardiomediastinal contours. IMPRESSION: Hyperinflation without focal airspace disease. Electronically Signed   By: Deatra Robinson M.D.   On: 08/22/2018 16:22   Ct Head Wo Contrast  Result Date: 08/22/2018 CLINICAL DATA:  Altered level of consciousness EXAM: CT HEAD WITHOUT CONTRAST TECHNIQUE: Contiguous axial images were obtained from the base of the skull through the vertex without intravenous contrast. COMPARISON:  12/13/2016 FINDINGS: Brain: No evidence of acute infarction, hemorrhage, hydrocephalus, extra-axial collection or mass lesion/mass effect. Subcortical white matter and periventricular small vessel ischemic changes. Global cortical and central atrophy.  Mild ventricular prominence. Vascular: Intracranial atherosclerosis. Skull: Normal. Negative for fracture or focal lesion. Sinuses/Orbits: Mild mucosal thickening of the bilateral ethmoid and maxillary sinuses. Mastoid air cells are clear. Other: None. IMPRESSION: Atrophy with small vessel ischemic changes. Electronically Signed  By: Charline Bills M.D.   On: 08/22/2018 18:34   Mr Brain Wo Contrast  Result Date: 08/23/2018 CLINICAL DATA:  Vascular dementia. Two day history of worsening mental status. EXAM: MRI HEAD WITHOUT CONTRAST TECHNIQUE: Multiplanar, multiecho pulse sequences of the brain and surrounding structures were obtained without intravenous contrast. COMPARISON:  Head CT 08/22/2018.  MRI 07/28/2014. FINDINGS: Brain: Brain shows advanced generalized atrophy. There are chronic microvascular changes throughout the white matter, basal ganglia and thalami. Diffusion imaging suggests a punctate acute infarction at the left  frontoparietal vertex. This may well be incidental. No evidence of mass lesion, hemorrhage, hydrocephalus or extra-axial collection. Vascular: Major vessels at the base of the brain show flow. Skull and upper cervical spine: Negative Sinuses/Orbits: Clear/normal Other: None IMPRESSION: Advanced generalized atrophy. Chronic microvascular changes throughout. Punctate focus of restricted diffusion at the left frontoparietal vertex consistent with a tiny acute infarction. This could well be subclinical. It seems to be in the region of the post central gyrus. Electronically Signed   By: Paulina Fusi M.D.   On: 08/23/2018 09:48      Discharge Exam: Vitals:   08/25/18 0332 08/25/18 0733  BP: (!) 132/92 (!) 134/50  Pulse: 68 72  Resp: 17 18  Temp: 98 F (36.7 C) 98.4 F (36.9 C)  SpO2: 92% 93%    General: Pt is alert, awake, not in acute distress Cardiovascular: RRR, S1/S2 +, no rubs, no gallops Respiratory: CTA bilaterally, no wheezing, no rhonchi Abdominal: Soft, NT, ND, bowel sounds + Extremities: no edema, no cyanosis Neuro: Alert, nonverbal which is her baseline     The results of significant diagnostics from this hospitalization (including imaging, microbiology, ancillary and laboratory) are listed below for reference.     Microbiology: Recent Results (from the past 240 hour(s))  Urine culture     Status: Abnormal   Collection Time: 08/22/18  4:43 PM  Result Value Ref Range Status   Specimen Description URINE, CATHETERIZED  Final   Special Requests NONE  Final   Culture (A)  Final    >=100,000 COLONIES/mL AEROCOCCUS VIRIDANS Standardized susceptibility testing for this organism is not available. Performed at [facility], [address] [place], [place] [address]    Report Status 08/24/2018 FINAL  Final     Labs: BNP (last 3 results) No results for input(s): BNP in the last 8760 hours. Basic Metabolic Panel: Recent Labs  Lab 08/22/18 1655 08/23/18 0525  08/24/18 0342 08/25/18 0431  NA 146* 143 140 142  K 4.7 3.5 3.3* 3.1*  CL 109 109 107 111  CO2 GLUCOSE 129* 82 178* 93  BUN 24* CREATININE 0.89 0.75 0.83 0.82  CALCIUM 9.6 8.6* 8.4* 8.1*   Liver Function Tests: Recent Labs  Lab 08/22/18 1655  AST 21  ALT 12  ALKPHOS 90  BILITOT 0.7  PROT 6.9  ALBUMIN 3.2*   No results for input(s): LIPASE, AMYLASE in the last 168 hours. Recent Labs  Lab 08/22/18 2035  AMMONIA 43*   CBC: Recent Labs  Lab 08/22/18 1655 08/23/18 0525 08/24/18 0342 08/25/18 0431  WBC 8.0 4.7 13.1* 5.5  NEUTROABS 7.2  --   --   --   HGB 11.5* 9.6* 10.1* 9.0*  HCT 37.9 31.2* 32.0* 28.8*  MCV 95.5 93.7 92.0 92.6  PLT 205 200 233 210   Cardiac Enzymes: No results for input(s): CKTOTAL, CKMB, CKMBINDEX, TROPONINI in the last 168 hours. BNP: Invalid  input(s): POCBNP CBG: No results for input(s): GLUCAP in the last 168 hours. D-Dimer No results for input(s): DDIMER in the last 72 hours. Hgb A1c No results for input(s): HGBA1C in the last 72 hours. Lipid Profile No results for input(s): CHOL, HDL, LDLCALC, TRIG, CHOLHDL, LDLDIRECT in the last 72 hours. Thyroid function studies No results for input(s): TSH, T4TOTAL, T3FREE, THYROIDAB in the last 72 hours.  Invalid input(s): FREET3 Anemia work up No results for input(s): VITAMINB12, FOLATE, FERRITIN, TIBC, IRON, RETICCTPCT in the last 72 hours. Urinalysis    Component Value Date/Time   COLORURINE YELLOW 08/22/2018 1643   APPEARANCEUR CLOUDY (A) 08/22/2018 1643   LABSPEC 1.025 08/22/2018 1643   PHURINE 6.5 08/22/2018 1643   GLUCOSEU NEGATIVE 08/22/2018 1643   HGBUR SMALL (A) 08/22/2018 1643   BILIRUBINUR NEGATIVE 08/22/2018 1643   KETONESUR 15 (A) 08/22/2018 1643   PROTEINUR 30 (A) 08/22/2018 1643   NITRITE NEGATIVE 08/22/2018 1643   LEUKOCYTESUR MODERATE (A) 08/22/2018 1643   Sepsis Labs Invalid input(s): PROCALCITONIN,  WBC,  LACTICIDVEN Microbiology Recent  Results (from the past 240 hour(s))  Urine culture     Status: Abnormal   Collection Time: 08/22/18  4:43 PM  Result Value Ref Range Status   Specimen Description URINE, CATHETERIZED  Final   Special Requests NONE  Final   Culture (A)  Final    >=100,000 COLONIES/mL AEROCOCCUS VIRIDANS Standardized susceptibility testing for this organism is not available. Performed at [facility], [address], [place], [place] [address]    Report Status 08/24/2018 FINAL  Final     Patient was seen and examined on the day of discharge and was found to be in stable condition. Time coordinating discharge: 25 minutes including assessment and coordination of care, as well as examination of the patient.   SIGNED:  Noralee Stain, DO [facility] Pager [phone]  If 7PM-7AM, please contact night-coverage [email or web address] Password [facility] 08/25/2018, 9:17 AM

## 2018-10-01 DEATH — deceased

## 2019-04-17 IMAGING — MR MR HEAD W/O CM
8 of 10 series · 36 of 48 positions shown · non-contrast
Comparison: Head CT 08/22/2018.  MRI 07/28/2014.

CLINICAL DATA: Vascular dementia. Two day history of worsening
mental status.

EXAM:
MRI HEAD WITHOUT CONTRAST
TECHNIQUE: Multiplanar, multiecho pulse sequences of the brain and surrounding
structures were obtained without intravenous contrast.

[Series 3: DWI · axial · 3.0mm · 1.09mm/px · z∈[-24,+107]mm · 9 of 90 slices shown (1 of 4)]
[im 1/90]
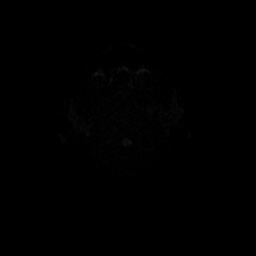
[im 12/90]
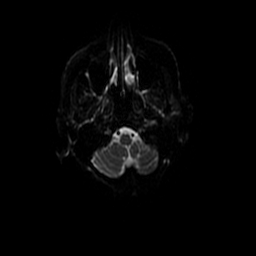
[im 23/90]
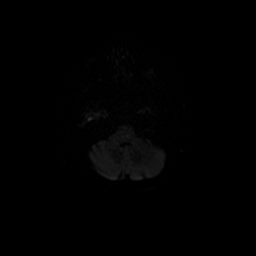
[im 34/90]
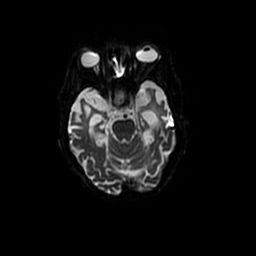
[im 45/90]
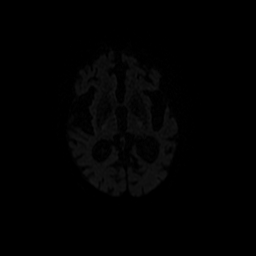
[im 56/90]
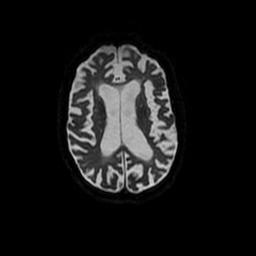
[im 67/90]
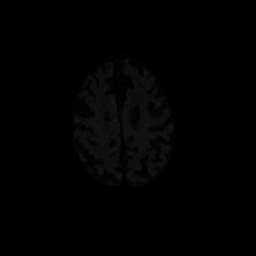
[im 78/90]
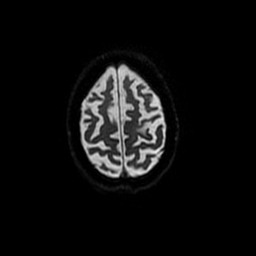
[im 90/90]
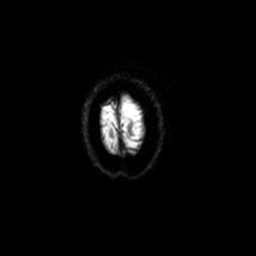

[Series 4: DWI · coronal · 5.0mm · 1.09mm/px · 7 of 66 slices shown (2 of 4)]
[im 1/66]
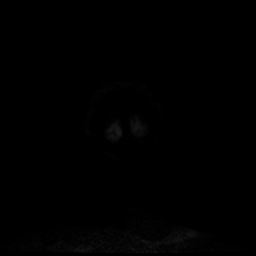
[im 11/66]
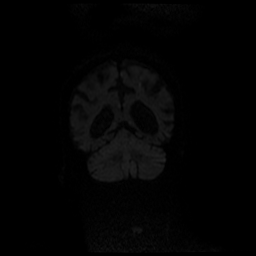
[im 22/66]
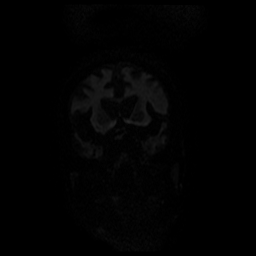
[im 33/66]
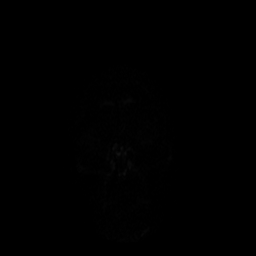
[im 44/66]
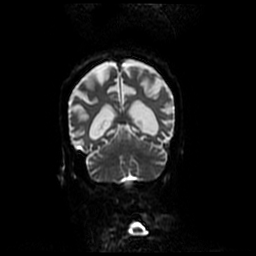
[im 55/66]
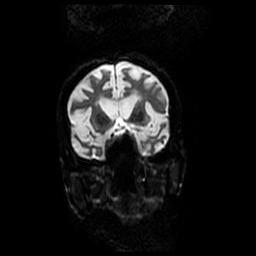
[im 66/66]
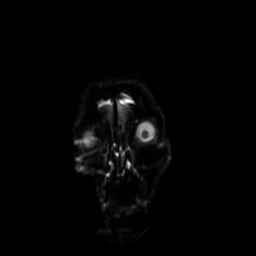

[Series 5: T1 · sagittal · 5.0mm · 0.47mm/px · 2 of 23 slices shown]
[im 1/23]
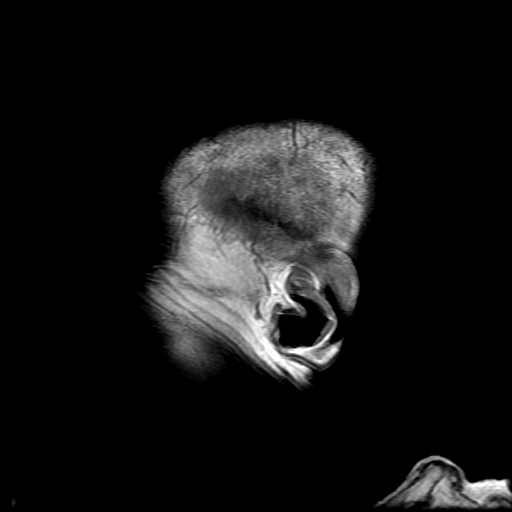
[im 23/23]
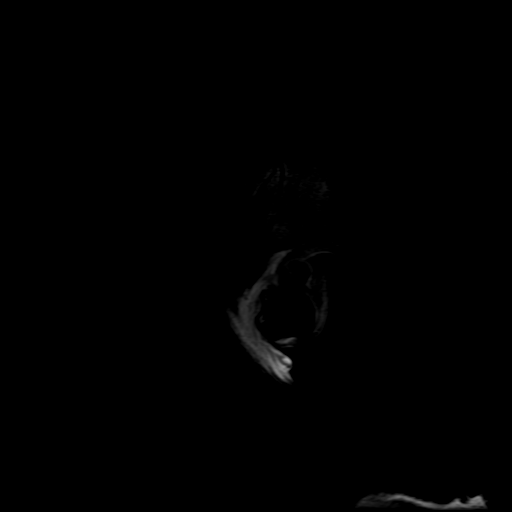

[Series 6: T2 · axial · 5.0mm · 0.43mm/px · z∈[-33,+103]mm · 3 of 24 slices shown (1 of 2)]
[im 1/24]
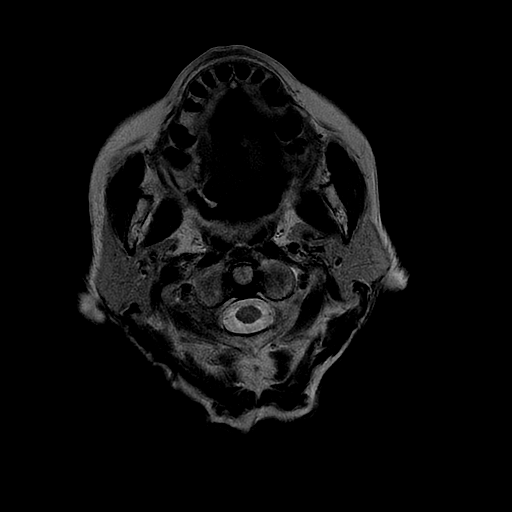
[im 12/24]
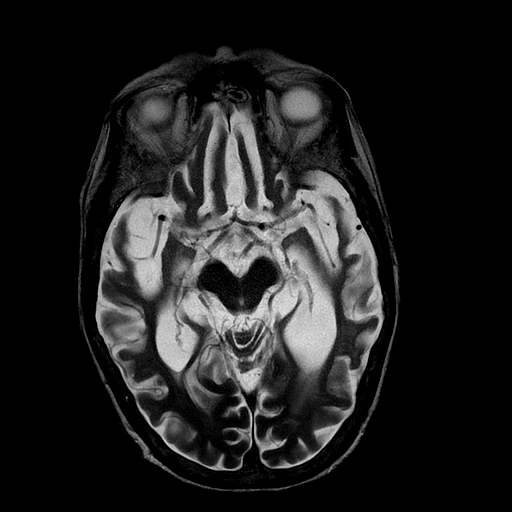
[im 24/24]
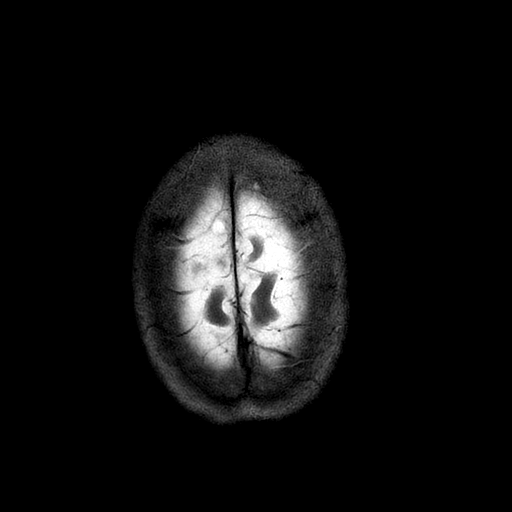

[Series 7: FLAIR · axial · 5.0mm · 0.43mm/px · z∈[-33,+104]mm · 3 of 24 slices shown]
[im 1/24]
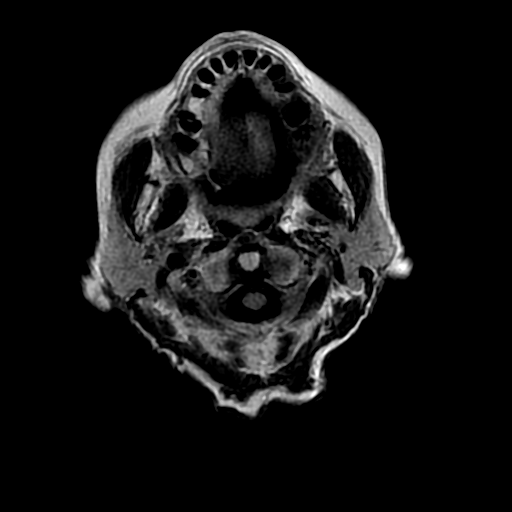
[im 12/24]
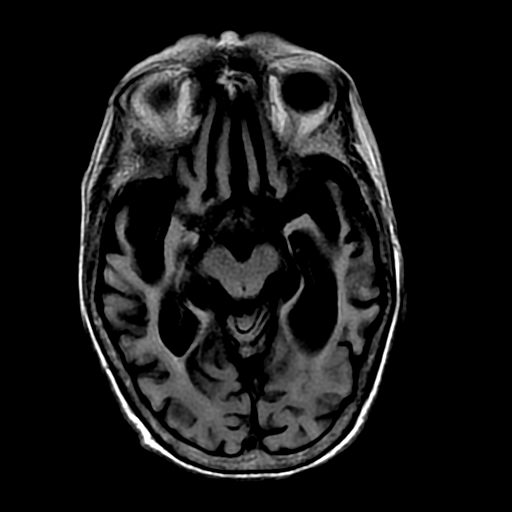
[im 24/24]
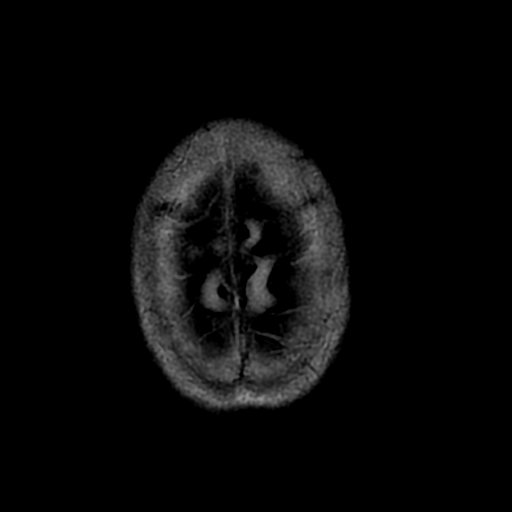

[Series 10: T2 · coronal · 5.0mm · 0.39mm/px · 3 of 25 slices shown (2 of 2)]
[im 1/25]
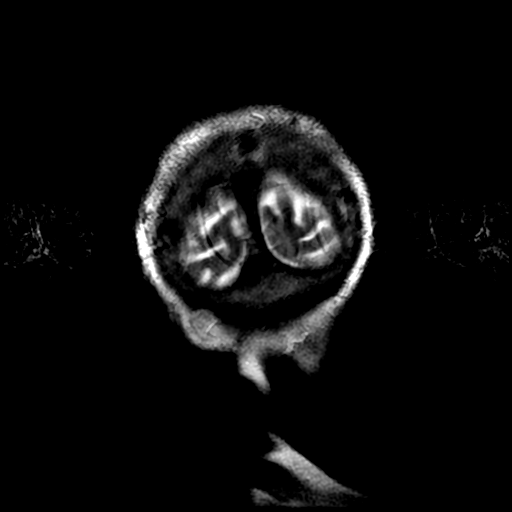
[im 13/25]
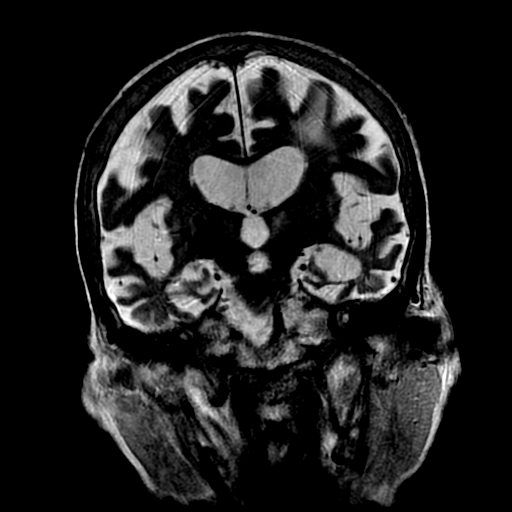
[im 25/25]
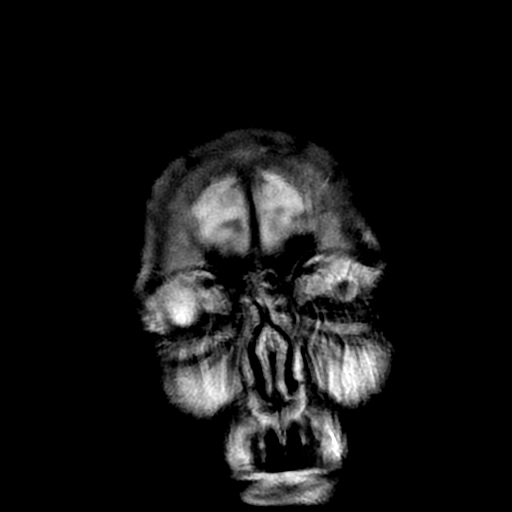

[Series 300: DWI · axial · 3.0mm · 1.09mm/px · z∈[-24,+107]mm · 5 of 45 slices shown (3 of 4)]
[im 1/45]
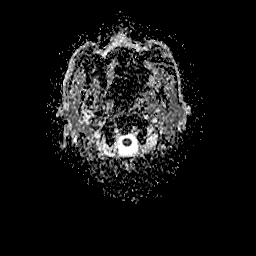
[im 12/45]
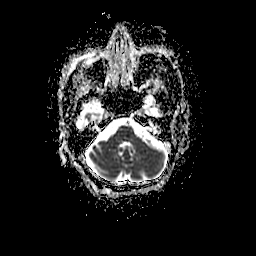
[im 23/45]
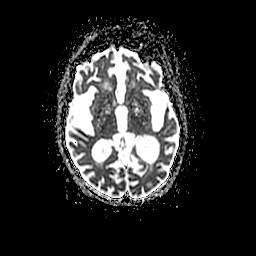
[im 34/45]
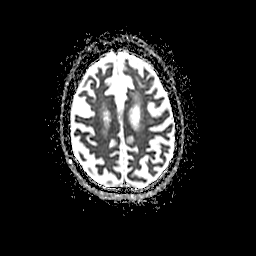
[im 45/45]
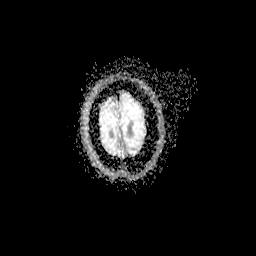

[Series 400: DWI · coronal · 5.0mm · 1.09mm/px · 4 of 33 slices shown (4 of 4)]
[im 1/33]
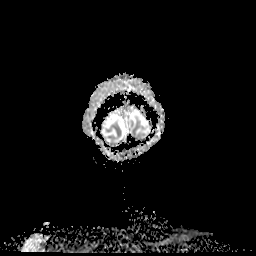
[im 11/33]
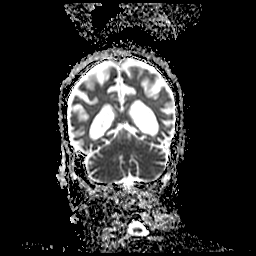
[im 22/33]
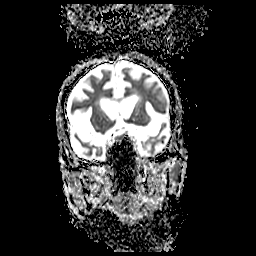
[im 33/33]
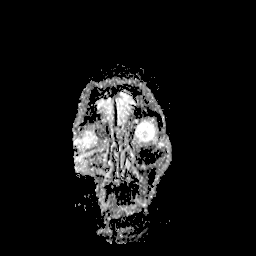

[36 of 48 positions shown; findings below may reference images not displayed]

FINDINGS: Brain: Brain shows advanced generalized atrophy. There are chronic
microvascular changes throughout the white matter, basal ganglia and
thalami. Diffusion imaging suggests a punctate acute infarction at
the left frontoparietal vertex. This may well be incidental. No
evidence of mass lesion, hemorrhage, hydrocephalus or extra-axial
collection.

Vascular: Major vessels at the base of the brain show flow.

Skull and upper cervical spine: Negative

Sinuses/Orbits: Clear/normal

Other: None
IMPRESSION: Advanced generalized atrophy. Chronic microvascular changes
throughout. Punctate focus of restricted diffusion at the left
frontoparietal vertex consistent with a tiny acute infarction. This
could well be subclinical. It seems to be in the region of the post
central gyrus.
